# Patient Record
Sex: Male | Born: 1935 | Race: White | Hispanic: No | Marital: Married | Smoking: Never smoker
Health system: Southern US, Community
[De-identification: ages and names within clinical notes are randomized; demographics above are authoritative.]

## PROBLEM LIST (undated history)

## (undated) DIAGNOSIS — I495 Sick sinus syndrome: Secondary | ICD-10-CM

## (undated) DIAGNOSIS — K8689 Other specified diseases of pancreas: Secondary | ICD-10-CM

## (undated) DIAGNOSIS — M109 Gout, unspecified: Secondary | ICD-10-CM

## (undated) DIAGNOSIS — G629 Polyneuropathy, unspecified: Secondary | ICD-10-CM

## (undated) DIAGNOSIS — I739 Peripheral vascular disease, unspecified: Secondary | ICD-10-CM

## (undated) DIAGNOSIS — N189 Chronic kidney disease, unspecified: Secondary | ICD-10-CM

## (undated) DIAGNOSIS — Z950 Presence of cardiac pacemaker: Secondary | ICD-10-CM

## (undated) DIAGNOSIS — I10 Essential (primary) hypertension: Secondary | ICD-10-CM

## (undated) DIAGNOSIS — E039 Hypothyroidism, unspecified: Secondary | ICD-10-CM

## (undated) DIAGNOSIS — K219 Gastro-esophageal reflux disease without esophagitis: Secondary | ICD-10-CM

## (undated) DIAGNOSIS — E119 Type 2 diabetes mellitus without complications: Secondary | ICD-10-CM

## (undated) DIAGNOSIS — C801 Malignant (primary) neoplasm, unspecified: Secondary | ICD-10-CM

## (undated) DIAGNOSIS — I499 Cardiac arrhythmia, unspecified: Secondary | ICD-10-CM

## (undated) DIAGNOSIS — Z8619 Personal history of other infectious and parasitic diseases: Secondary | ICD-10-CM

## (undated) HISTORY — DX: Personal history of other infectious and parasitic diseases: Z86.19

## (undated) HISTORY — PX: EYE SURGERY: SHX253

## (undated) HISTORY — PX: ANGIOPLASTY: SHX39

## (undated) HISTORY — PX: SKIN CANCER EXCISION: SHX779

---

## 1960-09-13 HISTORY — PX: APPENDECTOMY: SHX54

## 2007-09-14 DIAGNOSIS — D751 Secondary polycythemia: Secondary | ICD-10-CM | POA: Insufficient documentation

## 2007-09-15 ENCOUNTER — Ambulatory Visit: Payer: Self-pay | Admitting: Family Medicine

## 2008-03-29 ENCOUNTER — Ambulatory Visit: Payer: Self-pay | Admitting: Family Medicine

## 2008-04-08 ENCOUNTER — Ambulatory Visit: Payer: Self-pay | Admitting: Family Medicine

## 2008-07-30 ENCOUNTER — Ambulatory Visit: Payer: Self-pay | Admitting: Vascular Surgery

## 2008-09-02 ENCOUNTER — Ambulatory Visit: Payer: Self-pay | Admitting: Family Medicine

## 2008-09-13 DIAGNOSIS — IMO0002 Reserved for concepts with insufficient information to code with codable children: Secondary | ICD-10-CM | POA: Insufficient documentation

## 2008-12-27 DIAGNOSIS — I509 Heart failure, unspecified: Secondary | ICD-10-CM | POA: Insufficient documentation

## 2009-01-01 DIAGNOSIS — M109 Gout, unspecified: Secondary | ICD-10-CM | POA: Insufficient documentation

## 2009-04-07 ENCOUNTER — Ambulatory Visit: Payer: Self-pay | Admitting: Vascular Surgery

## 2009-05-20 DIAGNOSIS — M545 Low back pain, unspecified: Secondary | ICD-10-CM | POA: Insufficient documentation

## 2009-12-22 ENCOUNTER — Inpatient Hospital Stay: Payer: Self-pay | Admitting: Vascular Surgery

## 2012-04-19 ENCOUNTER — Ambulatory Visit: Payer: Self-pay | Admitting: Family Medicine

## 2013-05-13 ENCOUNTER — Inpatient Hospital Stay: Payer: Self-pay | Admitting: Internal Medicine

## 2013-05-13 LAB — COMPREHENSIVE METABOLIC PANEL
Albumin: 3.7 g/dL (ref 3.4–5.0)
Alkaline Phosphatase: 67 U/L (ref 50–136)
Anion Gap: 4 — ABNORMAL LOW (ref 7–16)
Bilirubin,Total: 1 mg/dL (ref 0.2–1.0)
Calcium, Total: 9.3 mg/dL (ref 8.5–10.1)
Creatinine: 1.93 mg/dL — ABNORMAL HIGH (ref 0.60–1.30)
EGFR (African American): 36 — ABNORMAL LOW
EGFR (Non-African Amer.): 31 — ABNORMAL LOW
Glucose: 135 mg/dL — ABNORMAL HIGH (ref 65–99)
Potassium: 4.7 mmol/L (ref 3.5–5.1)
SGPT (ALT): 18 U/L (ref 12–78)

## 2013-05-13 LAB — CBC
HCT: 47.9 % (ref 40.0–52.0)
HGB: 16.1 g/dL (ref 13.0–18.0)
MCHC: 33.7 g/dL (ref 32.0–36.0)
MCV: 96 fL (ref 80–100)
Platelet: 140 10*3/uL — ABNORMAL LOW (ref 150–440)
RBC: 4.99 10*6/uL (ref 4.40–5.90)
WBC: 11.5 10*3/uL — ABNORMAL HIGH (ref 3.8–10.6)

## 2013-05-13 LAB — URINALYSIS, COMPLETE
Glucose,UR: NEGATIVE mg/dL (ref 0–75)
Hyaline Cast: 11
Nitrite: NEGATIVE
Protein: 100
RBC,UR: 7 /HPF (ref 0–5)
WBC UR: 4 /HPF (ref 0–5)

## 2013-05-13 LAB — LIPASE, BLOOD: Lipase: 413 U/L — ABNORMAL HIGH (ref 73–393)

## 2013-05-14 LAB — COMPREHENSIVE METABOLIC PANEL
Albumin: 2.8 g/dL — ABNORMAL LOW (ref 3.4–5.0)
BUN: 28 mg/dL — ABNORMAL HIGH (ref 7–18)
Bilirubin,Total: 0.8 mg/dL (ref 0.2–1.0)
Co2: 31 mmol/L (ref 21–32)
EGFR (African American): 40 — ABNORMAL LOW
EGFR (Non-African Amer.): 34 — ABNORMAL LOW
Glucose: 114 mg/dL — ABNORMAL HIGH (ref 65–99)
Osmolality: 275 (ref 275–301)
SGPT (ALT): 15 U/L (ref 12–78)
Sodium: 134 mmol/L — ABNORMAL LOW (ref 136–145)
Total Protein: 6 g/dL — ABNORMAL LOW (ref 6.4–8.2)

## 2013-05-14 LAB — CBC WITH DIFFERENTIAL/PLATELET
Basophil #: 0 10*3/uL (ref 0.0–0.1)
Basophil %: 0.4 %
Eosinophil %: 0.5 %
HCT: 41 % (ref 40.0–52.0)
Lymphocyte #: 0.6 10*3/uL — ABNORMAL LOW (ref 1.0–3.6)
MCV: 96 fL (ref 80–100)
Monocyte #: 0.7 x10 3/mm (ref 0.2–1.0)
Monocyte %: 9.6 %
Neutrophil %: 81.6 %
Platelet: 105 10*3/uL — ABNORMAL LOW (ref 150–440)
RBC: 4.3 10*6/uL — ABNORMAL LOW (ref 4.40–5.90)
RDW: 15 % — ABNORMAL HIGH (ref 11.5–14.5)

## 2013-05-14 LAB — TROPONIN I: Troponin-I: 0.07 ng/mL — ABNORMAL HIGH

## 2013-05-16 LAB — CANCER ANTIGEN 19-9: CA 19-9: 11 U/mL (ref 0–35)

## 2013-09-13 HISTORY — PX: CAROTID ENDARTERECTOMY: SUR193

## 2013-09-13 HISTORY — PX: CATARACT EXTRACTION: SUR2

## 2013-09-13 HISTORY — PX: OTHER SURGICAL HISTORY: SHX169

## 2013-09-13 HISTORY — PX: CORONARY ANGIOPLASTY: SHX604

## 2014-07-09 ENCOUNTER — Ambulatory Visit: Payer: Self-pay | Admitting: Vascular Surgery

## 2014-07-21 ENCOUNTER — Emergency Department: Payer: Self-pay | Admitting: Emergency Medicine

## 2014-07-21 LAB — CBC WITH DIFFERENTIAL/PLATELET
Basophil #: 0 10*3/uL (ref 0.0–0.1)
Basophil %: 0.6 %
EOS PCT: 0.9 %
Eosinophil #: 0.1 10*3/uL (ref 0.0–0.7)
HCT: 45.6 % (ref 40.0–52.0)
HGB: 14.9 g/dL (ref 13.0–18.0)
Lymphocyte #: 0.8 10*3/uL — ABNORMAL LOW (ref 1.0–3.6)
Lymphocyte %: 13.5 %
MCH: 32.8 pg (ref 26.0–34.0)
MCHC: 32.6 g/dL (ref 32.0–36.0)
MCV: 101 fL — ABNORMAL HIGH (ref 80–100)
MONOS PCT: 6.3 %
Monocyte #: 0.4 x10 3/mm (ref 0.2–1.0)
NEUTROS PCT: 78.7 %
Neutrophil #: 4.9 10*3/uL (ref 1.4–6.5)
Platelet: 161 10*3/uL (ref 150–440)
RBC: 4.53 10*6/uL (ref 4.40–5.90)
RDW: 14 % (ref 11.5–14.5)
WBC: 6.2 10*3/uL (ref 3.8–10.6)

## 2014-07-21 LAB — COMPREHENSIVE METABOLIC PANEL
ALK PHOS: 77 U/L
AST: 22 U/L (ref 15–37)
Albumin: 3.7 g/dL (ref 3.4–5.0)
Anion Gap: 6 — ABNORMAL LOW (ref 7–16)
BUN: 23 mg/dL — ABNORMAL HIGH (ref 7–18)
Bilirubin,Total: 0.5 mg/dL (ref 0.2–1.0)
CALCIUM: 8.2 mg/dL — AB (ref 8.5–10.1)
Chloride: 94 mmol/L — ABNORMAL LOW (ref 98–107)
Co2: 35 mmol/L — ABNORMAL HIGH (ref 21–32)
Creatinine: 2.26 mg/dL — ABNORMAL HIGH (ref 0.60–1.30)
EGFR (African American): 36 — ABNORMAL LOW
EGFR (Non-African Amer.): 29 — ABNORMAL LOW
GLUCOSE: 278 mg/dL — AB (ref 65–99)
Osmolality: 284 (ref 275–301)
Potassium: 4.2 mmol/L (ref 3.5–5.1)
SGPT (ALT): 24 U/L
SODIUM: 135 mmol/L — AB (ref 136–145)
Total Protein: 6.6 g/dL (ref 6.4–8.2)

## 2014-07-21 LAB — URINALYSIS, COMPLETE
BACTERIA: NONE SEEN
BILIRUBIN, UR: NEGATIVE
Blood: NEGATIVE
Ketone: NEGATIVE
Leukocyte Esterase: NEGATIVE
Nitrite: NEGATIVE
Ph: 6 (ref 4.5–8.0)
Protein: NEGATIVE
RBC,UR: 1 /HPF (ref 0–5)
SQUAMOUS EPITHELIAL: NONE SEEN
Specific Gravity: 1.01 (ref 1.003–1.030)
WBC UR: 2 /HPF (ref 0–5)

## 2014-07-21 LAB — LIPASE, BLOOD: Lipase: 217 U/L (ref 73–393)

## 2014-07-21 LAB — TROPONIN I: Troponin-I: 0.07 ng/mL — ABNORMAL HIGH

## 2014-07-29 ENCOUNTER — Ambulatory Visit: Payer: Self-pay | Admitting: Cardiology

## 2014-08-07 ENCOUNTER — Ambulatory Visit: Payer: Self-pay | Admitting: Vascular Surgery

## 2014-08-07 LAB — POTASSIUM: Potassium: 4.7 mmol/L (ref 3.5–5.1)

## 2014-08-15 ENCOUNTER — Inpatient Hospital Stay: Payer: Self-pay | Admitting: Vascular Surgery

## 2014-08-16 LAB — BASIC METABOLIC PANEL
Anion Gap: 8 (ref 7–16)
BUN: 31 mg/dL — AB (ref 7–18)
CO2: 30 mmol/L (ref 21–32)
CREATININE: 2.16 mg/dL — AB (ref 0.60–1.30)
Calcium, Total: 7.9 mg/dL — ABNORMAL LOW (ref 8.5–10.1)
Chloride: 98 mmol/L (ref 98–107)
EGFR (African American): 37 — ABNORMAL LOW
GFR CALC NON AF AMER: 31 — AB
Glucose: 203 mg/dL — ABNORMAL HIGH (ref 65–99)
Osmolality: 284 (ref 275–301)
POTASSIUM: 4.8 mmol/L (ref 3.5–5.1)
SODIUM: 136 mmol/L (ref 136–145)

## 2014-08-16 LAB — CBC WITH DIFFERENTIAL/PLATELET
Basophil #: 0 10*3/uL (ref 0.0–0.1)
Basophil %: 0.3 %
EOS ABS: 0 10*3/uL (ref 0.0–0.7)
Eosinophil %: 0.4 %
HCT: 40.6 % (ref 40.0–52.0)
HGB: 13 g/dL (ref 13.0–18.0)
Lymphocyte #: 0.5 10*3/uL — ABNORMAL LOW (ref 1.0–3.6)
Lymphocyte %: 7.1 %
MCH: 32.5 pg (ref 26.0–34.0)
MCHC: 31.9 g/dL — ABNORMAL LOW (ref 32.0–36.0)
MCV: 102 fL — ABNORMAL HIGH (ref 80–100)
Monocyte #: 0.5 x10 3/mm (ref 0.2–1.0)
Monocyte %: 6.9 %
Neutrophil #: 6.4 10*3/uL (ref 1.4–6.5)
Neutrophil %: 85.3 %
Platelet: 102 10*3/uL — ABNORMAL LOW (ref 150–440)
RBC: 3.99 10*6/uL — AB (ref 4.40–5.90)
RDW: 13.8 % (ref 11.5–14.5)
WBC: 7.5 10*3/uL (ref 3.8–10.6)

## 2014-08-16 LAB — PROTIME-INR
INR: 1
Prothrombin Time: 13.5 secs (ref 11.5–14.7)

## 2014-08-16 LAB — APTT: Activated PTT: 29.7 secs (ref 23.6–35.9)

## 2014-11-06 DIAGNOSIS — D0421 Carcinoma in situ of skin of right ear and external auricular canal: Secondary | ICD-10-CM | POA: Diagnosis not present

## 2014-11-06 DIAGNOSIS — L570 Actinic keratosis: Secondary | ICD-10-CM | POA: Diagnosis not present

## 2014-11-06 DIAGNOSIS — D099 Carcinoma in situ, unspecified: Secondary | ICD-10-CM | POA: Diagnosis not present

## 2014-11-06 DIAGNOSIS — L98499 Non-pressure chronic ulcer of skin of other sites with unspecified severity: Secondary | ICD-10-CM | POA: Diagnosis not present

## 2014-11-06 DIAGNOSIS — D485 Neoplasm of uncertain behavior of skin: Secondary | ICD-10-CM | POA: Diagnosis not present

## 2014-11-12 DIAGNOSIS — I509 Heart failure, unspecified: Secondary | ICD-10-CM | POA: Diagnosis not present

## 2014-11-12 DIAGNOSIS — I10 Essential (primary) hypertension: Secondary | ICD-10-CM | POA: Diagnosis not present

## 2014-11-12 DIAGNOSIS — N183 Chronic kidney disease, stage 3 (moderate): Secondary | ICD-10-CM | POA: Diagnosis not present

## 2014-11-12 DIAGNOSIS — E1149 Type 2 diabetes mellitus with other diabetic neurological complication: Secondary | ICD-10-CM | POA: Diagnosis not present

## 2014-11-12 DIAGNOSIS — E1140 Type 2 diabetes mellitus with diabetic neuropathy, unspecified: Secondary | ICD-10-CM | POA: Diagnosis not present

## 2014-11-13 DIAGNOSIS — E039 Hypothyroidism, unspecified: Secondary | ICD-10-CM | POA: Diagnosis not present

## 2014-11-13 DIAGNOSIS — N183 Chronic kidney disease, stage 3 (moderate): Secondary | ICD-10-CM | POA: Diagnosis not present

## 2014-11-13 DIAGNOSIS — E780 Pure hypercholesterolemia: Secondary | ICD-10-CM | POA: Diagnosis not present

## 2014-11-13 LAB — LIPID PANEL
Cholesterol: 132 mg/dL (ref 0–200)
HDL: 46 mg/dL (ref 35–70)
LDL Cholesterol: 60 mg/dL
Triglycerides: 131 mg/dL (ref 40–160)

## 2014-11-13 LAB — BASIC METABOLIC PANEL
BUN: 24 mg/dL — AB (ref 4–21)
Creatinine: 1.8 mg/dL — AB (ref 0.6–1.3)
Glucose: 151 mg/dL
Sodium: 139 mmol/L (ref 137–147)

## 2014-11-13 LAB — TSH: TSH: 4.59 u[IU]/mL (ref 0.41–5.90)

## 2015-01-01 DIAGNOSIS — D0421 Carcinoma in situ of skin of right ear and external auricular canal: Secondary | ICD-10-CM | POA: Diagnosis not present

## 2015-01-01 DIAGNOSIS — L578 Other skin changes due to chronic exposure to nonionizing radiation: Secondary | ICD-10-CM | POA: Diagnosis not present

## 2015-01-01 DIAGNOSIS — D044 Carcinoma in situ of skin of scalp and neck: Secondary | ICD-10-CM | POA: Diagnosis not present

## 2015-01-01 DIAGNOSIS — D485 Neoplasm of uncertain behavior of skin: Secondary | ICD-10-CM | POA: Diagnosis not present

## 2015-01-01 DIAGNOSIS — L570 Actinic keratosis: Secondary | ICD-10-CM | POA: Diagnosis not present

## 2015-01-03 NOTE — H&P (Signed)
PATIENT NAME:  [name] MR#:  [id number] DATE OF BIRTH:  06/23/1925  DATE OF ADMISSION:  05/13/2013  PRIMARY CARE PHYSICIAN: Dr. Caryn Section.  CHIEF COMPLAINT: Abdominal pain.   HISTORY OF PRESENT ILLNESS: This is an 81 year old male who Friday started having some epigastric pain and bloating. He had no nausea and vomiting with this. He took extra Zantac, with little relief. It was worse today. He comes in. He was found to have pancreatitis by a CT scan and labs.   He has had no history of this before. He does not drink alcohol, and no history of gallstones. We are going to admit him for further treatment.   PAST MEDICAL HISTORY: 1.  Peripheral vascular disease.  2.  Non-insulin-dependent diabetes.  3.  Gout.  4.  Hypertension.  5.  Hyperlipidemia.  6.  Hypothyroidism.  7.  GERD.   PAST SURGICAL HISTORY: Left popliteal stent, and appendectomy.   ALLERGIES: PENICILLIN.   SOCIAL HISTORY: Does not smoke and does not drink alcohol.   FAMILY HISTORY: Significant for diabetes.   REVIEW OF SYSTEMS:  CONSTITUTIONAL: No fever or chills.  EYES: No blurred vision.  ENT: No hearing loss.  CARDIOVASCULAR: No chest pain.  PULMONARY: No shortness of breath.  GASTROINTESTINAL: He has had abdominal pain.  GENITOURINARY: No dysuria.  ENDOCRINE: No heat or cold intolerance.  INTEGUMENTARY: No rash.  MUSCULOSKELETAL: Occasional joint pain.  NEUROLOGIC: No numbness or weakness.   PHYSICAL EXAMINATION: VITAL SIGNS: Temperature is 98, pulse 85, respirations 16, blood pressure 135/81.  GENERAL: This is a well-nourished white male in no acute distress.  HEENT: The pupils are equal, round, and reactive to light. Sclerae nonicteric. Oral mucosa is moist. Oropharynx clear. Nasopharynx is clear.  NECK: Supple. No JVD, lymphadenopathy or thyromegaly.  CARDIOVASCULAR: Regular rate and rhythm. There is a 1/6 systolic murmur.  LUNGS: Clear to auscultation. No dullness to percussion. He is not using  accessory muscles.  ABDOMEN: Mildly distended. There is some tenderness in the epigastric area. No rebound or guarding. There is no hepatosplenomegaly. No masses.  EXTREMITIES: There is trace lower extremity edema. No joint deformity.  NEUROLOGIC: Cranial nerves II-XII are intact. He is alert and oriented x 4.  SKIN: Moist, with no rash.   CT scan shows pancreatitis that was done without contrast.   White blood cells 11.5, hemoglobin 16, BUN is 32, creatinine 1.93, sodium 133, lipase is 413, troponin 0.06.   EKG shows a left bundle branch block, and possible a-fib.   ASSESSMENT AND PLAN: 1.  Acute pancreatitis: Etiology is unknown at this point. He does not have a history of alcohol use and has never had gallstones. CT scan did not show any gallbladder abnormalities, but it was done without contrast. Will go ahead and treat him aggressively with IV fluids; pain medicines if needed. Make him n.p.o. If his renal function improves will consider doing a CT scan with contrast to further evaluate. Have to be concerned about pancreatic cancer in this age group.  2.  Acute renal failure: This is likely dehydration. Will hydrate him with IV fluids. I am going to hold his metformin, his hydrochlorothiazide, and his ACE inhibitor at this point.  3.  Abnormal EKG: The patient states he has had an abnormal EKG for years. I do not have an old one to compare to. I do not see anything acute on this EKG at this point. His troponin is just barely above normal limits. Will put him on telemetry and  go ahead and cycle some enzymes. Will try get an old EKG from his primary care doctor's office. Will consult cardiology if needed.  4.  Noninsulin-dependent diabetes: Holding his diabetic medications for now, and will put him on sliding-scale.   Time spent on admission was sixty minutes.     ____________________________ Baxter Hire, MD jdj:dm D: 05/13/2013 22:41:01 ET T: 05/14/2013 08:10:24  ET JOB#: [id number]  cc: Baxter Hire, MD, <Dictator> Kirstie Peri. Caryn Section, MD Baxter Hire MD ELECTRONICALLY SIGNED 05/23/2013 9:42

## 2015-01-03 NOTE — Discharge Summary (Signed)
PATIENT NAME:  [name], [name] MR#:  [id number] DATE OF BIRTH:  1925/06/17  DATE OF ADMISSION:  05/13/2013 DATE OF DISCHARGE:  05/15/2013  DISCHARGE DIAGNOSES: 1.  Acute pancreatitis, idiopathic origin.  2.  Chronic kidney disease stage III.  3.  Peripheral vascular disease.  4.  Insulin-dependent diabetes mellitus.  5.  Hypertension.  6.  Hyperlipidemia.  7.  Hypothyroidism.  8.  Gastroesophageal reflux disease.  9.  Abnormal EKG which is chronic.  DISCHARGE MEDICATIONS:  Allopurinol 100 mg p.o. daily, aspirin 81 mg p.o. daily, bumetanide 2 mg 2 tablets as needed for swelling, fish oil 2 grams daily, Neurontin 300 mg at bedtime, garlic 1 gram daily, glipizide 2.5 mg p.o. b.i.d., HCTZ/lisinopril 25/20 half tablet once a day, levothyroxine 112 mcg p.o. daily, Plavix 75 mg p.o. daily, pravastatin 20 mg p.o. daily, ranitidine 150 mg p.o. b.i.d., Singulair 10 mg p.o. daily, vitamin B12, 1000 mcg injection  every month. Metformin has been stopped because of CKD stage III.  HOSPITAL COURSE:  1.  This is an 81 year old male patient with history of hypertension, diabetes, CKD stage III, comes in because of abdominal pain and bloating. The patient has been having these symptoms for 3 days, and the pain was around 10 out of 10 in severity, epigastric pain, but no radiation to the back The patient's CAT scan of the abdomen showed pancreatitis, but no gallstones. The patient's lipase on admission was 413. The patient was admitted to the hospitalist service for pancreatitis. He had LFTs, which were within normal limits. A, AST 23, and his  totAL BILIRUBIN l was 1.   ct abdomen  showed 1. Findings suggestive of acute pancreatitis. There may be some  underlying gastritis or thickening the stomach secondary to pancreatitis.  Atherosclerotic disease with infrarenal suprailiac abdominal aortic  aneurysm. Fat filled right inguinal hernia. Scattered colonic  diverticulosis without definite evidence of acute  diverticulitis.    The patient's symptoms improved with n.p.o., IV fluid status. Lipase came back normal at 374 on September 1st. Soft diet. The patient initially started on liquid diet yesterday morning on September 1st and he advanced to regular diet, tolerating the diet. Did not have any more pain. No nausea. The patient really wants to go home, and initially the patient's  white count was 11.5, but today it is 7.1.  The cause of pancreatitis is unclear at this time, because he has no gallstones. The patient's lipid panel ordered and also CA 99 is ordered to evaluate for malignancy at this age, but they are pending. Anyway, the patient is doing better. The patient can have the followup with the primary doctor as an outpatient for these problems.  2.  Chronic kidney disease stage III.  The patient is not aware of any kidney problem, but the patient's  GFR is around 30 and BUN 32 and creatinine 1.93 on admission. Today, it is 28 and 1.75.  He has acute on chronic renal failure secondary to dehydration but he has CKD stage III, diabetes and hypertension.  3.  Abnormal EKG. The patient's EKG was questionable for any conduction abnormalities on admission. EKG results from his primary doctor obtained from Dr. Maralyn Sago office.  There are 2  EKGs, one done on August of last year which showed same findings like the one that he had in the hospital. The patient did have a left bundle branch block. The EKG here also showed LBB. The patient mentioned the patient does have some problems like that, but  never had chest pain and was evaluated by cardiologist before for this, and the patient's echo was normal. According to him, he did not have any intervention done and this conduction abnormality is not new for him. Troponins were 0.07 and then the second one 0.06. The patient had no chest pain, today symptom-free. No abdominal pain. The patient is tolerating the diet well. I told him to stop the metformin because of his age  and CKD stage III. He can continue glipizide and follow with his primary doctor regarding blood sugar control and management. The patient's condition is stable.   TIME SPENT ON DISCHARGE PREPARATION: More than 30 minutes.   VITAL SIGNS:  Stable at the time of discharge.   ____________________________ Epifanio Lesches, MD sk:dmm D: 05/15/2013 11:51:48 ET T: 05/15/2013 12:20:33 ET JOB#: [id number]  cc: Epifanio Lesches, MD, <Dictator> Epifanio Lesches MD ELECTRONICALLY SIGNED 05/31/2013 23:10

## 2015-01-04 NOTE — Op Note (Signed)
PATIENT NAME:  [name], [name] MR#:  [id number] DATE OF BIRTH:  05-09-1925  DATE OF PROCEDURE:  08/15/2014  PREOPERATIVE DIAGNOSES:  1. High-grade bilateral carotid artery stenosis, right greater than left.  2. Peripheral arterial disease.  3. Coronary disease.  4. Hyperlipidemia.  5. Hypertension.  6. Diabetes.   POSTOPERATIVE DIAGNOSES: 1. High-grade bilateral carotid artery stenosis, right greater than left.  2. Peripheral arterial disease.  3. Coronary disease.  4. Hyperlipidemia.  5. Hypertension.  6. Diabetes.   PROCEDURE: Right carotid endarterectomy.   SURGEON: [name], MD   ANESTHESIA: General.   ESTIMATED BLOOD LOSS: 25 mL.   INDICATIONS FOR PROCEDURE: This is an 81 year old gentleman with recent findings of bilateral high-grade carotid artery stenosis. The right side is the worse of the 2 sides. The left side is on the borderline of high-grade for moderate disease. He does not have any focal symptoms. He is brought in for endarterectomy on the right side for his very high-grade stenosis, and pending how he does through this, we may consider endarterectomy on the left side in the future as well. Risks and benefits were discussed. Informed consent was obtained.   DESCRIPTION OF PROCEDURE: The patient is brought to the operative suite and after an adequate level of general anesthesia was obtained, the patient was placed in modified beach chair position. A roll was placed under his shoulders, his head was flexed and turned to the left. His neck was then sterilely prepped and draped, and a sterile surgical field was created. An incision was created along the anterior border of the sternocleidomastoid and dissected down through the platysma with electrocautery. The sternocleidomastoid was then retracted laterally. Weitlaner retractor was used to help facilitate our exposure. The facial vein was ligated and divided between silk ties as well as a crossing superficial vein. This  exposed the carotid bifurcation. The internal carotid artery distal to the lesion, external carotid artery, superior thyroid artery and common carotid artery were all dissected out and encircled with vessel loops. The patient was given 6000 units of intravenous heparin for systemic anticoagulation. This was allowed to circulate for 4 minutes. Control was then pulled up on the vessel loops, and an anterior wall arteriotomy was created with an 11 blade and extended with Potts scissors. The Pru[place] shunt was then placed, first in the internal carotid artery, flushed and de-aired, then in the common carotid artery; and flow was then restored. Between 60 and 90 seconds elapsed between clamping and restoration of flow.  I then performed an endarterectomy in the typical fashion. Eversion endarterectomy was performed on the external carotid artery. The proximal endpoint was cut flush with tenotomy scissors. A nice feathered distal endpoint was created with gentle traction. All loose flecks were removed. The vessel was locally heparinized. Due to the generous nature of his artery and his advanced age, I felt a primary closure would be warranted. I tacked down the distal endpoint with two 7-0 Prolene and started the distal endpoint of the arteriotomy with a 6-0 Prolene, running one-half the length of the arteriotomy. A second 6-0 Prolene was started at the proximal endpoint and run until the shunt needed to be removed. The shunt was then removed first in the internal and then from the common carotid arteries. The vessel was flushed in the internal, external and common carotid arteries and locally heparinized. I then completed the arteriotomy, flushing through the external carotid artery and allowing several cardiac cycles to traverse up the external carotid artery  prior to release of control. Approximately 2 minutes elapsed from shunt removal to restoration of flow to the brain. A single 6-0 Prolene patch suture was  used for hemostasis, and hemostasis was achieved. The wound was then irrigated. Surgicel and Evicel topical hemostatic agents were placed. I then closed the wound with 3 interrupted 3-0 Vicryl sutures in the sternocleidomastoid space. The platysma was closed with a running 3-0 Vicryl. The skin was closed with a 4-0 Monocryl. Dermabond was placed as a dressing. The patient was awakened from anesthesia and taken to the recovery room in stable condition, having tolerated the procedure well.    ____________________________ [name], MD jsd:je D: 08/15/2014 14:54:45 ET T: 08/15/2014 15:17:51 ET JOB#: [id number]  cc: [name], MD, <Dictator> Kirstie Peri. Caryn Section, MD [name] MD ELECTRONICALLY SIGNED 09/12/2014 14:13

## 2015-01-04 NOTE — Consult Note (Signed)
   Present Illness 81 yo male with history of htn, hyperlipidemia, DM who is s/p R CEA. Consultation requested to evaluate irregular heart rate.Preop, underwent a functional study followed by cardiac cath which revealed no evidence of significant coronary disease. He had irregular heart rate as outpatient with pacs, pvcx etc. He had realtive bradycardia as outpatient as well. He denies syncope. Post op has done well other than frequent ectopy on monitor. Asymptomatic at present. Deneis chest pain or dizzyness.   Physical Exam:  GEN no acute distress   HEENT PERRL   RESP normal resp effort  clear BS   CARD Irregular rate and rhythm  Murmur   Murmur Systolic   Systolic Murmur axilla   ABD denies tenderness  normal BS   LYMPH negative neck   EXTR negative cyanosis/clubbing, negative edema   SKIN normal to palpation   NEURO cranial nerves intact, motor/sensory function intact   PSYCH A+O to time, place, person   Review of Systems:  Subjective/Chief Complaint mild neck discomfort at surgical site.   General: No Complaints   Skin: No Complaints   ENT: No Complaints   Eyes: No Complaints   Neck: Pain  at surgical site.   Respiratory: No Complaints   Cardiovascular: Palpitations   Gastrointestinal: No Complaints   Genitourinary: No Complaints   Vascular: No Complaints   Musculoskeletal: No Complaints   Neurologic: No Complaints   Hematologic: No Complaints   Endocrine: No Complaints   Psychiatric: No Complaints   Review of Systems: All other systems were reviewed and found to be negative   Medications/Allergies Reviewed Medications/Allergies reviewed   EKG:  Interpretation sinus arrythmia, pacs and pvcs.    PCN: Rash  Penicillin: Swelling   Impression 81  yo male with history of insignficant cad by recent cath who is s/p r cea. Realtive hypotension with frequent ectopy on ekg and telemetry. Has no symptoms at present. Had similar findings on ekg and  monitors as outpatient. K is normal. WOuld agree with discharge with no further cardiac workup. Can folow up in our office after discharge. OK for discharge from cardiac standpoint.   Plan 1. Agree with holding ace i for now. WIll likel need this back after discharge and recovery from surgery 2. OK for discharge from cardiac standpoint 3. WIll follow up as outpatient if desired.   Electronic Signatures: [name] (MD)  (Signed 04-Dec-15 12:49)  Authored: General Aspect/Present Illness, History and Physical Exam, Review of System, EKG , Allergies, Impression/Plan   Last Updated: 04-Dec-15 12:49 by [name] (MD)

## 2015-01-06 LAB — SURGICAL PATHOLOGY

## 2015-01-10 DIAGNOSIS — E1149 Type 2 diabetes mellitus with other diabetic neurological complication: Secondary | ICD-10-CM | POA: Diagnosis not present

## 2015-01-10 DIAGNOSIS — L98499 Non-pressure chronic ulcer of skin of other sites with unspecified severity: Secondary | ICD-10-CM | POA: Diagnosis not present

## 2015-01-13 DIAGNOSIS — Z85828 Personal history of other malignant neoplasm of skin: Secondary | ICD-10-CM | POA: Diagnosis not present

## 2015-01-13 DIAGNOSIS — L905 Scar conditions and fibrosis of skin: Secondary | ICD-10-CM | POA: Diagnosis not present

## 2015-01-21 DIAGNOSIS — I10 Essential (primary) hypertension: Secondary | ICD-10-CM | POA: Diagnosis not present

## 2015-01-21 DIAGNOSIS — E509 Vitamin A deficiency, unspecified: Secondary | ICD-10-CM | POA: Diagnosis not present

## 2015-01-21 DIAGNOSIS — I739 Peripheral vascular disease, unspecified: Secondary | ICD-10-CM | POA: Diagnosis not present

## 2015-01-21 DIAGNOSIS — N183 Chronic kidney disease, stage 3 (moderate): Secondary | ICD-10-CM | POA: Diagnosis not present

## 2015-01-21 DIAGNOSIS — Z0000 Encounter for general adult medical examination without abnormal findings: Secondary | ICD-10-CM | POA: Diagnosis not present

## 2015-01-21 DIAGNOSIS — I509 Heart failure, unspecified: Secondary | ICD-10-CM | POA: Diagnosis not present

## 2015-01-29 ENCOUNTER — Other Ambulatory Visit
Admission: RE | Admit: 2015-01-29 | Discharge: 2015-01-29 | Disposition: A | Discharge status: Home / Self Care | Payer: Medicare Other | Source: Ambulatory Visit | Attending: Surgery | Admitting: Surgery

## 2015-01-29 ENCOUNTER — Encounter: Payer: Medicare Other | Attending: Surgery | Admitting: Surgery

## 2015-01-29 DIAGNOSIS — I509 Heart failure, unspecified: Secondary | ICD-10-CM | POA: Diagnosis not present

## 2015-01-29 DIAGNOSIS — E1122 Type 2 diabetes mellitus with diabetic chronic kidney disease: Secondary | ICD-10-CM | POA: Insufficient documentation

## 2015-01-29 DIAGNOSIS — I129 Hypertensive chronic kidney disease with stage 1 through stage 4 chronic kidney disease, or unspecified chronic kidney disease: Secondary | ICD-10-CM | POA: Diagnosis not present

## 2015-01-29 DIAGNOSIS — X58XXXD Exposure to other specified factors, subsequent encounter: Secondary | ICD-10-CM | POA: Insufficient documentation

## 2015-01-29 DIAGNOSIS — S81801D Unspecified open wound, right lower leg, subsequent encounter: Secondary | ICD-10-CM | POA: Insufficient documentation

## 2015-01-29 DIAGNOSIS — R600 Localized edema: Secondary | ICD-10-CM | POA: Diagnosis not present

## 2015-01-29 DIAGNOSIS — E11622 Type 2 diabetes mellitus with other skin ulcer: Secondary | ICD-10-CM | POA: Insufficient documentation

## 2015-01-29 DIAGNOSIS — E1143 Type 2 diabetes mellitus with diabetic autonomic (poly)neuropathy: Secondary | ICD-10-CM | POA: Insufficient documentation

## 2015-01-29 DIAGNOSIS — I70632 Atherosclerosis of nonbiological bypass graft(s) of the right leg with ulceration of calf: Secondary | ICD-10-CM | POA: Insufficient documentation

## 2015-01-29 DIAGNOSIS — I482 Chronic atrial fibrillation: Secondary | ICD-10-CM | POA: Diagnosis not present

## 2015-01-29 DIAGNOSIS — L03115 Cellulitis of right lower limb: Secondary | ICD-10-CM | POA: Insufficient documentation

## 2015-01-29 DIAGNOSIS — L97219 Non-pressure chronic ulcer of right calf with unspecified severity: Secondary | ICD-10-CM | POA: Diagnosis not present

## 2015-01-29 DIAGNOSIS — I739 Peripheral vascular disease, unspecified: Secondary | ICD-10-CM | POA: Diagnosis not present

## 2015-01-30 DIAGNOSIS — L97909 Non-pressure chronic ulcer of unspecified part of unspecified lower leg with unspecified severity: Secondary | ICD-10-CM | POA: Diagnosis not present

## 2015-01-30 NOTE — Progress Notes (Signed)
MIRL, HILLERY ([id number]) Visit Report for 01/29/2015 Chief Complaint Document Details Patient Name: [name], [name]. Date of Service: 01/29/2015 2:30 PM Medical Record Number: [id number] Patient Account Number: [id number] Date of Birth/Sex: 1924-09-28 (81 y.o. Male) Treating RN: Primary Care Physician: Lelon Huh Other Clinician: Referring Physician: Treating Physician/Extender: BURNS, Charlean Sanfilippo in Treatment: 0 Information Obtained from: Patient Chief Complaint Right calf ulcer. Arterial insufficiency. Electronic Signature(s) Signed: 01/29/2015 1:50:02 PM By: Loletha Grayer MD Entered By: Loletha Grayer on 01/29/2015 15:41:03 [name], [name] ([id number]) -------------------------------------------------------------------------------- Debridement Details Patient Name: [name]. Date of Service: 01/29/2015 2:30 PM Medical Record Number: [id number] Patient Account Number: [id number] Date of Birth/Sex: [birth year] (81 y.o. Male) Treating RN: Primary Care Physician: Lelon Huh Other Clinician: Referring Physician: Treating Physician/Extender: BURNS, Charlean Sanfilippo in Treatment: 0 Debridement Performed for Wound #1 Right,Medial Lower Leg Assessment: Performed By: Physician BURNS, Teressa Senter., MD Debridement: Open Wound/Selective Debridement Selective Description: Pre-procedure Yes Verification/Time Out Taken: Start Time: 15:31 Pain Control: Lidocaine 4% Topical Solution Level: Non-Viable Tissue Total Area Debrided (L x 1.1 (cm) x 3.5 (cm) = 3.85 (cm) W): Tissue and other Non-Viable, Eschar, Exudate, Fibrin/Slough, Subcutaneous material debrided: Instrument: Curette Bleeding: Minimum Hemostasis Achieved: Pressure End Time: 15:33 Procedural Pain: 0 Post Procedural Pain: 0 Response to Treatment: Procedure was tolerated well Post Debridement Measurements of Total Wound Length: (cm) 1.1 Width: (cm) 3.5 Depth: (cm) 0.2 Volume: (cm)  0.605 Notes Biopsy culture obtained today. Electronic Signature(s) Signed: 01/29/2015 1:50:02 PM By: Loletha Grayer MD Entered By: Loletha Grayer on 01/29/2015 15:40:48 [name], [name] ([id number]) -------------------------------------------------------------------------------- HPI Details Patient Name: [name]. Date of Service: 01/29/2015 2:30 PM Medical Record Number: [id number] Patient Account Number: [id number] Date of Birth/Sex: 10-18-1924 (81 y.o. Male) Treating RN: Primary Care Physician: Lelon Huh Other Clinician: Referring Physician: Treating Physician/Extender: BURNS, Charlean Sanfilippo in Treatment: 0 History of Present Illness HPI Description: Pleasant 81 year old with history of diabetes (hemoglobin A1c 8.6 in April 2016), peripheral neuropathy, peripheral vascular disease (status post bilateral lower extremity stents per patient), atrial fibrillation, and chronic kidney disease. He developed a blister on his right medial calf in April 2016 and a subsequent ulceration. No significant improvement with Silvadene. Currently applying Neosporin. He reports mild pain with pressure. No claudication or rest pain. Ambulating normally per his baseline. Unable to obtain an ABI. Has seen Dr. Lucky Cowboy in the past and is scheduled to see him again in June. No antibiotics. Denies fever or chills. No significant drainage. Electronic Signature(s) Signed: 01/29/2015 1:50:02 PM By: Loletha Grayer MD Entered By: Loletha Grayer on 01/29/2015 15:43:18 [name], [name] ([id number]) -------------------------------------------------------------------------------- Physical Exam Details Patient Name: [name]. Date of Service: 01/29/2015 2:30 PM Medical Record Number: [id number] Patient Account Number: [id number] Date of Birth/Sex: 1925-02-24 (81 y.o. Male) Treating RN: Primary Care Physician: Lelon Huh Other Clinician: Referring Physician: Treating  Physician/Extender: BURNS, Charlean Sanfilippo in Treatment: 0 Constitutional . Pulse regular. Respirations normal and unlabored. Afebrile. Marland Kitchen Respiratory WNL. No retractions.. Cardiovascular . Integumentary (Hair, Skin) .Marland Kitchen Neurological . Psychiatric Judgement and insight Intact.. Oriented times 3.. No evidence of depression, anxiety, or agitation.. Notes Right medial calf ulceration. Full-thickness. Eschar and underlying biofilm debrided. Biopsy culture obtained. Mild surrounding erythema and cellulitis. Tender. 1+ pitting edema, localized. No palpable pedal pulses. No dopplerable DP. Faintly dopplerable, monophasic PT. No toe signal. Foot cool to touch. Capillary refill greater than 1 second. Electronic Signature(s) Signed: 01/29/2015 1:50:02 PM By: Loletha Grayer MD Entered By: Quay Burow  IIIThayer Jew on 01/29/2015 15:44:54 [name], [name] ([id number]) -------------------------------------------------------------------------------- Physician Orders Details Patient Name: Bridges, [name]. Date of Service: 01/29/2015 2:30 PM Medical Record Number: [id number] Patient Account Number: [id number] Date of Birth/Sex: 1925-02-11 (81 y.o. Male) Treating RN: Baruch Gouty, RN, BSN, Velva Harman Primary Care Physician: Lelon Huh Other Clinician: Referring Physician: Treating Physician/Extender: BURNS, Charlean Sanfilippo in Treatment: 0 Verbal / Phone Orders: Yes Clinician: Afful, RN, BSN, Rita Read Back and Verified: Yes Diagnosis Coding Wound Cleansing Wound #1 Right,Medial Lower Leg o Cleanse wound with mild soap and water o May Shower, gently pat wound dry prior to applying new dressing. o May shower with protection. Primary Wound Dressing Wound #1 Right,Medial Lower Leg o Prisma Ag Secondary Dressing Wound #1 Right,Medial Lower Leg o Boardered Foam Dressing Dressing Change Frequency Wound #1 Right,Medial Lower Leg o Change dressing every other day. Follow-up Appointments Wound #1  Right,Medial Lower Leg o Return Appointment in 1 week. Edema Control Wound #1 Right,Medial Lower Leg o Tubigrip Additional Orders / Instructions Wound #1 Right,Medial Lower Leg o Increase protein intake. o Activity as tolerated Medications-please add to medication list. Wound #1 Right,Medial Lower Leg o P.O. Antibiotics - Doxycyline 100 PO BID x 1 week [name], [name] ([id number]) Laboratory o Culture and Sensitivity - right lower leg oooo Patient Medications Allergies: Crestor- Hyperlipidemics, Penicillins Notifications Medication Indication Start End doxycycline monohydrate 01/29/2015 DOSE oral 100 mg capsule - capsule oral Electronic Signature(s) Signed: 01/29/2015 1:50:02 PM By: Loletha Grayer MD Signed: 01/29/2015 5:13:49 PM By: Regan Lemming BSN, RN Entered By: Regan Lemming on 01/29/2015 15:36:29 [name], [name] ([id number]) -------------------------------------------------------------------------------- Prescription 01/29/2015 Patient Name: [name] Physician: Kandy Garrison MD Date of Birth: Dec 17, 1924 NPI#: [id number] Sex: M DEA#: Phone #: [phone] License #: [facility] [address], [place], [place] [address] [id number] Allergies Crestor- Hyperlipidemics Penicillins Medication Medication: Route: Strength: Form: doxycycline monohydrate oral 100 mg capsule Class: TETRACYCLINES Dose: Frequency / Time: Indication: capsule oral Number of Refills: Number of Units: 1 Generic Substitution: Start Date: End Date: Administered at Substitution Permitted 7/68/1157 Facility: No Note to Pharmacy: BERLIN, MOKRY ([id number]) Electronic Signature(s) Signed: 01/29/2015 1:50:02 PM By: Loletha Grayer MD Signed: 01/29/2015 5:13:49 PM By: Regan Lemming BSN, RN Entered By: Regan Lemming on 01/29/2015 15:36:29 Wingert, [name]  ([id number]) --------------------------------------------------------------------------------  Problem List Details Patient Name: [name]. Date of Service: 01/29/2015 2:30 PM Medical Record Number: [id number] Patient Account Number: [id number] Date of Birth/Sex: [birth year] (81 y.o. 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 Date of Service: 01/29/2015 2:30 PM Medical Record Number: [id number] Patient Account Number: [id number] Date of Birth/Sex: 1925/02/13 (81 y.o. Male) Treating RN: Primary Care Physician: Lelon Huh Other Clinician: Referring Physician: Treating Physician/Extender: BURNS, Thayer Jew  Weeks in Treatment: 0 Subjective Chief Complaint Information obtained from Patient Right calf ulcer. Arterial insufficiency. History of Present Illness (HPI) Pleasant 81 year old with history of diabetes (hemoglobin A1c 8.6 in April 2016), peripheral neuropathy, peripheral vascular disease (status post  bilateral lower extremity stents per patient), atrial fibrillation, and chronic kidney disease. He developed a blister on his right medial calf in April 2016 and a subsequent ulceration. No significant improvement with Silvadene. Currently applying Neosporin. He reports mild pain with pressure. No claudication or rest pain. Ambulating normally per his baseline. Unable to obtain an ABI. Has seen Dr. Lucky Cowboy in the past and is scheduled to see him again in June. No antibiotics. Denies fever or chills. No significant drainage. Wound History Patient presents with 1 open wound that has been present for approximately 1 month. Patient has been treating wound in the following manner: silvadene; neosporin. Laboratory tests have been performed in the last month. Patient reportedly has not tested positive for an antibiotic resistant organism. Patient reportedly has not tested positive for osteomyelitis. Patient reportedly has had testing performed to evaluate circulation in the legs. Patient experiences the following problems associated with their wounds: swelling. Patient History Allergies Crestor- Hyperlipidemics, Penicillins Family History Cancer - Siblings, Diabetes - Mother, Kidney Disease - Father, No family history of Heart Disease, Hereditary Spherocytosis, Hypertension, Lung Disease, Seizures, Stroke, Thyroid Problems. Social History Never smoker, Marital Status - Married, Alcohol Use - Never, Drug Use - No History, Caffeine Use - Daily. Medical History Eyes Patient has history of Cataracts - removed 09/2013 (B) [name], [name]. ([id number]) Denies history of Glaucoma, Optic Neuritis Ear/Nose/Mouth/Throat Denies history of Chronic sinus problems/congestion, Middle ear problems Hematologic/Lymphatic Denies history of Anemia, Hemophilia, Human Immunodeficiency Virus, Lymphedema, Sickle Cell Disease Respiratory Patient has history of Sleep Apnea - pt reports his test was normal Denies  history of Asthma, Chronic Obstructive Pulmonary Disease (COPD), Pneumothorax, Tuberculosis Cardiovascular Patient has history of Arrhythmia - Afib, Congestive Heart Failure, Hypertension, Peripheral Arterial Disease Denies history of Angina, Coronary Artery Disease, Deep Vein Thrombosis, Hypotension, Myocardial Infarction, Peripheral Venous Disease, Phlebitis, Vasculitis Gastrointestinal Denies history of Cirrhosis , Colitis, Crohn s, Hepatitis A, Hepatitis B, Hepatitis C Endocrine Patient has history of Type II Diabetes - 01/10/15 A1C 8.6 Denies history of Type I Diabetes Genitourinary Denies history of End Stage Renal Disease Immunological Denies history of Lupus Erythematosus, Raynaud s, Scleroderma Integumentary (Skin) Denies history of History of Burn, History of pressure wounds Musculoskeletal Patient has history of Gout, Osteoarthritis Denies history of Rheumatoid Arthritis, Osteomyelitis Neurologic Patient has history of Neuropathy Denies history of Dementia, Quadriplegia, Paraplegia, Seizure Disorder Oncologic Denies history of Received Chemotherapy, Received Radiation Psychiatric Denies history of Anorexia/bulimia, Confinement Anxiety Patient is treated with Insulin. Blood sugar is tested. Blood sugar results noted at the following times: Breakfast - 120-140. Hospitalization/Surgery History - 09/13/1960, [facility], Appendectomy. - 09/14/2007, [place], Angioplasty. - 09/13/2008, [place], Angioplasty . - 12/13/2014, Skin CA excision . Medical And Surgical History Notes Constitutional Symptoms (General Health) HLD Hematologic/Lymphatic polycythemia; pernicious anemia Cardiovascular (B) carotid artery disease; angioplasty 2009/2010; carotid endarterectomy 08/15/2014; cardiac stent placed 08/14/2015 Gastrointestinal IBS; GERD Endocrine [name], GAMEL. ([id number]) pancreatitis; appendectomy Genitourinary CKD stg III; testicular mass Musculoskeletal Lumbago Oncologic Skin CA- (B)  ears/neck Review of Systems (ROS) Constitutional Symptoms (General Health) The patient has no complaints or symptoms. Eyes Complains or has symptoms of Glasses / Contacts. Ear/Nose/Mouth/Throat The patient has no complaints or symptoms. Respiratory The patient has no complaints or symptoms. Cardiovascular Complains or has symptoms  of LE edema. Gastrointestinal The patient has no complaints or symptoms. Endocrine Complains or has symptoms of Thyroid disease - hypothydroidism. Denies complaints or symptoms of Hepatitis, Polydypsia (Excessive Thirst). Genitourinary The patient has no complaints or symptoms. Immunological The patient has no complaints or symptoms. Integumentary (Skin) Complains or has symptoms of Wounds, Bleeding or bruising tendency, Swelling - BLE. Denies complaints or symptoms of Breakdown. Musculoskeletal The patient has no complaints or symptoms. Neurologic Complains or has symptoms of Numbness/parasthesias - (B) feet. Oncologic The patient has no complaints or symptoms. Psychiatric The patient has no complaints or symptoms. Objective Constitutional Pulse regular. Respirations normal and unlabored. Afebrile. [name], [name] ([id number]) Vitals Time Taken: 2:48 PM, Height: 72 in, Source: Stated, Weight: 204.1 lbs, Source: Measured, BMI: 27.7, Temperature: 98.3 F, Pulse: 91 bpm, Respiratory Rate: 24 breaths/min, Blood Pressure: 137/69 mmHg. Respiratory WNL. No retractions.Marland Kitchen Psychiatric Judgement and insight Intact.. Oriented times 3.. No evidence of depression, anxiety, or agitation.. General Notes: Right medial calf ulceration. Full-thickness. Eschar and underlying biofilm debrided. Biopsy culture obtained. Mild surrounding erythema and cellulitis. Tender. 1+ pitting edema, localized. No palpable pedal pulses. No dopplerable DP. Faintly dopplerable, monophasic PT. No toe signal. Foot cool to touch. Capillary refill greater than 1  second. Integumentary (Hair, Skin) Wound #1 status is Open. Original cause of wound was Blister. The wound is located on the Right,Medial Lower Leg. The wound measures 1.1cm length x 3.5cm width x 0.1cm depth; 3.024cm^2 area and 0.302cm^3 volume. The wound is limited to skin breakdown. There is no tunneling or undermining noted. There is a medium amount of serosanguineous drainage noted. The wound margin is distinct with the outline attached to the wound base. There is medium (34-66%) red granulation within the wound bed. There is a medium (34-66%) amount of necrotic tissue within the wound bed including Adherent Slough. The periwound skin appearance exhibited: Moist, Hemosiderin Staining, Erythema. The periwound skin appearance did not exhibit: Callus, Crepitus, Excoriation, Fluctuance, Friable, Induration, Localized Edema, Rash, Scarring, Dry/Scaly, Maceration, Atrophie Blanche, Cyanosis, Ecchymosis, Mottled, Pallor, Rubor. The surrounding wound skin color is noted with erythema. Periwound temperature was noted as No Abnormality. The periwound has tenderness on palpation. 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 ([id number]) Right calf ulcer and severe peripheral vascular disease. Procedures Wound #1 Wound #1 is a Diabetic Wound/Ulcer of the Lower Extremity located on the Right,Medial Lower Leg . There was a Non-Viable Tissue Open Wound/Selective [phone]) debridement with total area of 3.85 sq cm performed by BURNS, Teressa Senter., MD. with the following instrument(s): Curette to remove  Non-Viable tissue/material including Exudate, Fibrin/Slough, Eschar, and Subcutaneous after achieving pain control using Lidocaine 4% Topical Solution. A time out was conducted prior to the start of the procedure. A Minimum amount of bleeding was controlled with Pressure. The procedure was tolerated well with a pain level of 0 throughout and a pain level of 0 following the procedure. Post Debridement Measurements: 1.1cm length x 3.5cm width x 0.2cm depth; 0.605cm^3 volume. General Notes: Biopsy culture obtained today.. Plan Wound Cleansing: Wound #1 Right,Medial Lower Leg: Cleanse wound with mild soap and water May Shower, gently pat wound dry prior to applying new dressing.  May shower with protection. Primary Wound Dressing: Wound #1 Right,Medial Lower Leg: Prisma Ag Secondary Dressing: Wound #1 Right,Medial Lower Leg: Boardered Foam Dressing Dressing Change Frequency: Wound #1 Right,Medial Lower Leg: Change dressing every other day. Follow-up Appointments: Wound #1 Right,Medial Lower Leg: Return Appointment in 1 week. Edema Control: Wound #1 Right,Medial Lower Leg: Tubigrip Additional Orders / Instructions: Wound #1 Right,Medial Lower Leg: Increase protein intake. Activity as tolerated Medications-please add to medication list.: Wound #1 Right,Medial Lower Leg: [name], [place]. ([id number]) P.O. Antibiotics - Doxycyline 100 PO BID x 1 week Laboratory ordered were: Culture and Sensitivity - right lower leg The following medication(s) was prescribed: doxycycline monohydrate oral 100 mg capsule capsule oral starting 01/29/2015 Promogran Prisma. Tubigrip for edema control. Will try to schedule earlier follow-up appointment with Dr. Lucky Cowboy. follow-up on biopsy culture obtained today. Doxycycline prescribed for 1 week. Electronic Signature(s) Signed: 01/29/2015 1:50:02 PM By: Loletha Grayer MD Entered By: Loletha Grayer on 01/29/2015 15:51:29 [name], [name]  ([id number]) -------------------------------------------------------------------------------- ROS/PFSH Details Patient Name: [name]. Date of Service: 01/29/2015 2:30 PM Medical Record Number: [id number] Patient Account Number: [id number] Date of Birth/Sex: [birth year] (81 y.o. Male) Treating RN: Junious Dresser Primary Care Physician: Lelon Huh Other Clinician: Referring Physician: Treating Physician/Extender: BURNS, Charlean Sanfilippo in Treatment: 0 Label Progress Note Print Version as History and Physical for this encounter Wound History Do you currently have one or more open woundso Yes How many open wounds do you currently haveo 1 Approximately how long have you had your woundso 1 month How have you been treating your wound(s) until nowo silvadene; neosporin Has your wound(s) ever healed and then re-openedo No Have you had any lab work done in the past montho Yes Have you tested positive for an antibiotic resistant organism (MRSA, VRE)o No Have you tested positive for osteomyelitis (bone infection)o No Have you had any tests for circulation on your legso Yes Where was the test doneo [facility]- Dr. Lucky Cowboy Have you had other problems associated with your woundso Swelling Eyes Complaints and Symptoms: Positive for: Glasses / Contacts Medical History: Positive for: Cataracts - removed 09/2013 (B) Negative for: Glaucoma; Optic Neuritis Cardiovascular Complaints and Symptoms: Positive for: LE edema Medical History: Positive for: Arrhythmia - Afib; Congestive Heart Failure; Hypertension; Peripheral Arterial Disease Negative for: Angina; Coronary Artery Disease; Deep Vein Thrombosis; Hypotension; Myocardial Infarction; Peripheral Venous Disease; Phlebitis; Vasculitis Past Medical History Notes: (B) carotid artery disease; angioplasty 2009/2010; carotid endarterectomy 08/15/2014; cardiac stent placed 08/14/2015 Endocrine Complaints and Symptoms: Positive for: Thyroid disease -  hypothydroidism Negative for: Hepatitis; Polydypsia (Excessive Thirst) [name], [place]. ([id number]) Medical History: Positive for: Type II Diabetes - 01/10/15 A1C 8.6 Negative for: Type I Diabetes Past Medical History Notes: pancreatitis; appendectomy Time with diabetes: 2 years Treated with: Insulin Blood sugar tested every day: Yes Tested : daily Blood sugar testing results: Breakfast: 120-140 Integumentary (Skin) Complaints and Symptoms: Positive for: Wounds; Bleeding or bruising tendency; Swelling - BLE Negative for: Breakdown Medical History: Negative for: History of Burn; History of pressure wounds Neurologic Complaints and Symptoms: Positive for: Numbness/parasthesias - (B) feet Medical History: Positive for: Neuropathy Negative for: Dementia; Quadriplegia; Paraplegia; Seizure Disorder Constitutional Symptoms (General Health) Complaints and Symptoms: No Complaints or Symptoms Medical History: Past Medical History Notes: HLD Ear/Nose/Mouth/Throat Complaints and Symptoms: No Complaints or Symptoms Medical History: Negative for: Chronic sinus problems/congestion; Middle ear problems Hematologic/Lymphatic Medical History: Negative for: Anemia; Hemophilia; Human Immunodeficiency Virus; Lymphedema; Sickle Cell Disease Past Medical History Notes: Townsend, [place] (  [id number]) polycythemia; pernicious anemia Respiratory Complaints and Symptoms: No Complaints or Symptoms Medical History: Positive for: Sleep Apnea - pt reports his test was normal Negative for: Asthma; Chronic Obstructive Pulmonary Disease (COPD); Pneumothorax; Tuberculosis Gastrointestinal Complaints and Symptoms: No Complaints or Symptoms Medical History: Negative for: Cirrhosis ; Colitis; Crohnos; Hepatitis A; Hepatitis B; Hepatitis C Past Medical History Notes: IBS; GERD Genitourinary Complaints and Symptoms: No Complaints or Symptoms Medical History: Negative for: End Stage Renal  Disease Past Medical History Notes: CKD stg III; testicular mass Immunological Complaints and Symptoms: No Complaints or Symptoms Medical History: Negative for: Lupus Erythematosus; Raynaudos; Scleroderma Musculoskeletal Complaints and Symptoms: No Complaints or Symptoms Medical History: Positive for: Gout; Osteoarthritis Negative for: Rheumatoid Arthritis; Osteomyelitis Past Medical History Notes: Lumbago Oncologic LYAM, PROVENCIO ([id number]) Complaints and Symptoms: No Complaints or Symptoms Medical History: Negative for: Received Chemotherapy; Received Radiation Past Medical History Notes: Skin CA- (B) ears/neck Psychiatric Complaints and Symptoms: No Complaints or Symptoms Medical History: Negative for: Anorexia/bulimia; Confinement Anxiety HBO Extended History Items Eyes: Cataracts Hospitalization / Surgery History Name of Hospital Purpose of Hospitalization/Surgery Date [facility] Appendectomy 09/13/1960 [place] Angioplasty 09/14/2007 [place] Angioplasty 09/13/2008 Skin CA excision 12/13/2014 Family and Social History Cancer: Yes - Siblings; Diabetes: Yes - Mother; Heart Disease: No; Hereditary Spherocytosis: No; Hypertension: No; Kidney Disease: Yes - Father; Lung Disease: No; Seizures: No; Stroke: No; Thyroid Problems: No; Never smoker; Marital Status - Married; Alcohol Use: Never; Drug Use: No History; Caffeine Use: Daily; Advanced Directives: Yes (Not Provided); Patient does not want information on Advanced Directives; Do not resuscitate: No; Living Will: Yes (Not Provided) Physician Affirmation I have reviewed and agree with the above information. Electronic Signature(s) Signed: 01/29/2015 1:50:02 PM By: Loletha Grayer MD Signed: 01/29/2015 4:50:44 PM By: Junious Dresser RN Previous Signature: 01/29/2015 3:18:58 PM Version By: Loletha Grayer MD Entered By: Loletha Grayer on 01/29/2015 15:49:16 Nardelli, [name]  ([id number]) -------------------------------------------------------------------------------- SuperBill Details Patient Name: [name]. Date of Service: 01/29/2015 Medical Record Number: [id number] Patient Account Number: [id number] Date of Birth/Sex: [birth year] (81 y.o. 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 E11.622 Type 2 diabetes mellitus with other skin ulcer E11.43 Type 2 diabetes mellitus with diabetic autonomic (poly)neuropathy E11.22 Type 2 diabetes mellitus with diabetic chronic kidney disease I48.2 Chronic atrial fibrillation L03.115 Cellulitis of right lower limb Facility Procedures CPT4: Description Modifier Quantity Code [id number] - DEBRIDE WOUND 1ST 20 SQ CM OR < 1 ICD-10 Description Diagnosis 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 E11.622 Type 2 diabetes mellitus  with other skin ulcer Physician Procedures CPT4: Description Modifier Quantity Code [id number] [id number] - WC PHYS LEVEL 4 - NEW PT 1 ICD-10 Description Diagnosis 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

## 2015-01-30 NOTE — Progress Notes (Signed)
HEARL, HEIKES ([id number]) Visit Report for 01/29/2015 Abuse/Suicide Risk Screen Details Patient Name: [name], [name]. Date of Service: 01/29/2015 2:30 PM Medical Record Number: [id number] Patient Account Number: [id number] Date of Birth/Sex: 1924-11-02 (81 y.o. Male) Treating RN: Junious Dresser Primary Care Physician: Lelon Huh Other Clinician: Referring Physician: Treating Physician/Extender: BURNS, Charlean Sanfilippo in Treatment: 0 Abuse/Suicide Risk Screen Items Answer ABUSE/SUICIDE RISK SCREEN: Has anyone close to you tried to hurt or harm you recentlyo No Do you feel uncomfortable with anyone in your familyo No Has anyone forced you do things that you didnot want to doo No Do you have any thoughts of harming yourselfo No Patient displays signs or symptoms of abuse and/or neglect. No Electronic Signature(s) Signed: 01/29/2015 4:50:44 PM By: Junious Dresser RN Entered By: Junious Dresser on 01/29/2015 15:17:23 Chamberlain, [name] ([id number]) -------------------------------------------------------------------------------- Activities of Daily Living Details Patient Name: [name], [name] Date of Service: 01/29/2015 2:30 PM Medical Record Number: [id number] Patient Account Number: [id number] Date of Birth/Sex: 06-12-1925 (81 y.o. Male) Treating RN: Junious Dresser Primary Care Physician: Lelon Huh Other Clinician: Referring Physician: Treating Physician/Extender: BURNS, Charlean Sanfilippo in Treatment: 0 Activities of Daily Living Items Answer Activities of Daily Living (Please select one for each item) Drive Automobile Completely Able Take Medications Completely Able Use Telephone Completely Able Care for Appearance Completely Able Use Toilet Completely Able Bath / Shower Completely Able Dress Self Completely Able Feed Self Completely Able Walk Completely Able Get In / Out Bed Completely Able Housework Completely Able Prepare Meals Completely Able Handle Money Completely  Able Shop for Self Completely Able Electronic Signature(s) Signed: 01/29/2015 4:50:44 PM By: Junious Dresser RN Entered By: Junious Dresser on 01/29/2015 15:17:33 Naron, [name] ([id number]) -------------------------------------------------------------------------------- Education Assessment Details Patient Name: [name]. Date of Service: 01/29/2015 2:30 PM Medical Record Number: [id number] Patient Account Number: [id number] Date of Birth/Sex: [birth year] (81 y.o. Male) Treating RN: Junious Dresser Primary Care Physician: Lelon Huh Other Clinician: Referring Physician: Treating Physician/Extender: BURNS, Charlean Sanfilippo in Treatment: 0 Primary Learner Assessed: Patient Learning Preferences/Education Level/Primary Language Learning Preference: Explanation, Demonstration, Printed Material Highest Education Level: High School Preferred Language: English Cognitive Barrier Assessment/Beliefs Language Barrier: No Translator Needed: No Memory Deficit: No Emotional Barrier: No Cultural/Religious Beliefs Affecting Medical No Care: Physical Barrier Assessment Impaired Vision: Yes Glasses Impaired Hearing: No Decreased Hand dexterity: No Knowledge/Comprehension Assessment Knowledge Level: Medium Comprehension Level: Medium Ability to understand written Medium instructions: Ability to understand verbal Medium instructions: Motivation Assessment Anxiety Level: Calm Cooperation: Cooperative Education Importance: Acknowledges Need Interest in Health Problems: Asks Questions Perception: Coherent Willingness to Engage in Self- Medium Management Activities: Readiness to Engage in Self- Medium Management Activities: Electronic Signature(s) DETRIC, SCALISI ([id number]) Signed: 01/29/2015 4:50:44 PM By: Junious Dresser RN Entered By: Junious Dresser on 01/29/2015 15:17:57 Ruffner, [name]  ([id number]) -------------------------------------------------------------------------------- Fall Risk Assessment Details Patient Name: [name]. Date of Service: 01/29/2015 2:30 PM Medical Record Number: [id number] Patient Account Number: [id number] Date of Birth/Sex: 10/13/1924 (81 y.o. Male) Treating RN: Junious Dresser Primary Care Physician: Lelon Huh Other Clinician: Referring Physician: Treating Physician/Extender: BURNS, Charlean Sanfilippo in Treatment: 0 Fall Risk Assessment Items FALL RISK ASSESSMENT: History of falling - immediate or within 3 months 0 No Secondary diagnosis 0 No Ambulatory aid None/bed rest/wheelchair/nurse 0 Yes Crutches/cane/walker 0 No Furniture 0 No IV Access/Saline Lock 0 No Gait/Training Normal/bed rest/immobile 0 Yes Weak 0 No Impaired 0 No Mental Status Oriented to own ability 0 Yes Electronic Signature(s) Signed: 01/29/2015 4:50:44 PM By:  Junious Dresser RN Entered By: Junious Dresser on 01/29/2015 15:18:08 Garson, [name] ([id number]) -------------------------------------------------------------------------------- Foot Assessment Details Patient Name: [name]. Date of Service: 01/29/2015 2:30 PM Medical Record Number: [id number] Patient Account Number: [id number] Date of Birth/Sex: 04/28/1925 (81 y.o. Male) Treating RN: Junious Dresser Primary Care Physician: Lelon Huh Other Clinician: Referring Physician: Treating Physician/Extender: BURNS, Charlean Sanfilippo in Treatment: 0 Foot Assessment Items Site Locations + = Sensation present, - = Sensation absent, C = Callus, U = Ulcer R = Redness, W = Warmth, M = Maceration, PU = Pre-ulcerative lesion F = Fissure, S = Swelling, D = Dryness Assessment Right: Left: Other Deformity: No No Prior Foot Ulcer: No No Prior Amputation: No No Charcot Joint: No No Ambulatory Status: Ambulatory Without Help Gait: Steady Electronic Signature(s) Signed: 01/29/2015 4:50:44 PM By: Junious Dresser RN Entered By: Junious Dresser on 01/29/2015 15:20:07 Antuna, [name] ([id number]) -------------------------------------------------------------------------------- Nutrition Risk Assessment Details Patient Name: [name]. Date of Service: 01/29/2015 2:30 PM Medical Record Number: [id number] Patient Account Number: [id number] Date of Birth/Sex: 28-Sep-1924 (81 y.o. Male) Treating RN: Junious Dresser Primary Care Physician: Lelon Huh Other Clinician: Referring Physician: Treating Physician/Extender: BURNS, Charlean Sanfilippo in Treatment: 0 Height (in): 72 Weight (lbs): 204.1 Body Mass Index (BMI): 27.7 Nutrition Risk Assessment Items NUTRITION RISK SCREEN: I have an illness or condition that made me change the kind and/or 0 No amount of food I eat I eat fewer than two meals per day 0 No I eat few fruits and vegetables, or milk products 0 No I have three or more drinks of beer, liquor or wine almost every day 0 No I have tooth or mouth problems that make it hard for me to eat 0 No I don't always have enough money to buy the food I need 0 No I eat alone most of the time 0 No I take three or more different prescribed or over-the-counter drugs a 1 Yes day Without wanting to, I have lost or gained 10 pounds in the last six 0 No months I am not always physically able to shop, cook and/or feed myself 0 No Nutrition Protocols Good Risk Protocol 0 No interventions needed Moderate Risk Protocol Electronic Signature(s) Signed: 01/29/2015 4:50:44 PM By: Junious Dresser RN Entered By: Junious Dresser on 01/29/2015 15:18:19

## 2015-01-30 NOTE — Progress Notes (Signed)
[name], Bridges ([id number]) Visit Report for 01/29/2015 Allergy List Details Patient Name: [name], [name]. Date of Service: 01/29/2015 2:30 PM Medical Record Number: [id number] Patient Account Number: [id number] Date of Birth/Sex: 30-Mar-1925 (81 y.o. Male) Treating RN: Junious Dresser Primary Care Physician: Lelon Huh Other Clinician: Referring Physician: Treating Physician/Extender: BURNS, Charlean Sanfilippo in Treatment: 0 Allergies Active Allergies Crestor- Hyperlipidemics Penicillins Allergy Notes Electronic Signature(s) Signed: 01/29/2015 4:50:44 PM By: Junious Dresser RN Entered By: Junious Dresser on 01/29/2015 14:31:28 [name], [name] ([id number]) -------------------------------------------------------------------------------- Arrival Information Details Patient Name: [name]. Date of Service: 01/29/2015 2:30 PM Medical Record Number: [id number] Patient Account Number: [id number] Date of Birth/Sex: Nov 01, 1924 (81 y.o. Male) Treating RN: Junious Dresser Primary Care Physician: Lelon Huh Other Clinician: Referring Physician: Treating Physician/Extender: BURNS, Charlean Sanfilippo in Treatment: 0 Visit Information Patient Arrived: Ambulatory Arrival Time: 14:47 Accompanied By: self Transfer Assistance: None Patient Identification Verified: Yes Secondary Verification Process Yes Completed: Patient Has Alerts: Yes Patient Alerts: Patient on Blood Thinner DMII Plavix and ASA 5/16 ABI (L/R)- inaudible Electronic Signature(s) Signed: 01/29/2015 4:50:44 PM By: Junious Dresser RN Entered By: Junious Dresser on 01/29/2015 15:07:24 [name] ([id number]) -------------------------------------------------------------------------------- Clinic Level of Care Assessment Details Patient Name: [name]. Date of Service: 01/29/2015 2:30 PM Medical Record Number: [id number] Patient Account Number: [id number] Date of Birth/Sex: 1925/02/12 (81 y.o. Male) Treating RN:  Baruch Gouty, RN, BSN, Velva Harman Primary Care Physician: Lelon Huh Other Clinician: Referring Physician: Treating Physician/Extender: BURNS, Charlean Sanfilippo in Treatment: 0 Clinic Level of Care Assessment Items TOOL 1 Quantity Score []  - Use when EandM and Procedure is performed on INITIAL visit 0 ASSESSMENTS - Nursing Assessment / Reassessment X - General Physical Exam (combine w/ comprehensive assessment (listed just 1 20 below) when performed on new pt. evals) X - Comprehensive Assessment (HX, ROS, Risk Assessments, Wounds Hx, etc.) 1 25 ASSESSMENTS - Wound and Skin Assessment / Reassessment []  - Dermatologic / Skin Assessment (not related to wound area) 0 ASSESSMENTS - Ostomy and/or Continence Assessment and Care []  - Incontinence Assessment and Management 0 []  - Ostomy Care Assessment and Management (repouching, etc.) 0 PROCESS - Coordination of Care X - Simple Patient / Family Education for ongoing care 1 15 []  - Complex (extensive) Patient / Family Education for ongoing care 0 X - Staff obtains Programmer, systems, Records, Test Results / Process Orders 1 10 X - Staff telephones HHA, Nursing Homes / Clarify orders / etc 1 10 []  - Routine Transfer to another Facility (non-emergent condition) 0 []  - Routine Hospital Admission (non-emergent condition) 0 X - New Admissions / Biomedical engineer / Ordering NPWT, Apligraf, etc. 1 15 []  - Emergency Hospital Admission (emergent condition) 0 PROCESS - Special Needs []  - Pediatric / Minor Patient Management 0 []  - Isolation Patient Management 0 [name], [name] ([id number]) []  - Hearing / Language / Visual special needs 0 []  - Assessment of Community assistance (transportation, D/C planning, etc.) 0 []  - Additional assistance / Altered mentation 0 []  - Support Surface(s) Assessment (bed, cushion, seat, etc.) 0 INTERVENTIONS - Miscellaneous []  - External ear exam 0 []  - Patient Transfer (multiple staff / Civil Service fast streamer / Similar devices) 0 []  -  Simple Staple / Suture removal (25 or less) 0 []  - Complex Staple / Suture removal (26 or more) 0 []  - Hypo/Hyperglycemic Management (do not check if billed separately) 0 X - Ankle / Brachial Index (ABI) - do not check if billed separately 1 15 Has the patient been seen at the  hospital within the last three years: Yes Total Score: 110 Level Of Care: New/Established - Level 3 Electronic Signature(s) Signed: 01/29/2015 5:13:49 PM By: Regan Lemming BSN, RN Entered By: Regan Lemming on 01/29/2015 16:34:23 [name] ([id number]) -------------------------------------------------------------------------------- Encounter Discharge Information Details Patient Name: [name] Puff H. Date of Service: 01/29/2015 2:30 PM Medical Record Number: [id number] Patient Account Number: [id number] Date of Birth/Sex: 15-Jan-1925 (81 y.o. Male) Treating RN: Primary Care Physician: Lelon Huh Other Clinician: Referring Physician: Treating Physician/Extender: BURNS, Charlean Sanfilippo in Treatment: 0 Encounter Discharge Information Items Schedule Follow-up Appointment: No Medication Reconciliation completed No and provided to Patient/Care [name]: Provided on Clinical Summary of Care: 01/29/2015 Form Type Recipient Paper Patient HR Electronic Signature(s) Signed: 01/29/2015 3:46:11 PM By: Ruthine Dose Entered By: Ruthine Dose on 01/29/2015 15:46:11 [name] ([id number]) -------------------------------------------------------------------------------- Lower Extremity Assessment Details Patient Name: [name]. Date of Service: 01/29/2015 2:30 PM Medical Record Number: [id number] Patient Account Number: [id number] Date of Birth/Sex: 31-May-1925 (81 y.o. Male) Treating RN: Junious Dresser Primary Care Physician: Lelon Huh Other Clinician: Referring Physician: Treating Physician/Extender: BURNS, Charlean Sanfilippo in Treatment: 0 Edema Assessment Assessed: [Left: Yes] [Right: Yes] Edema:  [Left: Yes] [Right: Yes] Calf Left: Right: Point of Measurement: 35 cm From Medial Instep 36.1 cm 36 cm Ankle Left: Right: Point of Measurement: 12 cm From Medial Instep 22.9 cm 22.1 cm Vascular Assessment Claudication: Claudication Assessment [Left:None] [Right:None] Pulses: Posterior Tibial Palpable: [Left:No] [Right:No] Doppler: [Left:Monophasic] [Right:Monophasic] Dorsalis Pedis Palpable: [Left:No] [Right:No] Doppler: [Left:Inaudible] [Right:Inaudible] Extremity colors, hair growth, and conditions: Extremity Color: [Left:Hyperpigmented] [Right:Hyperpigmented] Hair Growth on Extremity: [Left:No] Temperature of Extremity: [Left:Cool] [Right:Cool] Capillary Refill: [Left:< 3 seconds] [Right:< 3 seconds] Dependent Rubor: [Left:No] [Right:No] Blanched when Elevated: [Left:No] [Right:No] Toe Nail Assessment Left: Right: Thick: Yes Yes Discolored: Yes Yes Deformed: No No Improper Length and Hygiene: No No Deyo, [name] ([id number]) Notes ABI- (L/R) DP- inaudible PT- too faint to get accurate measurement Electronic Signature(s) Signed: 01/29/2015 4:50:44 PM By: Junious Dresser RN Entered By: Junious Dresser on 01/29/2015 15:06:51 Altieri, [name] ([id number]) -------------------------------------------------------------------------------- Multi Wound Chart Details Patient Name: [name]. Date of Service: 01/29/2015 2:30 PM Medical Record Number: [id number] Patient Account Number: [id number] Date of Birth/Sex: 1925-03-06 (81 y.o. Male) Treating RN: Baruch Gouty, RN, BSN, Velva Harman Primary Care Physician: Lelon Huh Other Clinician: Referring Physician: Treating Physician/Extender: BURNS, Charlean Sanfilippo in Treatment: 0 Vital Signs Height(in): 72 Pulse(bpm): 91 Weight(lbs): 204.1 Blood Pressure 137/69 (mmHg): Body Mass Index(BMI): 28 Temperature(F): 98.3 Respiratory Rate 24 (breaths/min): Photos: [1:No Photos] [N/A:N/A] Wound Location: [1:Right Lower Leg - Medial  N/A] Wounding Event: [1:Blister] [N/A:N/A] Primary Etiology: [1:Diabetic Wound/Ulcer of N/A the Lower Extremity] Comorbid History: [1:Cataracts, Sleep Apnea, N/A Arrhythmia, Congestive Heart Failure, Hypertension, Peripheral Arterial Disease, Type II Diabetes, Gout, Osteoarthritis, Neuropathy] Date Acquired: [1:12/30/2014] [N/A:N/A] Weeks of Treatment: [1:0] [N/A:N/A] Wound Status: [1:Open] [N/A:N/A] Measurements L x W x D 1.1x3.5x0.1 [N/A:N/A] (cm) Area (cm) : [1:3.024] [N/A:N/A] Volume (cm) : [1:0.302] [N/A:N/A] % Reduction in Area: [1:0.00%] [N/A:N/A] % Reduction in Volume: 0.00% [N/A:N/A] Classification: [1:Grade 1] [N/A:N/A] Exudate Amount: [1:Medium] [N/A:N/A] Exudate Type: [1:Serosanguineous] [N/A:N/A] Exudate Color: [1:red, brown] [N/A:N/A] Wound Margin: [1:Distinct, outline attached N/A] Granulation Amount: [1:Medium (34-66%)] [N/A:N/A] Granulation Quality: [1:Red] [N/A:N/A] Necrotic Amount: [1:Medium (34-66%)] [N/A:N/A] Exposed Structures: [N/A:N/A] Fascia: No Fat: No Tendon: No Muscle: No Joint: No Bone: No Limited to Skin Breakdown Epithelialization: None N/A N/A Periwound Skin Texture: Edema: No N/A N/A Excoriation: No Induration: No Callus: No Crepitus: No Fluctuance: No Friable: No Rash: No Scarring:  No Periwound Skin Moist: Yes N/A N/A Moisture: Maceration: No Dry/Scaly: No Periwound Skin Color: Erythema: Yes N/A N/A Hemosiderin Staining: Yes Atrophie Blanche: No Cyanosis: No Ecchymosis: No Mottled: No Pallor: No Rubor: No Temperature: No Abnormality N/A N/A Tenderness on Yes N/A N/A Palpation: Wound Preparation: Ulcer Cleansing: N/A N/A Rinsed/Irrigated with Saline Topical Anesthetic Applied: Other: Lidocaine 4% Ointment Treatment Notes Electronic Signature(s) Signed: 01/29/2015 5:13:49 PM By: Regan Lemming BSN, RN Entered By: Regan Lemming on 01/29/2015 15:28:15 [name], [name]  ([id number]) -------------------------------------------------------------------------------- [place] Details Patient Name: [name]. Date of Service: 01/29/2015 2:30 PM Medical Record Number: [id number] Patient Account Number: [id number] Date of Birth/Sex: [birth year] (81 y.o. Male) Treating RN: Baruch Gouty, RN, BSN, Velva Harman Primary Care Physician: Lelon Huh Other Clinician: Referring Physician: Treating Physician/Extender: BURNS, Charlean Sanfilippo in Treatment: 0 Active Inactive Abuse / Safety / Falls / Self Care Management Nursing Diagnoses: Impaired home maintenance Impaired physical mobility Knowledge deficit related to: safety; personal, health (wound), emergency Potential for falls Self care deficit: actual or potential Goals: Patient will remain injury free Date Initiated: 01/29/2015 Goal Status: Active Patient/caregiver will verbalize understanding of skin care regimen Date Initiated: 01/29/2015 Goal Status: Active Patient/caregiver will verbalize/demonstrate measure taken to improve self care Date Initiated: 01/29/2015 Goal Status: Active Patient/caregiver will verbalize/demonstrate measures taken to improve the patient's personal safety Date Initiated: 01/29/2015 Goal Status: Active Patient/caregiver will verbalize/demonstrate measures taken to prevent injury and/or falls Date Initiated: 01/29/2015 Goal Status: Active Patient/caregiver will verbalize/demonstrate understanding of what to do in case of emergency Date Initiated: 01/29/2015 Goal Status: Active Interventions: Assess: immobility, friction, shearing, incontinence upon admission and as needed Assess impairment of mobility on admission and as needed per policy Assess self care needs on admission and as needed Patient referred to community resources (specify in notes) Provide education on basic hygiene MATTSON, DAYAL ([id number]) Provide education on fall prevention Provide education on  personal and home safety Provide education on safe transfers Notes: Orientation to the Wound Care Program Nursing Diagnoses: Knowledge deficit related to the wound healing center program Goals: Patient/caregiver will verbalize understanding of the [place] Program Date Initiated: 01/29/2015 Goal Status: Active Interventions: Provide education on orientation to the wound center Notes: Venous Leg Ulcer Nursing Diagnoses: Knowledge deficit related to disease process and management Potential for venous Insuffiency (use before diagnosis confirmed) Goals: Patient will maintain optimal edema control Date Initiated: 01/29/2015 Goal Status: Active Patient/caregiver will verbalize understanding of disease process and disease management Date Initiated: 01/29/2015 Goal Status: Active Verify adequate tissue perfusion prior to therapeutic compression application Date Initiated: 01/29/2015 Goal Status: Active Interventions: Assess peripheral edema status every visit. Compression as ordered Provide education on venous insufficiency Notes: Wound/Skin Impairment [name], [name] ([id number]) Nursing Diagnoses: Impaired tissue integrity Knowledge deficit related to smoking impact on wound healing Knowledge deficit related to ulceration/compromised skin integrity Goals: Patient/caregiver will verbalize understanding of skin care regimen Date Initiated: 01/29/2015 Goal Status: Active Ulcer/skin breakdown will have a volume reduction of 30% by week 4 Date Initiated: 01/29/2015 Goal Status: Active Ulcer/skin breakdown will have a volume reduction of 50% by week 8 Date Initiated: 01/29/2015 Goal Status: Active Ulcer/skin breakdown will have a volume reduction of 80% by week 12 Date Initiated: 01/29/2015 Goal Status: Active Ulcer/skin breakdown will heal within 14 weeks Date Initiated: 01/29/2015 Goal Status: Active Interventions: Assess patient/caregiver ability to obtain necessary  supplies Assess patient/caregiver ability to perform ulcer/skin care regimen upon admission and as needed Assess ulceration(s) every visit Provide education on  ulcer and skin care Notes: Electronic Signature(s) Signed: 01/29/2015 5:13:49 PM By: Regan Lemming BSN, RN Entered By: Regan Lemming on 01/29/2015 17:09:51 [name], [name] ([id number]) -------------------------------------------------------------------------------- Pain Assessment Details Patient Name: [name]. Date of Service: 01/29/2015 2:30 PM Medical Record Number: [id number] Patient Account Number: [id number] Date of Birth/Sex: 03/10/1925 (81 y.o. Male) Treating RN: Junious Dresser Primary Care Physician: Lelon Huh Other Clinician: Referring Physician: Treating Physician/Extender: BURNS, Charlean Sanfilippo in Treatment: 0 Active Problems Location of Pain Severity and Description of Pain Patient Has Paino No Site Locations Pain Management and Medication Current Pain Management: Electronic Signature(s) Signed: 01/29/2015 4:50:44 PM By: Junious Dresser RN Entered By: Junious Dresser on 01/29/2015 14:48:22 [name], [name] ([id number]) -------------------------------------------------------------------------------- Patient/Caregiver Education Details Patient Name: [name]. Date of Service: 01/29/2015 2:30 PM Medical Record Number: [id number] Patient Account Number: [id number] Date of Birth/Gender: 1924-11-30 (81 y.o. Male) Treating RN: Junious Dresser Primary Care Physician: Lelon Huh Other Clinician: Referring Physician: Treating Physician/Extender: BURNS, Charlean Sanfilippo in Treatment: 0 Education Assessment Education Provided To: Patient Education Topics Provided Basic Hygiene: Methods: Explain/Verbal Responses: State content correctly Infection: Methods: Explain/Verbal Responses: State content correctly Safety: Methods: Explain/Verbal Responses: State content correctly Venous: Methods:  Explain/Verbal Responses: State content correctly Wound Debridement: Methods: Explain/Verbal Responses: State content correctly Wound/Skin Impairment: Methods: Explain/Verbal Responses: State content correctly Electronic Signature(s) Signed: 01/29/2015 4:50:44 PM By: Junious Dresser RN Entered By: Junious Dresser on 01/29/2015 15:54:09 [name], [name] ([id number]) -------------------------------------------------------------------------------- Wound Assessment Details Patient Name: [name]. Date of Service: 01/29/2015 2:30 PM Medical Record Number: [id number] Patient Account Number: [id number] Date of Birth/Sex: 1924-11-18 (81 y.o. Male) Treating RN: Junious Dresser Primary Care Physician: Lelon Huh Other Clinician: Referring Physician: Treating Physician/Extender: BURNS, Charlean Sanfilippo in Treatment: 0 Wound Status Wound Number: 1 Primary Diabetic Wound/Ulcer of the Lower Etiology: Extremity Wound Location: Right Lower Leg - Medial Wound Open Wounding Event: Blister Status: Date Acquired: 12/30/2014 Comorbid Cataracts, Sleep Apnea, Arrhythmia, Weeks Of Treatment: 0 History: Congestive Heart Failure, Hypertension, Clustered Wound: No Peripheral Arterial Disease, Type II Diabetes, Gout, Osteoarthritis, Neuropathy Photos Photo Uploaded By: Junious Dresser on 01/29/2015 16:18:05 Wound Measurements Length: (cm) 1.1 Width: (cm) 3.5 Depth: (cm) 0.1 Area: (cm) 3.024 Volume: (cm) 0.302 % Reduction in Area: 0% % Reduction in Volume: 0% Epithelialization: None Tunneling: No Undermining: No Wound Description Classification: Grade 1 Foul Odor After Wound Margin: Distinct, outline attached [name], RECINOS. ([id number]) Cleansing: No Exudate Amount: Medium Exudate Type: Serosanguineous Exudate Color: red, brown Wound Bed Granulation Amount: Medium (34-66%) Exposed Structure Granulation Quality: Red Fascia Exposed: No Necrotic Amount: Medium (34-66%) Fat Layer  Exposed: No Necrotic Quality: Adherent Slough Tendon Exposed: No Muscle Exposed: No Joint Exposed: No Bone Exposed: No Limited to Skin Breakdown Periwound Skin Texture Texture Color No Abnormalities Noted: No No Abnormalities Noted: No Callus: No Atrophie Blanche: No Crepitus: No Cyanosis: No Excoriation: No Ecchymosis: No Fluctuance: No Erythema: Yes Friable: No Hemosiderin Staining: Yes Induration: No Mottled: No Localized Edema: No Pallor: No Rash: No Rubor: No Scarring: No Temperature / Pain Moisture Temperature: No Abnormality No Abnormalities Noted: No Tenderness on Palpation: Yes Dry / Scaly: No Maceration: No Moist: Yes Wound Preparation Ulcer Cleansing: Rinsed/Irrigated with Saline Topical Anesthetic Applied: Other: Lidocaine 4% Ointment , Treatment Notes Wound #1 (Right, Medial Lower Leg) 1. Cleansed with: Clean wound with [name] Saline 2. Anesthetic Topical Lidocaine 4% cream to wound bed prior to debridement 4. Dressing Applied: Prisma Ag 5. Secondary Dressing Applied Bordered Foam Dressing [name], [name] (  [id number]) 7. Secured with Tubigrip 9. Other Orders Culture obtained per physician order Notes tubigrip size E Electronic Signature(s) Signed: 01/29/2015 4:50:44 PM By: Junious Dresser RN Entered By: Junious Dresser on 01/29/2015 14:54:01 Cozza, [name] ([id number]) -------------------------------------------------------------------------------- Vitals Details Patient Name: [name]. Date of Service: 01/29/2015 2:30 PM Medical Record Number: [id number] Patient Account Number: [id number] Date of Birth/Sex: May 20, 1925 (81 y.o. Male) Treating RN: Junious Dresser Primary Care Physician: Lelon Huh Other Clinician: Referring Physician: Treating Physician/Extender: BURNS, Charlean Sanfilippo in Treatment: 0 Vital Signs Time Taken: 14:48 Temperature (F): 98.3 Height (in): 72 Pulse (bpm): 91 Source: Stated Respiratory Rate  (breaths/min): 24 Weight (lbs): 204.1 Blood Pressure (mmHg): 137/69 Source: Measured Reference Range: 80 - 120 mg / dl Body Mass Index (BMI): 27.7 Electronic Signature(s) Signed: 01/29/2015 4:50:44 PM By: Junious Dresser RN Entered By: Junious Dresser on 01/29/2015 14:49:04

## 2015-02-04 LAB — WOUND CULTURE

## 2015-02-05 ENCOUNTER — Encounter: Payer: Medicare Other | Admitting: Surgery

## 2015-02-05 DIAGNOSIS — I129 Hypertensive chronic kidney disease with stage 1 through stage 4 chronic kidney disease, or unspecified chronic kidney disease: Secondary | ICD-10-CM | POA: Diagnosis not present

## 2015-02-05 DIAGNOSIS — I739 Peripheral vascular disease, unspecified: Secondary | ICD-10-CM | POA: Diagnosis not present

## 2015-02-05 DIAGNOSIS — E1143 Type 2 diabetes mellitus with diabetic autonomic (poly)neuropathy: Secondary | ICD-10-CM | POA: Diagnosis not present

## 2015-02-05 DIAGNOSIS — I509 Heart failure, unspecified: Secondary | ICD-10-CM | POA: Diagnosis not present

## 2015-02-05 DIAGNOSIS — E11622 Type 2 diabetes mellitus with other skin ulcer: Secondary | ICD-10-CM | POA: Diagnosis not present

## 2015-02-05 DIAGNOSIS — I70632 Atherosclerosis of nonbiological bypass graft(s) of the right leg with ulceration of calf: Secondary | ICD-10-CM | POA: Diagnosis not present

## 2015-02-05 DIAGNOSIS — E1122 Type 2 diabetes mellitus with diabetic chronic kidney disease: Secondary | ICD-10-CM | POA: Diagnosis not present

## 2015-02-05 DIAGNOSIS — L97219 Non-pressure chronic ulcer of right calf with unspecified severity: Secondary | ICD-10-CM | POA: Diagnosis not present

## 2015-02-05 DIAGNOSIS — R600 Localized edema: Secondary | ICD-10-CM | POA: Diagnosis not present

## 2015-02-05 DIAGNOSIS — I482 Chronic atrial fibrillation: Secondary | ICD-10-CM | POA: Diagnosis not present

## 2015-02-05 DIAGNOSIS — L03115 Cellulitis of right lower limb: Secondary | ICD-10-CM | POA: Diagnosis not present

## 2015-02-05 NOTE — Progress Notes (Signed)
[name], [name] ([id number]) Visit Report for 02/05/2015 Arrival Information Details Patient Name: [name], [name]. Date of Service: 02/05/2015 2:45 PM Medical Record Number: [id number] Patient Account Number: [id number] Date of Birth/Sex: Sep 04, 1925 (81 y.o. Male) Treating RN: Junious Dresser Primary Care Physician: Lelon Huh Other Clinician: Referring Physician: Lelon Huh Treating Physician/Extender: BURNS III, Charlean Sanfilippo in Treatment: 1 Visit Information History Since Last Visit Added or deleted any medications: No Patient Arrived: Ambulatory Any new allergies or adverse reactions: No Arrival Time: 14:51 Had a fall or experienced change in No Accompanied By: self activities of daily living that may affect Transfer Assistance: None risk of falls: Patient Identification Verified: Yes Signs or symptoms of abuse/neglect since last No Secondary Verification Process Yes visito Completed: Hospitalized since last visit: No Patient Has Alerts: Yes Has Dressing in Place as Prescribed: Yes Patient Alerts: Patient on Blood Pain Present Now: Yes Thinner DMII Plavix and ASA 5/16 ABI (L/R)- inaudible Electronic Signature(s) Signed: 02/05/2015 4:44:10 PM By: Junious Dresser RN Entered By: Junious Dresser on 02/05/2015 14:52:20 [name], [name] ([id number]) -------------------------------------------------------------------------------- Encounter Discharge Information Details Patient Name: [name]. Date of Service: 02/05/2015 2:45 PM Medical Record Number: [id number] Patient Account Number: [id number] Date of Birth/Sex: 1925-07-29 (81 y.o. Male) Treating RN: Primary Care Physician: Lelon Huh Other Clinician: Referring Physician: Lelon Huh Treating Physician/Extender: BURNS III, Charlean Sanfilippo in Treatment: 1 Encounter Discharge Information Items Schedule Follow-up Appointment: No Medication Reconciliation completed No and provided to Patient/Care  [name]: Provided on Clinical Summary of Care: 02/05/2015 Form Type Recipient Paper Patient HR Electronic Signature(s) Signed: 02/05/2015 3:25:05 PM By: Ruthine Dose Entered By: Ruthine Dose on 02/05/2015 15:25:04 [name], [name] ([id number]) -------------------------------------------------------------------------------- Lower Extremity Assessment Details Patient Name: [name]. Date of Service: 02/05/2015 2:45 PM Medical Record Number: [id number] Patient Account Number: [id number] Date of Birth/Sex: 1925-04-11 (81 y.o. Male) Treating RN: Junious Dresser Primary Care Physician: Lelon Huh Other Clinician: Referring Physician: Lelon Huh Treating Physician/Extender: BURNS III, Charlean Sanfilippo in Treatment: 1 Edema Assessment Assessed: [Left: No] [Right: Yes] Edema: [Left: Ye] [Right: s] Calf Left: Right: Point of Measurement: 35 cm From Medial Instep cm 36.8 cm Ankle Left: Right: Point of Measurement: 12 cm From Medial Instep cm 22.8 cm Vascular Assessment Claudication: Claudication Assessment [Right:None] Pulses: Posterior Tibial Palpable: [Right:No] Doppler: [Right:Monophasic] Dorsalis Pedis Palpable: [Right:No] Doppler: [Right:Inaudible] Extremity colors, hair growth, and conditions: Extremity Color: [Right:Red] Hair Growth on Extremity: [Right:No] Temperature of Extremity: [Right:Cool] Capillary Refill: [Right:< 3 seconds] Dependent Rubor: [Right:No] Blanched when Elevated: [Right:No] Toe Nail Assessment Left: Right: Thick: Yes Discolored: Yes Deformed: No Improper Length and Hygiene: No [name], [name] ([id number]) Electronic Signature(s) Signed: 02/05/2015 4:44:10 PM By: Junious Dresser RN Entered By: Junious Dresser on 02/05/2015 15:02:02 [name], [name] ([id number]) -------------------------------------------------------------------------------- Multi Wound Chart Details Patient Name: [name]. Date of Service: 02/05/2015 2:45  PM Medical Record Number: [id number] Patient Account Number: [id number] Date of Birth/Sex: 28-Oct-1924 (81 y.o. Male) Treating RN: Junious Dresser Primary Care Physician: Lelon Huh Other Clinician: Referring Physician: Lelon Huh Treating Physician/Extender: BURNS III, Charlean Sanfilippo in Treatment: 1 Vital Signs Height(in): 72 Pulse(bpm): 71 Weight(lbs): 204.1 Blood Pressure 132/66 (mmHg): Body Mass Index(BMI): 28 Temperature(F): 98.2 Respiratory Rate 24 (breaths/min): Photos: [1:No Photos] [N/A:N/A] Wound Location: [1:Right Lower Leg - Medial N/A] Wounding Event: [1:Blister] [N/A:N/A] Primary Etiology: [1:Diabetic Wound/Ulcer of N/A the Lower Extremity] Comorbid History: [1:Cataracts, Sleep Apnea, N/A Arrhythmia, Congestive Heart Failure, Hypertension, Peripheral Arterial Disease, Type II Diabetes, Gout, Osteoarthritis, Neuropathy] Date Acquired: [1:12/30/2014] [N/A:N/A] Weeks  of Treatment: [1:1] [N/A:N/A] Wound Status: [1:Open] [N/A:N/A] Measurements L x W x D 1.5x3.5x0.1 [N/A:N/A] (cm) Area (cm) : [1:4.123] [N/A:N/A] Volume (cm) : [1:0.412] [N/A:N/A] % Reduction in Area: [1:-36.30%] [N/A:N/A] % Reduction in Volume: -36.40% [N/A:N/A] Classification: [1:Grade 1] [N/A:N/A] Exudate Amount: [1:Medium] [N/A:N/A] Exudate Type: [1:Serosanguineous] [N/A:N/A] Exudate Color: [1:red, brown] [N/A:N/A] Wound Margin: [1:Distinct, outline attached N/A] Granulation Amount: [1:Small (1-33%)] [N/A:N/A] Granulation Quality: [1:Red, Pink] [N/A:N/A] Necrotic Amount: [1:Large (67-100%)] [N/A:N/A] Exposed Structures: [N/A:N/A] Fascia: No Fat: No Tendon: No Muscle: No Joint: No Bone: No Limited to Skin Breakdown Epithelialization: Small (1-33%) N/A N/A Periwound Skin Texture: Edema: No N/A N/A Excoriation: No Induration: No Callus: No Crepitus: No Fluctuance: No Friable: No Rash: No Scarring: No Periwound Skin Moist: Yes N/A N/A Moisture: Maceration: No Dry/Scaly:  No Periwound Skin Color: Erythema: Yes N/A N/A Hemosiderin Staining: Yes Atrophie Blanche: No Cyanosis: No Ecchymosis: No Mottled: No Pallor: No Rubor: No Erythema Location: Circumferential N/A N/A Temperature: No Abnormality N/A N/A Tenderness on Yes N/A N/A Palpation: Wound Preparation: Ulcer Cleansing: N/A N/A Rinsed/Irrigated with Saline Topical Anesthetic Applied: Other: Lidocaine 4% Ointment Treatment Notes Electronic Signature(s) Signed: 02/05/2015 4:44:10 PM By: Junious Dresser RN Entered By: Junious Dresser on 02/05/2015 [name], [name] ([id number]) -------------------------------------------------------------------------------- [place] Details Patient Name: [name]. Date of Service: 02/05/2015 2:45 PM Medical Record Number: [id number] Patient Account Number: [id number] Date of Birth/Sex: 1925/06/06 (81 y.o. Male) Treating RN: Junious Dresser Primary Care Physician: Lelon Huh Other Clinician: Referring Physician: Lelon Huh Treating Physician/Extender: BURNS III, Charlean Sanfilippo in Treatment: 1 Active Inactive Abuse / Safety / Falls / Self Care Management Nursing Diagnoses: Impaired home maintenance Impaired physical mobility Knowledge deficit related to: safety; personal, health (wound), emergency Potential for falls Self care deficit: actual or potential Goals: Patient will remain injury free Date Initiated: 01/29/2015 Goal Status: Active Patient/caregiver will verbalize understanding of skin care regimen Date Initiated: 01/29/2015 Goal Status: Active Patient/caregiver will verbalize/demonstrate measure taken to improve self care Date Initiated: 01/29/2015 Goal Status: Active Patient/caregiver will verbalize/demonstrate measures taken to improve the patient's personal safety Date Initiated: 01/29/2015 Goal Status: Active Patient/caregiver will verbalize/demonstrate measures taken to prevent injury and/or falls Date  Initiated: 01/29/2015 Goal Status: Active Patient/caregiver will verbalize/demonstrate understanding of what to do in case of emergency Date Initiated: 01/29/2015 Goal Status: Active Interventions: Assess: immobility, friction, shearing, incontinence upon admission and as needed Assess impairment of mobility on admission and as needed per policy Assess self care needs on admission and as needed Patient referred to community resources (specify in notes) Provide education on basic hygiene [name], [name] ([id number]) Provide education on fall prevention Provide education on personal and home safety Provide education on safe transfers Treatment Activities: Education provided on Basic Hygiene : 01/29/2015 Notes: Orientation to the Wound Care Program Nursing Diagnoses: Knowledge deficit related to the wound healing center program Goals: Patient/caregiver will verbalize understanding of the [facility] Date Initiated: 01/29/2015 Goal Status: Active Interventions: Provide education on orientation to the wound center Notes: Venous Leg Ulcer Nursing Diagnoses: Knowledge deficit related to disease process and management Potential for venous Insuffiency (use before diagnosis confirmed) Goals: Patient will maintain optimal edema control Date Initiated: 01/29/2015 Goal Status: Active Patient/caregiver will verbalize understanding of disease process and disease management Date Initiated: 01/29/2015 Goal Status: Active Verify adequate tissue perfusion prior to therapeutic compression application Date Initiated: 01/29/2015 Goal Status: Active Interventions: Assess peripheral edema status every visit. Compression as ordered Provide education on venous insufficiency Notes: Risk,  [name] ([id number]) Wound/Skin Impairment Nursing Diagnoses: Impaired tissue integrity Knowledge deficit related to smoking impact on wound healing Knowledge deficit related to  ulceration/compromised skin integrity Goals: Patient/caregiver will verbalize understanding of skin care regimen Date Initiated: 01/29/2015 Goal Status: Active Ulcer/skin breakdown will have a volume reduction of 30% by week 4 Date Initiated: 01/29/2015 Goal Status: Active Ulcer/skin breakdown will have a volume reduction of 50% by week 8 Date Initiated: 01/29/2015 Goal Status: Active Ulcer/skin breakdown will have a volume reduction of 80% by week 12 Date Initiated: 01/29/2015 Goal Status: Active Ulcer/skin breakdown will heal within 14 weeks Date Initiated: 01/29/2015 Goal Status: Active Interventions: Assess patient/caregiver ability to obtain necessary supplies Assess patient/caregiver ability to perform ulcer/skin care regimen upon admission and as needed Assess ulceration(s) every visit Provide education on ulcer and skin care Notes: Electronic Signature(s) Signed: 02/05/2015 4:44:10 PM By: Junious Dresser RN Entered By: Junious Dresser on 02/05/2015 15:17:03 [name], [name] ([id number]) -------------------------------------------------------------------------------- Pain Assessment Details Patient Name: [name]. Date of Service: 02/05/2015 2:45 PM Medical Record Number: [id number] Patient Account Number: [id number] Date of Birth/Sex: 1924/10/13 (81 y.o. Male) Treating RN: Junious Dresser Primary Care Physician: Lelon Huh Other Clinician: Referring Physician: Lelon Huh Treating Physician/Extender: BURNS III, Charlean Sanfilippo in Treatment: 1 Active Problems Location of Pain Severity and Description of Pain Patient Has Paino Yes Site Locations With Dressing Change: No Duration of the Pain. Constant / Intermittento Constant Rate the pain. Current Pain Level: 7 Worst Pain Level: 7 Least Pain Level: 2 Pain Management and Medication Current Pain Management: Electronic Signature(s) Signed: 02/05/2015 4:44:10 PM By: Junious Dresser RN Entered By: Junious Dresser on  02/05/2015 14:54:11 [name], [name] ([id number]) -------------------------------------------------------------------------------- Patient/Caregiver Education Details Patient Name: [name]. Date of Service: 02/05/2015 2:45 PM Medical Record Number: [id number] Patient Account Number: [id number] Date of Birth/Gender: [birth year] (81 y.o. Male) Treating RN: Junious Dresser Primary Care Physician: Lelon Huh Other Clinician: Referring Physician: Lelon Huh Treating Physician/Extender: BURNS III, Charlean Sanfilippo in Treatment: 1 Education Assessment Education Provided To: Patient Education Topics Provided Basic Hygiene: Methods: Explain/Verbal Responses: State content correctly Infection: Methods: Explain/Verbal Responses: State content correctly Safety: Methods: Explain/Verbal Responses: State content correctly Venous: Methods: Explain/Verbal Responses: State content correctly Wound Debridement: Methods: Explain/Verbal Responses: State content correctly Wound/Skin Impairment: Methods: Explain/Verbal Responses: State content correctly Electronic Signature(s) Signed: 02/05/2015 4:44:10 PM By: Junious Dresser RN Entered By: Junious Dresser on 02/05/2015 15:21:11 [name], [name] ([id number]) -------------------------------------------------------------------------------- Wound Assessment Details Patient Name: [name]. Date of Service: 02/05/2015 2:45 PM Medical Record Number: [id number] Patient Account Number: [id number] Date of Birth/Sex: 21-Jul-1925 (81 y.o. Male) Treating RN: Junious Dresser Primary Care Physician: Lelon Huh Other Clinician: Referring Physician: Lelon Huh Treating Physician/Extender: BURNS III, Charlean Sanfilippo in Treatment: 1 Wound Status Wound Number: 1 Primary Diabetic Wound/Ulcer of the Lower Etiology: Extremity Wound Location: Right Lower Leg - Medial Wound Open Wounding Event: Blister Status: Date Acquired: 12/30/2014 Comorbid  Cataracts, Sleep Apnea, Arrhythmia, Weeks Of Treatment: 1 History: Congestive Heart Failure, Hypertension, Clustered Wound: No Peripheral Arterial Disease, Type II Diabetes, Gout, Osteoarthritis, Neuropathy Photos Photo Uploaded By: Junious Dresser on 02/05/2015 16:42:03 Wound Measurements Length: (cm) 1.5 Width: (cm) 3.5 Depth: (cm) 0.1 Area: (cm) 4.123 Volume: (cm) 0.412 % Reduction in Area: -36.3% % Reduction in Volume: -36.4% Epithelialization: Small (1-33%) Tunneling: No Undermining: No Wound Description Classification: Grade 1 Foul Odor Afte Wound Margin: Distinct, outline attached Dunson, Renato H. ([id number]) r Cleansing: No Exudate Amount: Medium Exudate Type: Serosanguineous Exudate Color: red,  brown Wound Bed Granulation Amount: Small (1-33%) Exposed Structure Granulation Quality: Red, Pink Fascia Exposed: No Necrotic Amount: Large (67-100%) Fat Layer Exposed: No Necrotic Quality: Adherent Slough Tendon Exposed: No Muscle Exposed: No Joint Exposed: No Bone Exposed: No Limited to Skin Breakdown Periwound Skin Texture Texture Color No Abnormalities Noted: No No Abnormalities Noted: No Callus: No Atrophie Blanche: No Crepitus: No Cyanosis: No Excoriation: No Ecchymosis: No Fluctuance: No Erythema: Yes Friable: No Erythema Location: Circumferential Induration: No Hemosiderin Staining: Yes Localized Edema: No Mottled: No Rash: No Pallor: No Scarring: No Rubor: No Moisture Temperature / Pain No Abnormalities Noted: No Temperature: No Abnormality Dry / Scaly: No Tenderness on Palpation: Yes Maceration: No Moist: Yes Wound Preparation Ulcer Cleansing: Rinsed/Irrigated with Saline Topical Anesthetic Applied: Other: Lidocaine 4% Ointment , Treatment Notes Wound #1 (Right, Medial Lower Leg) 1. Cleansed with: Clean wound with Normal Saline 2. Anesthetic Topical Lidocaine 4% cream to wound bed prior to debridement 4. Dressing  Applied: Prisma Ag 5. Secondary Dressing Applied Bordered Foam Dressing Desir, Keidan H. ([id number]) 7. Secured with Tubigrip Notes tubigrip size E Electronic Signature(s) Signed: 02/05/2015 4:44:10 PM By: Junious Dresser RN Entered By: Junious Dresser on 02/05/2015 15:04:59 Mirando, [name] ([id number]) -------------------------------------------------------------------------------- Vitals Details Patient Name: [name]. Date of Service: 02/05/2015 2:45 PM Medical Record Number: [id number] Patient Account Number: [id number] Date of Birth/Sex: May 02, 1925 (81 y.o. Male) Treating RN: Junious Dresser Primary Care Physician: Lelon Huh Other Clinician: Referring Physician: Lelon Huh Treating Physician/Extender: BURNS III, Charlean Sanfilippo in Treatment: 1 Vital Signs Time Taken: 14:55 Temperature (F): 98.2 Height (in): 72 Pulse (bpm): 71 Weight (lbs): 204.1 Respiratory Rate (breaths/min): 24 Body Mass Index (BMI): 27.7 Blood Pressure (mmHg): 132/66 Reference Range: 80 - 120 mg / dl Electronic Signature(s) Signed: 02/05/2015 4:44:10 PM By: Junious Dresser RN Entered By: Junious Dresser on 02/05/2015 14:55:14

## 2015-02-05 NOTE — Progress Notes (Signed)
[name] ([id number]) Visit Report for 02/05/2015 Chief Complaint Document Details Patient Name: [name], [name]. Date of Service: 02/05/2015 2:45 PM Medical Record Number: [id number] Patient Account Number: [id number] Date of Birth/Sex: 09/27/1924 (81 y.o. Male) Treating RN: Primary Care Physician: Lelon Huh Other Clinician: Referring Physician: Lelon Huh Treating Physician/Extender: BURNS III, Charlean Sanfilippo in Treatment: 1 Information Obtained from: Patient Chief Complaint Right calf ulcer. Arterial insufficiency. Electronic Signature(s) Signed: 02/05/2015 4:38:40 PM By: Loletha Grayer MD Entered By: Loletha Grayer on 02/05/2015 15:30:21 [name], [name] ([id number]) -------------------------------------------------------------------------------- Debridement Details Patient Name: [name]. Date of Service: 02/05/2015 2:45 PM Medical Record Number: [id number] Patient Account Number: [id number] Date of Birth/Sex: [birth year] (81 y.o. Male) Treating RN: Primary Care Physician: Lelon Huh Other Clinician: Referring Physician: Lelon Huh Treating Physician/Extender: BURNS III, Charlean Sanfilippo in Treatment: 1 Debridement Performed for Wound #1 Right,Medial Lower Leg Assessment: Performed By: Physician BURNS III, Derrian Poli, Debridement: Open Wound/Selective Debridement Selective Description: Pre-procedure Yes Verification/Time Out Taken: Start Time: 15:17 Pain Control: Lidocaine 4% Topical Solution Level: Non-Viable Tissue Total Area Debrided (L x 1.5 (cm) x 3.5 (cm) = 5.25 (cm) W): Tissue and other Non-Viable, Fibrin/Slough material debrided: Instrument: Curette Bleeding: Minimum Hemostasis Achieved: Pressure End Time: 15:18 Response to Treatment: Procedure was tolerated well Post Debridement Measurements of Total Wound Length: (cm) 1.5 Width: (cm) 3.5 Depth: (cm) 0.2 Volume: (cm) 0.825 Electronic Signature(s) Signed: 02/05/2015  4:38:40 PM By: Loletha Grayer MD Entered By: Loletha Grayer on 02/05/2015 15:30:10 [name], [name] ([id number]) -------------------------------------------------------------------------------- HPI Details Patient Name: [name]. Date of Service: 02/05/2015 2:45 PM Medical Record Number: [id number] Patient Account Number: [id number] Date of Birth/Sex: [birth year] (81 y.o. Male) Treating RN: Primary Care Physician: Lelon Huh Other Clinician: Referring Physician: Lelon Huh Treating Physician/Extender: BURNS III, Charlean Sanfilippo in Treatment: 1 History of Present Illness HPI Description: Pleasant 81 year old with history of diabetes (hemoglobin A1c 8.6 in April 2016), peripheral neuropathy, peripheral vascular disease (status post bilateral lower extremity stents per patient), atrial fibrillation, and chronic kidney disease. He developed a blister on his right medial calf in April 2016 and a subsequent ulceration. No significant improvement with Silvadene. He reports mild pain with pressure. No claudication or rest pain. Ambulating normally per his baseline. Unable to obtain an ABI. Has seen Dr. Lucky Cowboy in the past and is scheduled to see him again in June. Culture grew Candida. No antibiotics. Denies fever or chills. No significant drainage. Electronic Signature(s) Signed: 02/05/2015 4:38:40 PM By: Loletha Grayer MD Entered By: Loletha Grayer on 02/05/2015 15:31:19 [name], [name] ([id number]) -------------------------------------------------------------------------------- Physical Exam Details Patient Name: [name]. Date of Service: 02/05/2015 2:45 PM Medical Record Number: [id number] Patient Account Number: [id number] Date of Birth/Sex: [birth year] (81 y.o. Male) Treating RN: Primary Care Physician: Lelon Huh Other Clinician: Referring Physician: Lelon Huh Treating Physician/Extender: BURNS III, Damione Marken Weeks in Treatment:  1 Constitutional . Pulse regular. Respirations normal and unlabored. Afebrile. . Notes Right medial calf ulceration. Full-thickness. Eschar and underlying biofilm excised. Mild surrounding erythema and cellulitis. Tender. 1+ pitting edema, localized. No palpable pedal pulses. No dopplerable DP. Faintly dopplerable, monophasic PT. No toe signal. Foot cool to touch. Capillary refill greater than 1 second. Electronic Signature(s) Signed: 02/05/2015 4:38:40 PM By: Loletha Grayer MD Entered By: Loletha Grayer on 02/05/2015 15:31:58 [name], [name] ([id number]) -------------------------------------------------------------------------------- Physician Orders Details Patient Name: [name]. Date of Service: 02/05/2015 2:45 PM Medical Record Number: [id number] Patient Account Number: [id number] Date of  Birth/Sex: 1925/04/16 (81 y.o. Male) Treating RN: Junious Dresser Primary Care Physician: Lelon Huh Other Clinician: Referring Physician: Lelon Huh Treating Physician/Extender: BURNS III, Charlean Sanfilippo in Treatment: 1 Verbal / Phone Orders: Yes Clinician: Junious Dresser Read Back and Verified: Yes Diagnosis Coding Wound Cleansing Wound #1 Right,Medial Lower Leg o Cleanse wound with mild soap and water o May Shower, gently pat wound dry prior to applying new dressing. o May shower with protection. Anesthetic Wound #1 Right,Medial Lower Leg o Topical Lidocaine 4% cream applied to wound bed prior to debridement Primary Wound Dressing Wound #1 Right,Medial Lower Leg o Prisma Ag Secondary Dressing Wound #1 Right,Medial Lower Leg o Boardered Foam Dressing Dressing Change Frequency Wound #1 Right,Medial Lower Leg o Change dressing every other day. Follow-up Appointments Wound #1 Right,Medial Lower Leg o Return Appointment in 1 week. Edema Control Wound #1 Right,Medial Lower Leg o Tubigrip Additional Orders / Instructions Wound #1 Right,Medial  Lower Leg o Increase protein intake. o Activity as tolerated [name], [name] ([id number]) Medications-please add to medication list. Wound #1 Right,Medial Lower Leg o P.O. Antibiotics - start Doxycyline 100 PO BID x 1 week prescribed last week Electronic Signature(s) Signed: 02/05/2015 4:44:10 PM By: Junious Dresser RN Entered By: Junious Dresser on 02/05/2015 15:19:36 Corwin, [name] ([id number]) -------------------------------------------------------------------------------- Problem List Details Patient Name: [name] Puff H. Date of Service: 02/05/2015 2:45 PM Medical Record Number: [id number] Patient Account Number: [id number] Date of Birth/Sex: Jul 03, 1925 (81 y.o. 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 Date of Service: 02/05/2015 2:45 PM Medical Record Number: [id number] Patient Account Number: [id number] Date of Birth/Sex: [birth year] (81 y.o. Male) Treating RN: Primary Care  Physician: Lelon Huh Other Clinician: Referring Physician: Lelon Huh Treating Physician/Extender: BURNS III, Charlean Sanfilippo in Treatment: 1 Subjective Chief Complaint Information obtained from Patient Right calf ulcer. Arterial insufficiency. History of Present Illness (HPI) Pleasant 81 year old with history of diabetes (hemoglobin A1c 8.6 in April 2016), peripheral neuropathy, peripheral vascular disease (status post bilateral lower extremity stents per patient), atrial fibrillation, and chronic kidney disease. He developed a blister on his right medial calf in April 2016 and a subsequent ulceration. No significant improvement with Silvadene. He reports mild pain with pressure. No claudication or rest pain. Ambulating normally per his baseline. Unable to obtain an ABI. Has seen Dr. Lucky Cowboy in the past and is scheduled to see him again in June. Culture grew Candida. No antibiotics. Denies fever or chills. No significant drainage. Objective Constitutional Pulse regular. Respirations normal and unlabored. Afebrile. Vitals Time Taken: 2:55 PM, Height: 72 in, Weight: 204.1 lbs, BMI: 27.7, Temperature: 98.2 F, Pulse: 71 bpm, Respiratory Rate: 24 breaths/min, Blood Pressure: 132/66 mmHg. General Notes: Right medial calf ulceration. Full-thickness. Eschar and underlying biofilm excised. Mild surrounding erythema and cellulitis. Tender. 1+ pitting  edema, localized. No palpable pedal pulses. No dopplerable DP. Faintly dopplerable, monophasic PT. No toe signal. Foot cool to touch. Capillary refill greater than 1 second. Integumentary (Hair, Skin) Wound #1 status is Open. Original cause of wound was Blister. The wound is located on the Right,Medial Lower Leg. The wound measures 1.5cm length x 3.5cm width x 0.1cm depth; 4.123cm^2 area and [name], [name] ([id number]) 0.412cm^3 volume. The wound is limited to skin breakdown. There is no tunneling or undermining noted. There is a medium  amount of serosanguineous drainage noted. The wound margin is distinct with the outline attached to the wound base. There is small (1-33%) red, pink granulation within the wound bed. There is a large (67-100%) amount of necrotic tissue within the wound bed including Adherent Slough. The periwound skin appearance exhibited: Moist, Hemosiderin Staining, Erythema. The periwound skin appearance did not exhibit: Callus, Crepitus, Excoriation, Fluctuance, Friable, Induration, Localized Edema, Rash, Scarring, Dry/Scaly, Maceration, Atrophie Blanche, Cyanosis, Ecchymosis, Mottled, Pallor, Rubor. The surrounding wound skin color is noted with erythema which is circumferential. Periwound temperature was noted as No Abnormality. The periwound has tenderness on palpation. 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 Arterial insufficiency. Procedures Wound #1 Wound #1 is a Diabetic Wound/Ulcer of the Lower Extremity located on the Right,Medial Lower Leg . There was a Non-Viable Tissue Open Wound/Selective [phone]) debridement with total area of 5.25 sq cm performed by BURNS III, Mirka Barbone. with the following instrument(s): Curette to remove Non-Viable tissue/material including Fibrin/Slough after achieving pain control using Lidocaine 4% Topical Solution. A time out was conducted prior to the start of the procedure. A Minimum amount of bleeding was controlled with Pressure. The procedure was tolerated well. Post Debridement Measurements: 1.5cm length x 3.5cm width x 0.2cm depth; 0.825cm^3 volume. Plan [name], [name] ([id number]) Wound Cleansing: Wound #1 Right,Medial Lower Leg: Cleanse wound with mild soap and water May Shower, gently pat wound dry prior to applying new dressing. May shower with protection. Anesthetic: Wound #1 Right,Medial Lower Leg: Topical Lidocaine 4% cream applied to wound bed prior to debridement Primary Wound Dressing: Wound #1 Right,Medial Lower Leg: Prisma Ag Secondary Dressing: Wound #1 Right,Medial Lower Leg: Boardered Foam Dressing Dressing Change Frequency: Wound #1 Right,Medial Lower Leg: Change dressing every other day. Follow-up Appointments: Wound #1 Right,Medial Lower Leg: Return Appointment in 1 week. Edema Control: Wound #1 Right,Medial Lower Leg: Tubigrip Additional Orders / Instructions: Wound #1 Right,Medial Lower Leg: Increase protein intake. Activity as tolerated Medications-please add to medication list.: Wound #1 Right,Medial Lower Leg: P.O. Antibiotics - start Doxycyline 100 PO BID x 1 week prescribed last week Prisma. Tubigrip. Rewrote a prescription for doxycycline. Scheduled to see Dr. Lucky Cowboy on 02/21/2015. Electronic Signature(s) Signed: 02/05/2015 4:38:40 PM By: Loletha Grayer MD Entered By: Loletha Grayer on 02/05/2015 15:32:59 Thornell, [name] ([id number]) -------------------------------------------------------------------------------- SuperBill Details Patient Name: [name]. Date of Service: 02/05/2015 Medical Record Number: [id number] Patient Account Number: [id number] Date of Birth/Sex: 10-24-1924 (81 y.o. Male) Treating RN: Primary Care Physician: Lelon Huh Other Clinician: Referring Physician: Lelon Huh Treating Physician/Extender: BURNS III, Charlean Sanfilippo in Treatment: 1 Diagnosis Coding ICD-10 Codes Code Description [phone] Atherosclerosis of nonbiological  bypass graft(s) of the right leg with ulceration of calf E11.622 Type 2 diabetes mellitus with other skin ulcer E11.43 Type 2 diabetes  mellitus with diabetic autonomic (poly)neuropathy E11.22 Type 2 diabetes mellitus with diabetic chronic kidney disease I48.2 Chronic atrial fibrillation L03.115 Cellulitis of right lower limb Facility Procedures CPT4 Code: [id number] Description: 78938 - DEBRIDE WOUND 1ST 20 SQ CM OR < ICD-10 Description Diagnosis E11.622 Type 2 diabetes mellitus with other skin ulcer Modifier: Quantity: 1 Physician Procedures CPT4 Code: [id number] Description: 25852 - WC PHYS DEBR WO ANESTH 20 SQ CM ICD-10 Description Diagnosis E11.622 Type 2 diabetes mellitus with other skin ulcer Modifier: Quantity: 1 Electronic Signature(s) Signed: 02/05/2015 4:38:40 PM By: Loletha Grayer MD Entered By: Loletha Grayer on 02/05/2015 15:33:14

## 2015-02-07 DIAGNOSIS — I739 Peripheral vascular disease, unspecified: Secondary | ICD-10-CM | POA: Diagnosis not present

## 2015-02-07 DIAGNOSIS — I724 Aneurysm of artery of lower extremity: Secondary | ICD-10-CM | POA: Diagnosis not present

## 2015-02-07 DIAGNOSIS — E1159 Type 2 diabetes mellitus with other circulatory complications: Secondary | ICD-10-CM | POA: Diagnosis not present

## 2015-02-07 DIAGNOSIS — E780 Pure hypercholesterolemia: Secondary | ICD-10-CM | POA: Diagnosis not present

## 2015-02-12 ENCOUNTER — Encounter: Payer: Medicare Other | Attending: Surgery | Admitting: Surgery

## 2015-02-12 DIAGNOSIS — I482 Chronic atrial fibrillation: Secondary | ICD-10-CM | POA: Insufficient documentation

## 2015-02-12 DIAGNOSIS — L97219 Non-pressure chronic ulcer of right calf with unspecified severity: Secondary | ICD-10-CM | POA: Insufficient documentation

## 2015-02-12 DIAGNOSIS — I70632 Atherosclerosis of nonbiological bypass graft(s) of the right leg with ulceration of calf: Secondary | ICD-10-CM | POA: Diagnosis not present

## 2015-02-12 DIAGNOSIS — E11622 Type 2 diabetes mellitus with other skin ulcer: Secondary | ICD-10-CM | POA: Diagnosis not present

## 2015-02-12 DIAGNOSIS — I771 Stricture of artery: Secondary | ICD-10-CM | POA: Diagnosis not present

## 2015-02-12 DIAGNOSIS — S80821A Blister (nonthermal), right lower leg, initial encounter: Secondary | ICD-10-CM | POA: Diagnosis not present

## 2015-02-12 DIAGNOSIS — E1122 Type 2 diabetes mellitus with diabetic chronic kidney disease: Secondary | ICD-10-CM | POA: Insufficient documentation

## 2015-02-12 DIAGNOSIS — L03115 Cellulitis of right lower limb: Secondary | ICD-10-CM | POA: Diagnosis not present

## 2015-02-12 DIAGNOSIS — N189 Chronic kidney disease, unspecified: Secondary | ICD-10-CM | POA: Diagnosis not present

## 2015-02-12 DIAGNOSIS — E1143 Type 2 diabetes mellitus with diabetic autonomic (poly)neuropathy: Secondary | ICD-10-CM | POA: Insufficient documentation

## 2015-02-12 DIAGNOSIS — X58XXXA Exposure to other specified factors, initial encounter: Secondary | ICD-10-CM | POA: Diagnosis not present

## 2015-02-13 NOTE — Progress Notes (Signed)
[name], [name] ([id number]) Visit Report for 02/12/2015 Arrival Information Details Patient Name: [name], [name]. Date of Service: 02/12/2015 3:15 PM Medical Record Number: [id number] Patient Account Number: [id number] Date of Birth/Sex: September 27, 1924 (81 y.o. Male) Treating RN: [name] Primary Care Physician: Lelon Huh Other Clinician: Referring Physician: Lelon Huh Treating Physician/Extender: BURNS, Charlean Sanfilippo in Treatment: 2 Visit Information History Since Last Visit Added or deleted any medications: Yes Patient Arrived: Ambulatory Any new allergies or adverse reactions: No Arrival Time: 15:18 Had a fall or experienced change in No Accompanied By: self activities of daily living that may affect Transfer Assistance: None risk of falls: Patient Identification Verified: Yes Signs or symptoms of abuse/neglect since last No Secondary Verification Process Yes visito Completed: Hospitalized since last visit: No Patient Has Alerts: Yes Has Dressing in Place as Prescribed: Yes Patient Alerts: Patient on Blood Pain Present Now: No Thinner DMII Plavix and ASA 5/16 ABI (L/R)- inaudible Electronic Signature(s) Signed: 02/12/2015 5:03:50 PM By: Gretta Cool, RN, BSN, Kim RN, BSN Entered By: Gretta Cool, RN, BSN, Kim on 02/12/2015 15:19:14 [name], [name] ([id number]) -------------------------------------------------------------------------------- Encounter Discharge Information Details Patient Name: [name]. Date of Service: 02/12/2015 3:15 PM Medical Record Number: [id number] Patient Account Number: [id number] Date of Birth/Sex: 02-13-1925 (81 y.o. Male) Treating RN: [name] Primary Care Physician: Lelon Huh Other Clinician: Referring Physician: Lelon Huh Treating Physician/Extender: BURNS, Charlean Sanfilippo in Treatment: 2 Encounter Discharge Information Items Discharge Pain Level: 0 Discharge Condition: Stable Ambulatory Status: Ambulatory Discharge  Destination: Home Transportation: Private Auto Accompanied By: self Schedule Follow-up Appointment: Yes Medication Reconciliation completed and provided to Patient/Care Yes Juvencio Verdi: Provided on Clinical Summary of Care: 02/12/2015 Form Type Recipient Paper Patient HR Electronic Signature(s) Signed: 02/12/2015 4:03:52 PM By: Ruthine Dose Entered By: Ruthine Dose on 02/12/2015 16:03:52 [name], [name] ([id number]) -------------------------------------------------------------------------------- Lower Extremity Assessment Details Patient Name: [name]. Date of Service: 02/12/2015 3:15 PM Medical Record Number: [id number] Patient Account Number: [id number] Date of Birth/Sex: July 23, 1925 (81 y.o. Male) Treating RN: [name] Primary Care Physician: Lelon Huh Other Clinician: Referring Physician: Lelon Huh Treating Physician/Extender: BURNS, Charlean Sanfilippo in Treatment: 2 Edema Assessment Assessed: [Left: No] [Right: No] Edema: [Left: No] [Right: No] Calf Left: Right: Point of Measurement: 35 cm From Medial Instep cm 35.7 cm Ankle Left: Right: Point of Measurement: 12 cm From Medial Instep cm 23 cm Vascular Assessment Pulses: Posterior Tibial Palpable: [Right:Yes] Dorsalis Pedis Palpable: [Right:Yes] Extremity colors, hair growth, and conditions: Extremity Color: [Right:Red] Hair Growth on Extremity: [Right:No] Temperature of Extremity: [Right:Cool] Capillary Refill: [Right:< 3 seconds] Toe Nail Assessment Left: Right: Thick: No Discolored: No Deformed: No Improper Length and Hygiene: No Electronic Signature(s) Signed: 02/12/2015 5:03:50 PM By: Gretta Cool, RN, BSN, Kim RN, BSN Entered By: Gretta Cool, RN, BSN, Kim on 02/12/2015 15:24:45 [name], [name] ([id number]) [name], [name] ([id number]) -------------------------------------------------------------------------------- Multi Wound Chart Details Patient Name: [name]. Date of Service: 02/12/2015  3:15 PM Medical Record Number: [id number] Patient Account Number: [id number] Date of Birth/Sex: [birth year] (81 y.o. Male) Treating RN: [name] Primary Care Physician: Lelon Huh Other Clinician: Referring Physician: Lelon Huh Treating Physician/Extender: BURNS, Charlean Sanfilippo in Treatment: 2 Vital Signs Height(in): 72 Pulse(bpm): 68 Weight(lbs): 202.1 Blood Pressure 135/66 (mmHg): Body Mass Index(BMI): 27 Temperature(F): 98.0 Respiratory Rate 20 (breaths/min): Photos: [N/A:N/A] Wound Location: Right Lower Leg - Medial Right Lower Leg - Medial N/A Wounding Event: Blister Blister N/A Primary Etiology: Diabetic Wound/Ulcer of Diabetic Wound/Ulcer of N/A the Lower Extremity the Lower Extremity Comorbid History: Cataracts,  Sleep Apnea, Cataracts, Sleep Apnea, N/A Arrhythmia, Congestive Arrhythmia, Congestive Heart Failure, Heart Failure, Hypertension, Peripheral Hypertension, Peripheral Arterial Disease, Type II Arterial Disease, Type II Diabetes, Gout, Diabetes, Gout, Osteoarthritis, Neuropathy Osteoarthritis, Neuropathy Date Acquired: 12/30/2014 02/12/2015 N/A Weeks of Treatment: 2 0 N/A Wound Status: Open Open N/A Measurements L x W x D 1.4x3.5x0.1 0.8x0.6x0.1 N/A (cm) Area (cm) : 3.848 0.377 N/A Volume (cm) : 0.385 0.038 N/A % Reduction in Area: -27.20% 0.00% N/A % Reduction in Volume: -27.50% 0.00% N/A Classification: Grade 1 Grade 0 N/A Exudate Amount: Small None Present N/A Exudate Type: Serosanguineous N/A N/A [name], [name] ([id number]) Exudate Color: red, brown N/A N/A Wound Margin: Distinct, outline attached N/A N/A Granulation Amount: Small (1-33%) Large (67-100%) N/A Granulation Quality: Red N/A N/A Necrotic Amount: Large (67-100%) None Present (0%) N/A Exposed Structures: Fascia: No Fascia: No N/A Fat: No Fat: No Tendon: No Tendon: No Muscle: No Muscle: No Joint: No Joint: No Bone: No Bone: No Limited to Skin Limited to  Skin Breakdown Breakdown Epithelialization: Small (1-33%) N/A N/A Periwound Skin Texture: Edema: No No Abnormalities Noted N/A Excoriation: No Induration: No Callus: No Crepitus: No Fluctuance: No Friable: No Rash: No Scarring: No Periwound Skin Moist: Yes No Abnormalities Noted N/A Moisture: Maceration: No Dry/Scaly: No Periwound Skin Color: Erythema: Yes No Abnormalities Noted N/A Hemosiderin Staining: Yes Atrophie Blanche: No Cyanosis: No Ecchymosis: No Mottled: No Pallor: No Rubor: No Erythema Location: Circumferential N/A N/A Temperature: No Abnormality N/A N/A Tenderness on Yes No N/A Palpation: Wound Preparation: Ulcer Cleansing: Ulcer Cleansing: Not N/A Rinsed/Irrigated with Cleansed Saline Topical Anesthetic Topical Anesthetic Applied: None Applied: Other: Lidocaine 4% Ointment Treatment Notes Electronic Signature(s) [name], [name] ([id number]) Signed: 02/12/2015 5:03:50 PM By: Gretta Cool, RN, BSN, Kim RN, BSN Entered By: Gretta Cool, RN, BSN, Kim on 02/12/2015 15:44:16 [name], [name] ([id number]) -------------------------------------------------------------------------------- Multi-Disciplinary Care Plan Details Patient Name: CARTRELL, BENTSEN. Date of Service: 02/12/2015 3:15 PM Medical Record Number: [id number] Patient Account Number: [id number] Date of Birth/Sex: [birth year] (81 y.o. Male) Treating RN: [name] Primary Care Physician: Lelon Huh Other Clinician: Referring Physician: Lelon Huh Treating Physician/Extender: BURNS, Charlean Sanfilippo in Treatment: 2 Active Inactive Abuse / Safety / Falls / Self Care Management Nursing Diagnoses: Impaired home maintenance Impaired physical mobility Knowledge deficit related to: safety; personal, health (wound), emergency Potential for falls Self care deficit: actual or potential Goals: Patient will remain injury free Date Initiated: 01/29/2015 Goal Status: Active Patient/caregiver will verbalize  understanding of skin care regimen Date Initiated: 01/29/2015 Goal Status: Active Patient/caregiver will verbalize/demonstrate measure taken to improve self care Date Initiated: 01/29/2015 Goal Status: Active Patient/caregiver will verbalize/demonstrate measures taken to improve the patient's personal safety Date Initiated: 01/29/2015 Goal Status: Active Patient/caregiver will verbalize/demonstrate measures taken to prevent injury and/or falls Date Initiated: 01/29/2015 Goal Status: Active Patient/caregiver will verbalize/demonstrate understanding of what to do in case of emergency Date Initiated: 01/29/2015 Goal Status: Active Interventions: Assess: immobility, friction, shearing, incontinence upon admission and as needed Assess impairment of mobility on admission and as needed per policy Assess self care needs on admission and as needed Patient referred to community resources (specify in notes) Provide education on basic hygiene BOUBACAR, LERETTE ([id number]) Provide education on fall prevention Provide education on personal and home safety Provide education on safe transfers Treatment Activities: Education provided on Basic Hygiene : 01/29/2015 Notes: Orientation to the Wound Care Program Nursing Diagnoses: Knowledge deficit related to the wound healing center program Goals: Patient/caregiver will verbalize understanding of the Wound Healing  Center Program Date Initiated: 01/29/2015 Goal Status: Active Interventions: Provide education on orientation to the wound center Notes: Venous Leg Ulcer Nursing Diagnoses: Knowledge deficit related to disease process and management Potential for venous Insuffiency (use before diagnosis confirmed) Goals: Patient will maintain optimal edema control Date Initiated: 01/29/2015 Goal Status: Active Patient/caregiver will verbalize understanding of disease process and disease management Date Initiated: 01/29/2015 Goal Status: Active Verify  adequate tissue perfusion prior to therapeutic compression application Date Initiated: 01/29/2015 Goal Status: Active Interventions: Assess peripheral edema status every visit. Compression as ordered Provide education on venous insufficiency Notes: GAL, FELDHAUS ([id number]) Wound/Skin Impairment Nursing Diagnoses: Impaired tissue integrity Knowledge deficit related to smoking impact on wound healing Knowledge deficit related to ulceration/compromised skin integrity Goals: Patient/caregiver will verbalize understanding of skin care regimen Date Initiated: 01/29/2015 Goal Status: Active Ulcer/skin breakdown will have a volume reduction of 30% by week 4 Date Initiated: 01/29/2015 Goal Status: Active Ulcer/skin breakdown will have a volume reduction of 50% by week 8 Date Initiated: 01/29/2015 Goal Status: Active Ulcer/skin breakdown will have a volume reduction of 80% by week 12 Date Initiated: 01/29/2015 Goal Status: Active Ulcer/skin breakdown will heal within 14 weeks Date Initiated: 01/29/2015 Goal Status: Active Interventions: Assess patient/caregiver ability to obtain necessary supplies Assess patient/caregiver ability to perform ulcer/skin care regimen upon admission and as needed Assess ulceration(s) every visit Provide education on ulcer and skin care Notes: Electronic Signature(s) Signed: 02/12/2015 5:03:50 PM By: Gretta Cool, RN, BSN, Kim RN, BSN Entered By: Gretta Cool, RN, BSN, Kim on 02/12/2015 15:44:08 [name], [name] ([id number]) -------------------------------------------------------------------------------- Pain Assessment Details Patient Name: [name]. Date of Service: 02/12/2015 3:15 PM Medical Record Number: [id number] Patient Account Number: [id number] Date of Birth/Sex: 1925-01-19 (81 y.o. Male) Treating RN: [name] Primary Care Physician: Lelon Huh Other Clinician: Referring Physician: Lelon Huh Treating Physician/Extender: BURNS,  Charlean Sanfilippo in Treatment: 2 Active Problems Location of Pain Severity and Description of Pain Patient Has Paino No Site Locations Pain Management and Medication Current Pain Management: Electronic Signature(s) Signed: 02/12/2015 5:03:50 PM By: Gretta Cool, RN, BSN, Kim RN, BSN Entered By: Gretta Cool, RN, BSN, Kim on 02/12/2015 15:19:21 Liston, [name] ([id number]) -------------------------------------------------------------------------------- Patient/Caregiver Education Details Patient Name: [name]. Date of Service: 02/12/2015 3:15 PM Medical Record Number: [id number] Patient Account Number: [id number] Date of Birth/Gender: [birth year] (81 y.o. Male) Treating RN: [name] Primary Care Physician: Lelon Huh Other Clinician: Referring Physician: Lelon Huh Treating Physician/Extender: BURNS, Charlean Sanfilippo in Treatment: 2 Education Assessment Education Provided To: Patient Education Topics Provided Wound/Skin Impairment: Handouts: Caring for Your Ulcer, Skin Care Do's and Dont's Methods: Demonstration, Explain/Verbal Responses: State content correctly Electronic Signature(s) Signed: 02/12/2015 5:03:50 PM By: Gretta Cool, RN, BSN, Kim RN, BSN Entered By: Gretta Cool, RN, BSN, Kim on 02/12/2015 16:00:14 [name], [name] ([id number]) -------------------------------------------------------------------------------- Wound Assessment Details Patient Name: [name]. Date of Service: 02/12/2015 3:15 PM Medical Record Number: [id number] Patient Account Number: [id number] Date of Birth/Sex: March 04, 1925 (81 y.o. Male) Treating RN: [name] Primary Care Physician: Lelon Huh Other Clinician: Referring Physician: Lelon Huh Treating Physician/Extender: BURNS, Charlean Sanfilippo in Treatment: 2 Wound Status Wound Number: 1 Primary Diabetic Wound/Ulcer of the Lower Etiology: Extremity Wound Location: Right Lower Leg - Medial Wound Open Wounding Event: Blister Status: Date Acquired:  12/30/2014 Comorbid Cataracts, Sleep Apnea, Arrhythmia, Weeks Of Treatment: 2 History: Congestive Heart Failure, Hypertension, Clustered Wound: No Peripheral Arterial Disease, Type II Diabetes, Gout, Osteoarthritis, Neuropathy Photos Photo Uploaded By: Gretta Cool RN, BSN, Kim on 02/12/2015 15:35:45 Wound Measurements  Length: (cm) 1.4 Width: (cm) 3.5 Depth: (cm) 0.1 Area: (cm) 3.848 Volume: (cm) 0.385 % Reduction in Area: -27.2% % Reduction in Volume: -27.5% Epithelialization: Small (1-33%) Tunneling: No Undermining: No Wound Description Classification: Grade 1 Wound Margin: Distinct, outline attached Exudate Amount: Small Exudate Type: Serosanguineous Exudate Color: red, brown Foul Odor After Cleansing: No Wound Bed Granulation Amount: Small (1-33%) Exposed Structure Granulation Quality: Red Fascia Exposed: No Kamps, Labarron H. ([id number]) Necrotic Amount: Large (67-100%) Fat Layer Exposed: No Necrotic Quality: Adherent Slough Tendon Exposed: No Muscle Exposed: No Joint Exposed: No Bone Exposed: No Limited to Skin Breakdown Periwound Skin Texture Texture Color No Abnormalities Noted: No No Abnormalities Noted: No Callus: No Atrophie Blanche: No Crepitus: No Cyanosis: No Excoriation: No Ecchymosis: No Fluctuance: No Erythema: Yes Friable: No Erythema Location: Circumferential Induration: No Hemosiderin Staining: Yes Localized Edema: No Mottled: No Rash: No Pallor: No Scarring: No Rubor: No Moisture Temperature / Pain No Abnormalities Noted: No Temperature: No Abnormality Dry / Scaly: No Tenderness on Palpation: Yes Maceration: No Moist: Yes Wound Preparation Ulcer Cleansing: Rinsed/Irrigated with Saline Topical Anesthetic Applied: Other: Lidocaine 4% Ointment , Treatment Notes Wound #1 (Right, Medial Lower Leg) 1. Cleansed with: Clean wound with Normal Saline 2. Anesthetic Topical Lidocaine 4% cream to wound bed prior to debridement 4.  Dressing Applied: Prisma Ag 5. Secondary Dressing Applied ABD and Kerlix/Conform 7. Secured with Recruitment consultant) Signed: 02/12/2015 5:03:50 PM By: Gretta Cool, RN, BSN, Kim RN, BSN Entered By: Gretta Cool, RN, BSN, Kim on 02/12/2015 15:29:54 Schneller, TAESHAWN HELFMAN ([id number], MUZQUIZ ([id number]) -------------------------------------------------------------------------------- Wound Assessment Details Patient Name: ELIZER, BOSTIC. Date of Service: 02/12/2015 3:15 PM Medical Record Number: [id number] Patient Account Number: [id number] Date of Birth/Sex: October 19, 1924 (81 y.o. Male) Treating RN: [name] Primary Care Physician: Lelon Huh Other Clinician: Referring Physician: Lelon Huh Treating Physician/Extender: BURNS, Charlean Sanfilippo in Treatment: 2 Wound Status Wound Number: 2 Primary Diabetic Wound/Ulcer of the Lower Etiology: Extremity Wound Location: Right Lower Leg - Medial Wound Open Wounding Event: Blister Status: Date Acquired: 02/12/2015 Comorbid Cataracts, Sleep Apnea, Arrhythmia, Weeks Of Treatment: 0 History: Congestive Heart Failure, Hypertension, Clustered Wound: No Peripheral Arterial Disease, Type II Diabetes, Gout, Osteoarthritis, Neuropathy Photos Photo Uploaded By: Gretta Cool, RN, BSN, Kim on 02/12/2015 15:35:45 Wound Measurements Length: (cm) 0.8 Width: (cm) 0.6 Depth: (cm) 0.1 Area: (cm) 0.377 Volume: (cm) 0.038 % Reduction in Area: 0% % Reduction in Volume: 0% Wound Description Classification: Grade 0 Exudate Amount: None Present Wound Bed Granulation Amount: Large (67-100%) Exposed Structure Necrotic Amount: None Present (0%) Fascia Exposed: No Fat Layer Exposed: No Tendon Exposed: No Muscle Exposed: No Nissan, Lindley H. ([id number]) Joint Exposed: No Bone Exposed: No Limited to Skin Breakdown Periwound Skin Texture Texture Color No Abnormalities Noted: No No Abnormalities Noted: No Moisture No Abnormalities Noted:  No Wound Preparation Ulcer Cleansing: Not Cleansed Topical Anesthetic Applied: None Treatment Notes Wound #2 (Right, Medial Lower Leg) 1. Cleansed with: Clean wound with Normal Saline 2. Anesthetic Topical Lidocaine 4% cream to wound bed prior to debridement 4. Dressing Applied: Prisma Ag 5. Secondary Dressing Applied ABD and Kerlix/Conform 7. Secured with Recruitment consultant) Signed: 02/12/2015 5:03:50 PM By: Gretta Cool, RN, BSN, Kim RN, BSN Entered By: Gretta Cool, RN, BSN, Kim on 02/12/2015 15:31:57 Andreason, [name] ([id number]) -------------------------------------------------------------------------------- Vitals Details Patient Name: [name]. Date of Service: 02/12/2015 3:15 PM Medical Record Number: [id number] Patient Account Number: [id number] Date of Birth/Sex: 15-May-1925 (81 y.o. Male) Treating RN: [name] Primary Care Physician: Caryn Section,  DONALD Other Clinician: Referring Physician: Lelon Huh Treating Physician/Extender: BURNS, Charlean Sanfilippo in Treatment: 2 Vital Signs Time Taken: 15:19 Temperature (F): 98.0 Height (in): 72 Pulse (bpm): 68 Weight (lbs): 202.1 Respiratory Rate (breaths/min): 20 Body Mass Index (BMI): 27.4 Blood Pressure (mmHg): 135/66 Reference Range: 80 - 120 mg / dl Electronic Signature(s) Signed: 02/12/2015 5:03:50 PM By: Gretta Cool, RN, BSN, Kim RN, BSN Entered By: Gretta Cool, RN, BSN, Kim on 02/12/2015 15:22:43

## 2015-02-13 NOTE — Progress Notes (Signed)
REES, Bridges ([id number]) Visit Report for 02/12/2015 Chief Complaint Document Details Patient Name: [name], [name]. Date of Service: 02/12/2015 3:15 PM Medical Record Number: [id number] Patient Account Number: [id number] Date of Birth/Sex: [birth year] (81 y.o. Male) Treating RN: Primary Care Physician: Lelon Huh Other Clinician: Referring Physician: Lelon Huh Treating Physician/Extender: BURNS, Charlean Sanfilippo in Treatment: 2 Information Obtained from: Patient Chief Complaint Right calf ulcer. Arterial insufficiency. Electronic Signature(s) Signed: 02/12/2015 4:26:44 PM By: Loletha Grayer MD Entered By: Loletha Grayer on 02/12/2015 16:08:29 [name], [name] ([id number]) -------------------------------------------------------------------------------- Debridement Details Patient Name: [name]. Date of Service: 02/12/2015 3:15 PM Medical Record Number: [id number] Patient Account Number: [id number] Date of Birth/Sex: 1925-04-14 (81 y.o. Male) Treating RN: Primary Care Physician: Lelon Huh Other Clinician: Referring Physician: Lelon Huh Treating Physician/Extender: BURNS, Charlean Sanfilippo in Treatment: 2 Debridement Performed for Wound #1 Right,Medial Lower Leg Assessment: Performed By: Physician BURNS, Teressa Senter., MD Debridement: Open Wound/Selective Debridement Selective Description: Pre-procedure Yes Verification/Time Out Taken: Start Time: 15:35 Pain Control: Other : lidocaine 4% Level: Non-Viable Tissue Total Area Debrided (L x 1.4 (cm) x 3.5 (cm) = 4.9 (cm) W): Tissue and other Non-Viable, Exudate, Fibrin/Slough, Subcutaneous material debrided: Instrument: Curette Bleeding: Minimum Hemostasis Achieved: Pressure End Time: 15:47 Procedural Pain: 0 Post Procedural Pain: 0 Response to Treatment: Procedure was tolerated well Post Debridement Measurements of Total Wound Length: (cm) 1.4 Width: (cm) 3.5 Depth: (cm) 0.2 Volume: (cm)  0.77 Electronic Signature(s) Signed: 02/12/2015 4:26:44 PM By: Loletha Grayer MD Entered By: Loletha Grayer on 02/12/2015 [name] ([id number]) -------------------------------------------------------------------------------- HPI Details Patient Name: [name]. Date of Service: 02/12/2015 3:15 PM Medical Record Number: [id number] Patient Account Number: [id number] Date of Birth/Sex: 05/17/1925 (81 y.o. Male) Treating RN: Primary Care Physician: Lelon Huh Other Clinician: Referring Physician: Lelon Huh Treating Physician/Extender: BURNS, Charlean Sanfilippo in Treatment: 2 History of Present Illness HPI Description: Pleasant 81 year old with history of diabetes (hemoglobin A1c 8.6 in April 2016), peripheral neuropathy, peripheral vascular disease (status post bilateral lower extremity stents per patient), atrial fibrillation, and chronic kidney disease. He developed a blister on his right medial calf in April 2016 and a subsequent ulceration. No significant improvement with Silvadene. He reports mild pain with pressure. No claudication or rest pain. Ambulating normally per his baseline. Unable to obtain an ABI. Has seen Dr. Lucky Cowboy in the past and is scheduled to see him again June 10. Culture grew Candida. No antibiotics. He returns to clinic for follow-up and complains of a new blister on his right medial calf, distal to his present ulceration. Denies fever or chills. No significant drainage. Electronic Signature(s) Signed: 02/12/2015 4:26:44 PM By: Loletha Grayer MD Entered By: Loletha Grayer on 02/12/2015 16:10:59 [name] ([id number]) -------------------------------------------------------------------------------- Physical Exam Details Patient Name: [name]. Date of Service: 02/12/2015 3:15 PM Medical Record Number: [id number] Patient Account Number: [id number] Date of Birth/Sex: 03/11/1925 (81 y.o. Male) Treating RN: Primary  Care Physician: Lelon Huh Other Clinician: Referring Physician: Lelon Huh Treating Physician/Extender: BURNS, Charlean Sanfilippo in Treatment: 2 Constitutional . Pulse regular. Respirations normal and unlabored. Afebrile. . Notes Right medial calf ulceration. Full-thickness. Debrided to healthy fat. Mild surrounding erythema. Tender. 1+ pitting edema, localized. No palpable pedal pulses. No dopplerable DP. Faintly dopplerable, monophasic PT. No toe signal. Foot cool to touch. Capillary refill greater than 1 second. 1cm blister on R medial calf prepped with betadine and drained with 15 blade. Electronic Signature(s) Signed: 02/12/2015 4:26:44 PM By: Kathreen Cosier,  Thayer Jew MD Entered By: Loletha Grayer on 02/12/2015 16:12:24 [name], [name] ([id number]) -------------------------------------------------------------------------------- Physician Orders Details Patient Name: [name]. Date of Service: 02/12/2015 3:15 PM Medical Record Number: [id number] Patient Account Number: [id number] Date of Birth/Sex: [birth year] (81 y.o. Male) Treating RN: [name] Primary Care Physician: Lelon Huh Other Clinician: Referring Physician: Lelon Huh Treating Physician/Extender: BURNS, Charlean Sanfilippo in Treatment: 2 Verbal / Phone Orders: Yes Clinician: [name] Read Back and Verified: Yes Diagnosis Coding Wound Cleansing Wound #1 Right,Medial Lower Leg o Cleanse wound with mild soap and water o May Shower, gently pat wound dry prior to applying new dressing. o May shower with protection. Wound #2 Right,Medial Lower Leg o Cleanse wound with mild soap and water o May Shower, gently pat wound dry prior to applying new dressing. o May shower with protection. Anesthetic Wound #1 Right,Medial Lower Leg o Topical Lidocaine 4% cream applied to wound bed prior to debridement Primary Wound Dressing Wound #1 Right,Medial Lower Leg o Prisma Ag Secondary  Dressing Wound #1 Right,Medial Lower Leg o ABD and Kerlix/Conform Wound #2 Right,Medial Lower Leg o ABD and Kerlix/Conform Dressing Change Frequency Wound #1 Right,Medial Lower Leg o Change dressing every other day. Wound #2 Right,Medial Lower Leg o Change dressing every other day. Follow-up Appointments Wound #1 Right,Medial Lower Leg o Return Appointment in 1 week. [name], [name] ([id number]) Edema Control Wound #1 Right,Medial Lower Leg o Tubigrip Wound #2 Right,Medial Lower Leg o Tubigrip Additional Orders / Instructions Wound #1 Right,Medial Lower Leg o Increase protein intake. o Activity as tolerated Wound #2 Right,Medial Lower Leg o Increase protein intake. o Activity as tolerated Notes Order Juxtalite 51cm to Knee; [facility] appointment 02/21/2015 Electronic Signature(s) Signed: 02/12/2015 4:26:44 PM By: Loletha Grayer MD Signed: 02/12/2015 5:03:50 PM By: Gretta Cool RN, BSN, Kim RN, BSN Entered By: Gretta Cool, RN, BSN, Kim on 02/12/2015 15:58:38 Lagerquist, Clemetine Bridges ([id number]) -------------------------------------------------------------------------------- Problem List Details Patient Name: RAGAN, REALE. Date of Service: 02/12/2015 3:15 PM Medical Record Number: [id number] Patient Account Number: [id number] Date of Birth/Sex: August 11, 1925 (81 y.o. 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 Date of Service: 02/12/2015 3:15 PM Medical Record Number: [id number] Patient Account Number: [id number] Date of Birth/Sex: 1925-04-01 (81 y.o. Male) Treating RN: Primary Care Physician: Lelon Huh Other Clinician: Referring Physician: Lelon Huh Treating Physician/Extender: BURNS, Charlean Sanfilippo in Treatment: 2 Subjective Chief Complaint Information obtained from Patient Right calf ulcer. Arterial insufficiency. History of Present Illness (HPI) Pleasant 81 year old with history of diabetes (hemoglobin A1c 8.6 in April 2016), peripheral neuropathy, peripheral vascular disease (status post bilateral lower extremity stents per patient), atrial fibrillation, and chronic kidney disease. He developed a blister on his right medial calf in April 2016 and a subsequent ulceration. No significant improvement with Silvadene. He  reports mild pain with pressure. No claudication or rest pain. Ambulating normally per his baseline. Unable to obtain an ABI. Has seen Dr. Lucky Cowboy in the past and is scheduled to see him again June 10. Culture grew Candida. Completed doxycycline. He returns to clinic for follow-up and complains of a new blister on his right medial calf, distal to his present ulceration. Denies fever or chills. No significant drainage. Objective Constitutional Pulse regular. Respirations normal and unlabored. Afebrile. Vitals Time Taken: 3:19 PM, Height: 72 in, Weight: 202.1 lbs, BMI: 27.4, Temperature: 98.0 F, Pulse: 68 bpm, Respiratory Rate: 20 breaths/min,  Blood Pressure: 135/66 mmHg. General Notes: Right medial calf ulceration. Full-thickness. Debrided to healthy fat. Mild surrounding erythema. Tender. 1+ pitting edema, localized. No palpable pedal pulses. No dopplerable DP. Faintly dopplerable, monophasic PT. No toe signal. Foot cool to touch. Capillary refill greater than 1 second. 1cm blister on R medial calf prepped with betadine and drained with 15 blade. Integumentary (Hair, Skin) Wound #1 status is Open. Original cause of wound was Blister. The wound is located on the Ben[place], Magnan. ([id number]) Lower Leg. The wound measures 1.4cm length x 3.5cm width x 0.1cm depth; 3.848cm^2 area and 0.385cm^3 volume. The wound is limited to skin breakdown. There is no tunneling or undermining noted. There is a small amount of serosanguineous drainage noted. The wound margin is distinct with the outline attached to the wound base. There is small (1-33%) red granulation within the wound bed. There is a large (67-100%) amount of necrotic tissue within the wound bed including Adherent Slough. The periwound skin appearance exhibited: Moist, Hemosiderin Staining, Erythema. The periwound skin appearance did not exhibit: Callus, Crepitus, Excoriation, Fluctuance, Friable, Induration, Localized Edema, Rash, Scarring, Dry/Scaly, Maceration, Atrophie Blanche, Cyanosis, Ecchymosis, Mottled, Pallor, Rubor. The surrounding wound skin color is noted with erythema which is circumferential. Periwound temperature was noted as No Abnormality. The periwound has tenderness on palpation. Wound #2 status is Open. Original cause of wound was Blister. The wound is located on the Right,Medial Lower Leg. The wound measures 0.8cm length x 0.6cm width x 0.1cm depth; 0.377cm^2 area and 0.038cm^3 volume. The wound is limited to skin breakdown. There is a none present amount of drainage noted. There is large (67-100%) granulation within the wound bed. There is no  necrotic tissue within the wound bed. 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 Arterial insufficiency. New R calf blister. Procedures Wound #1 Wound #1 is a Diabetic Wound/Ulcer of the Lower Extremity located on the Right,Medial Lower Leg . There was a Non-Viable Tissue Open Wound/Selective [phone]) debridement with total area of 4.9 sq cm performed by BURNS, Teressa Senter., MD. with the following instrument(s): Curette to remove Non-Viable tissue/material including Exudate, Fibrin/Slough, and Subcutaneous after achieving pain control using Other (lidocaine 4%). A time out was conducted prior to the start of the procedure. A Minimum amount of bleeding was controlled with Pressure. The procedure was tolerated well with a pain level of 0 throughout and a pain level of 0 following the procedure. Post Debridement Measurements: 1.4cm length x 3.5cm width x 0.2cm [name], [name] ([id number]) depth; 0.77cm^3 volume. Plan Wound Cleansing: Wound #1 Right,Medial  Lower Leg: Cleanse wound with mild soap and water May Shower, gently pat wound dry prior to applying new dressing. May shower with protection. Wound #2 Right,Medial Lower Leg: Cleanse wound with mild soap and water May Shower, gently pat wound dry prior to applying new dressing. May shower with protection. Anesthetic: Wound #1 Right,Medial Lower Leg: Topical Lidocaine 4% cream applied to wound bed prior to debridement Primary Wound Dressing: Wound #1 Right,Medial Lower Leg: Prisma Ag Secondary Dressing: Wound #1 Right,Medial Lower Leg: ABD and  Kerlix/Conform Wound #2 Right,Medial Lower Leg: ABD and Kerlix/Conform Dressing Change Frequency: Wound #1 Right,Medial Lower Leg: Change dressing every other day. Wound #2 Right,Medial Lower Leg: Change dressing every other day. Follow-up Appointments: Wound #1 Right,Medial Lower Leg: Return Appointment in 1 week. Edema Control: Wound #1 Right,Medial Lower Leg: Tubigrip Wound #2 Right,Medial Lower Leg: Tubigrip Additional Orders / Instructions: Wound #1 Right,Medial Lower Leg: Increase protein intake. Activity as tolerated Wound #2 Right,Medial Lower Leg: Increase protein intake. Activity as tolerated General Notes: Order Juxtalite 51cm to Knee; [facility] appointment 02/21/2015 [place], [place]. ([id number]) Prisma. Edema control with Tubigrip and compression sock. Vascular surgery consultation scheduled for June 10. Electronic Signature(s) Signed: 02/12/2015 4:26:44 PM By: Loletha Grayer MD Entered By: Loletha Grayer on 02/12/2015 16:14:39 Dowe, Clemetine Bridges ([id number]) -------------------------------------------------------------------------------- SuperBill Details Patient Name: [name]. Date of Service: 02/12/2015 Medical Record Number: [id number] Patient Account Number: [id number] Date of Birth/Sex: 04/13/1925 (81 y.o. 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 E11.622 Type 2 diabetes mellitus with other skin ulcer E11.43 Type 2 diabetes mellitus with diabetic autonomic (poly)neuropathy E11.22 Type 2 diabetes mellitus with diabetic chronic kidney disease I48.2 Chronic atrial fibrillation L03.115 Cellulitis of right lower limb S80.821A Blister (nonthermal), right lower leg, initial  encounter Facility Procedures CPT4: Description Modifier Quantity Code [phone] [id number] - DEBRIDE WOUND 1ST 20 SQ CM OR < 1 ICD-10 Description Diagnosis 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

## 2015-02-17 ENCOUNTER — Encounter: Payer: Medicare Other | Attending: Surgery | Admitting: Surgery

## 2015-02-17 DIAGNOSIS — E11622 Type 2 diabetes mellitus with other skin ulcer: Secondary | ICD-10-CM | POA: Insufficient documentation

## 2015-02-17 DIAGNOSIS — I482 Chronic atrial fibrillation: Secondary | ICD-10-CM | POA: Insufficient documentation

## 2015-02-17 DIAGNOSIS — I70632 Atherosclerosis of nonbiological bypass graft(s) of the right leg with ulceration of calf: Secondary | ICD-10-CM | POA: Diagnosis not present

## 2015-02-17 DIAGNOSIS — L03115 Cellulitis of right lower limb: Secondary | ICD-10-CM | POA: Diagnosis not present

## 2015-02-17 DIAGNOSIS — L97219 Non-pressure chronic ulcer of right calf with unspecified severity: Secondary | ICD-10-CM | POA: Diagnosis not present

## 2015-02-17 DIAGNOSIS — E1143 Type 2 diabetes mellitus with diabetic autonomic (poly)neuropathy: Secondary | ICD-10-CM | POA: Diagnosis not present

## 2015-02-17 DIAGNOSIS — E1122 Type 2 diabetes mellitus with diabetic chronic kidney disease: Secondary | ICD-10-CM | POA: Diagnosis not present

## 2015-02-18 ENCOUNTER — Ambulatory Visit: Payer: Medicare Other | Admitting: Surgery

## 2015-02-18 NOTE — Progress Notes (Signed)
[name] ([id number]) Visit Report for 02/17/2015 Arrival Information Details Patient Name: [name]. Date of Service: 02/17/2015 1:00 PM Medical Record Number: [id number] Patient Account Number: [id number] Date of Birth/Sex: 1925-02-26 (81 y.o. Male) Treating RN: [name] Primary Care Physician: Lelon Huh Other Clinician: Referring Physician: Lelon Huh Treating Physician/Extender: Frann Rider in Treatment: 2 Visit Information History Since Last Visit Added or deleted any medications: No Patient Arrived: Ambulatory Any new allergies or adverse reactions: No Arrival Time: 13:12 Had a fall or experienced change in No Accompanied By: self activities of daily living that may affect Transfer Assistance: None risk of falls: Patient Identification Verified: Yes Signs or symptoms of abuse/neglect since last No Secondary Verification Process Yes visito Completed: Hospitalized since last visit: No Patient Has Alerts: Yes Has Dressing in Place as Prescribed: Yes Patient Alerts: Patient on Blood Pain Present Now: No Thinner DMII Plavix and ASA 5/16 ABI (L/R)- inaudible Electronic Signature(s) Signed: 02/17/2015 5:16:59 PM By: Gretta Cool, RN, BSN, Kim RN, BSN Entered By: Gretta Cool, RN, BSN, Kim on 02/17/2015 13:12:43 [name] ([id number]) -------------------------------------------------------------------------------- Encounter Discharge Information Details Patient Name: [name]. Date of Service: 02/17/2015 1:00 PM Medical Record Number: [id number] Patient Account Number: [id number] Date of Birth/Sex: 1925/02/26 (81 y.o. Male) Treating RN: Primary Care Physician: Lelon Huh Other Clinician: Referring Physician: Lelon Huh Treating Physician/Extender: Frann Rider in Treatment: 2 Encounter Discharge Information Items Schedule Follow-up Appointment: No Medication Reconciliation completed No and provided to Patient/Care  Garmon Dehn: Provided on Clinical Summary of Care: 02/17/2015 Form Type Recipient Paper Patient HR Electronic Signature(s) Signed: 02/17/2015 1:40:51 PM By: Sharon Mt Entered By: Sharon Mt on 02/17/2015 13:40:51 [name] ([id number]) -------------------------------------------------------------------------------- Lower Extremity Assessment Details Patient Name: [name]. Date of Service: 02/17/2015 1:00 PM Medical Record Number: [id number] Patient Account Number: [id number] Date of Birth/Sex: 06-29-1925 (81 y.o. Male) Treating RN: [name] Primary Care Physician: Lelon Huh Other Clinician: Referring Physician: Lelon Huh Treating Physician/Extender: Frann Rider in Treatment: 2 Edema Assessment Assessed: [Left: No] [Right: No] Edema: [Left: Ye] [Right: s] Calf Left: Right: Point of Measurement: 35 cm From Medial Instep cm 34.8 cm Ankle Left: Right: Point of Measurement: 12 cm From Medial Instep cm 22.7 cm Vascular Assessment Pulses: Posterior Tibial Dorsalis Pedis Palpable: [Right:No] Doppler: [Right:Inaudible] Extremity colors, hair growth, and conditions: Extremity Color: [Right:Hyperpigmented] Hair Growth on Extremity: [Right:No] Temperature of Extremity: [Right:Warm] Capillary Refill: [Right:< 3 seconds] Toe Nail Assessment Left: Right: Thick: No Discolored: No Deformed: No Improper Length and Hygiene: No Electronic Signature(s) Signed: 02/17/2015 5:16:59 PM By: Gretta Cool, RN, BSN, Kim RN, BSN Entered By: Gretta Cool, RN, BSN, Kim on 02/17/2015 13:26:10 [name] ([id number]) [name] ([id number]) -------------------------------------------------------------------------------- Multi Wound Chart Details Patient Name: [name]. Date of Service: 02/17/2015 1:00 PM Medical Record Number: [id number] Patient Account Number: [id number] Date of Birth/Sex: Jan 18, 1925 (81 y.o. Male) Treating RN: Montey Hora Primary Care  Physician: Lelon Huh Other Clinician: Referring Physician: Lelon Huh Treating Physician/Extender: Frann Rider in Treatment: 2 Vital Signs Height(in): 72 Pulse(bpm): 72 Weight(lbs): 202.1 Blood Pressure 110/68 (mmHg): Body Mass Index(BMI): 27 Temperature(F): 98.2 Respiratory Rate 20 (breaths/min): Photos: [N/A:N/A] Wound Location: Right Lower Leg - Medial Right, Medial Lower Leg N/A Wounding Event: Blister Blister N/A Primary Etiology: Diabetic Wound/Ulcer of Diabetic Wound/Ulcer of N/A the Lower Extremity the Lower Extremity Comorbid History: Cataracts, Sleep Apnea, N/A N/A Arrhythmia, Congestive Heart Failure, Hypertension, Peripheral Arterial Disease, Type II Diabetes, Gout, Osteoarthritis, Neuropathy Date Acquired: 12/30/2014 02/12/2015  N/A Weeks of Treatment: 2 0 N/A Wound Status: Open Healed - Epithelialized N/A Measurements L x W x D 0.8x3x0.1 0x0x0 N/A (cm) Area (cm) : 1.885 0 N/A Volume (cm) : 0.188 0 N/A % Reduction in Area: 37.70% 100.00% N/A % Reduction in Volume: 37.70% 100.00% N/A Classification: Grade 1 Grade 0 N/A Exudate Amount: Small N/A N/A Exudate Type: Serosanguineous N/A N/A [name], [name] ([id number]) Exudate Color: red, brown N/A N/A Wound Margin: Distinct, outline attached N/A N/A Granulation Amount: Small (1-33%) N/A N/A Granulation Quality: Pink N/A N/A Necrotic Amount: Large (67-100%) N/A N/A Exposed Structures: Fascia: No N/A N/A Fat: No Tendon: No Muscle: No Joint: No Bone: No Limited to Skin Breakdown Epithelialization: Small (1-33%) N/A N/A Periwound Skin Texture: Edema: No No Abnormalities Noted N/A Excoriation: No Induration: No Callus: No Crepitus: No Fluctuance: No Friable: No Rash: No Scarring: No Periwound Skin Moist: Yes No Abnormalities Noted N/A Moisture: Maceration: No Dry/Scaly: No Periwound Skin Color: Erythema: Yes No Abnormalities Noted N/A Hemosiderin Staining: Yes Atrophie  Blanche: No Cyanosis: No Ecchymosis: No Mottled: No Pallor: No Rubor: No Erythema Location: Circumferential N/A N/A Temperature: No Abnormality N/A N/A Tenderness on Yes No N/A Palpation: Wound Preparation: Ulcer Cleansing: N/A N/A Rinsed/Irrigated with Saline Topical Anesthetic Applied: Other: Lidocaine 4% Ointment Treatment Notes Electronic Signature(s) [name] ([id number]) Signed: 02/17/2015 5:11:42 PM By: Montey Hora Entered By: Montey Hora on 02/17/2015 13:31:05 Daigler, [name] ([id number]) -------------------------------------------------------------------------------- Multi-Disciplinary Care Plan Details Patient Name: [name]. Date of Service: 02/17/2015 1:00 PM Medical Record Number: [id number] Patient Account Number: [id number] Date of Birth/Sex: [birth year] (81 y.o. Male) Treating RN: Montey Hora Primary Care Physician: Lelon Huh Other Clinician: Referring Physician: Lelon Huh Treating Physician/Extender: Frann Rider in Treatment: 2 Active Inactive Abuse / Safety / Falls / Self Care Management Nursing Diagnoses: Impaired home maintenance Impaired physical mobility Knowledge deficit related to: safety; personal, health (wound), emergency Potential for falls Self care deficit: actual or potential Goals: Patient will remain injury free Date Initiated: 01/29/2015 Goal Status: Active Patient/caregiver will verbalize understanding of skin care regimen Date Initiated: 01/29/2015 Goal Status: Active Patient/caregiver will verbalize/demonstrate measure taken to improve self care Date Initiated: 01/29/2015 Goal Status: Active Patient/caregiver will verbalize/demonstrate measures taken to improve the patient's personal safety Date Initiated: 01/29/2015 Goal Status: Active Patient/caregiver will verbalize/demonstrate measures taken to prevent injury and/or falls Date Initiated: 01/29/2015 Goal Status:  Active Patient/caregiver will verbalize/demonstrate understanding of what to do in case of emergency Date Initiated: 01/29/2015 Goal Status: Active Interventions: Assess: immobility, friction, shearing, incontinence upon admission and as needed Assess impairment of mobility on admission and as needed per policy Assess self care needs on admission and as needed Patient referred to community resources (specify in notes) Provide education on basic hygiene [name] ([id number]) Provide education on fall prevention Provide education on personal and home safety Provide education on safe transfers Treatment Activities: Education provided on Basic Hygiene : 01/29/2015 Notes: Orientation to the Wound Care Program Nursing Diagnoses: Knowledge deficit related to the wound healing center program Goals: Patient/caregiver will verbalize understanding of the [facility] Date Initiated: 01/29/2015 Goal Status: Active Interventions: Provide education on orientation to the wound center Notes: Venous Leg Ulcer Nursing Diagnoses: Knowledge deficit related to disease process and management Potential for venous Insuffiency (use before diagnosis confirmed) Goals: Patient will maintain optimal edema control Date Initiated: 01/29/2015 Goal Status: Active Patient/caregiver will verbalize understanding of disease process and disease management Date Initiated: 01/29/2015 Goal Status: Active  Verify adequate tissue perfusion prior to therapeutic compression application Date Initiated: 01/29/2015 Goal Status: Active Interventions: Assess peripheral edema status every visit. Compression as ordered Provide education on venous insufficiency Notes: [name] ([id number]) Wound/Skin Impairment Nursing Diagnoses: Impaired tissue integrity Knowledge deficit related to smoking impact on wound healing Knowledge deficit related to ulceration/compromised skin  integrity Goals: Patient/caregiver will verbalize understanding of skin care regimen Date Initiated: 01/29/2015 Goal Status: Active Ulcer/skin breakdown will have a volume reduction of 30% by week 4 Date Initiated: 01/29/2015 Goal Status: Active Ulcer/skin breakdown will have a volume reduction of 50% by week 8 Date Initiated: 01/29/2015 Goal Status: Active Ulcer/skin breakdown will have a volume reduction of 80% by week 12 Date Initiated: 01/29/2015 Goal Status: Active Ulcer/skin breakdown will heal within 14 weeks Date Initiated: 01/29/2015 Goal Status: Active Interventions: Assess patient/caregiver ability to obtain necessary supplies Assess patient/caregiver ability to perform ulcer/skin care regimen upon admission and as needed Assess ulceration(s) every visit Provide education on ulcer and skin care Notes: Electronic Signature(s) Signed: 02/17/2015 5:11:42 PM By: Montey Hora Entered By: Montey Hora on 02/17/2015 13:30:52 [name] ([id number]) -------------------------------------------------------------------------------- Pain Assessment Details Patient Name: [name]. Date of Service: 02/17/2015 1:00 PM Medical Record Number: [id number] Patient Account Number: [id number] Date of Birth/Sex: 09-23-1924 (81 y.o. Male) Treating RN: [name] Primary Care Physician: Lelon Huh Other Clinician: Referring Physician: Lelon Huh Treating Physician/Extender: Frann Rider in Treatment: 2 Active Problems Location of Pain Severity and Description of Pain Patient Has Paino No Site Locations Pain Management and Medication Current Pain Management: Electronic Signature(s) Signed: 02/17/2015 5:16:59 PM By: Gretta Cool, RN, BSN, Kim RN, BSN Entered By: Gretta Cool, RN, BSN, Kim on 02/17/2015 13:12:50 [name] ([id number]) -------------------------------------------------------------------------------- Patient/Caregiver Education Details Patient Name:  [name]. Date of Service: 02/17/2015 1:00 PM Medical Record Number: [id number] Patient Account Number: [id number] Date of Birth/Gender: 1925-07-15 (81 y.o. Male) Treating RN: Montey Hora Primary Care Physician: Lelon Huh Other Clinician: Referring Physician: Lelon Huh Treating Physician/Extender: Frann Rider in Treatment: 2 Education Assessment Education Provided To: Patient Education Topics Provided Wound/Skin Impairment: Handouts: Other: wound care as ordered Methods: Demonstration, Explain/Verbal Responses: State content correctly Electronic Signature(s) Signed: 02/17/2015 5:11:42 PM By: Montey Hora Entered By: Montey Hora on 02/17/2015 13:35:37 [name] ([id number]) -------------------------------------------------------------------------------- Wound Assessment Details Patient Name: [name]. Date of Service: 02/17/2015 1:00 PM Medical Record Number: [id number] Patient Account Number: [id number] Date of Birth/Sex: 31-Oct-1924 (81 y.o. Male) Treating RN: [name] Primary Care Physician: Lelon Huh Other Clinician: Referring Physician: Lelon Huh Treating Physician/Extender: Frann Rider in Treatment: 2 Wound Status Wound Number: 1 Primary Diabetic Wound/Ulcer of the Lower Etiology: Extremity Wound Location: Right Lower Leg - Medial Wound Open Wounding Event: Blister Status: Date Acquired: 12/30/2014 Comorbid Cataracts, Sleep Apnea, Arrhythmia, Weeks Of Treatment: 2 History: Congestive Heart Failure, Hypertension, Clustered Wound: No Peripheral Arterial Disease, Type II Diabetes, Gout, Osteoarthritis, Neuropathy Photos Photo Uploaded By: Gretta Cool, RN, BSN, Kim on 02/17/2015 13:27:56 Wound Measurements Length: (cm) 0.8 Width: (cm) 3 Depth: (cm) 0.1 Area: (cm) 1.885 Volume: (cm) 0.188 % Reduction in Area: 37.7% % Reduction in Volume: 37.7% Epithelialization: Small (1-33%) Tunneling:  No Undermining: No Wound Description Classification: Grade 1 Wound Margin: Distinct, outline attached Exudate Amount: Small Exudate Type: Serosanguineous Exudate Color: red, brown Foul Odor After Cleansing: No Wound Bed Granulation Amount: Small (1-33%) Exposed Structure Granulation Quality: Pink Fascia Exposed: No [name], [name] ([id number]) Necrotic Amount: Large (67-100%) Fat Layer Exposed: No Necrotic  Quality: Adherent Slough Tendon Exposed: No Muscle Exposed: No Joint Exposed: No Bone Exposed: No Limited to Skin Breakdown Periwound Skin Texture Texture Color No Abnormalities Noted: No No Abnormalities Noted: No Callus: No Atrophie Blanche: No Crepitus: No Cyanosis: No Excoriation: No Ecchymosis: No Fluctuance: No Erythema: Yes Friable: No Erythema Location: Circumferential Induration: No Hemosiderin Staining: Yes Localized Edema: No Mottled: No Rash: No Pallor: No Scarring: No Rubor: No Moisture Temperature / Pain No Abnormalities Noted: No Temperature: No Abnormality Dry / Scaly: No Tenderness on Palpation: Yes Maceration: No Moist: Yes Wound Preparation Ulcer Cleansing: Rinsed/Irrigated with Saline Topical Anesthetic Applied: Other: Lidocaine 4% Ointment , Treatment Notes Wound #1 (Right, Medial Lower Leg) 1. Cleansed with: Clean wound with Normal Saline 2. Anesthetic Topical Lidocaine 4% cream to wound bed prior to debridement 4. Dressing Applied: Prisma Ag 5. Secondary Dressing Applied Gauze and Kerlix/Conform 7. Secured with Therapist, music) Signed: 02/17/2015 5:16:59 PM By: Gretta Cool, RN, BSN, Kim RN, BSN Entered By: Gretta Cool, RN, BSN, Kim on 02/17/2015 13:21:44 Spainhower, PHOENYX PAULSEN ([id number], LEON ([id number]) -------------------------------------------------------------------------------- Wound Assessment Details Patient Name: KEIONDRE, COLEE. Date of Service: 02/17/2015 1:00 PM Medical Record Number:  [id number] Patient Account Number: [id number] Date of Birth/Sex: 12-Aug-1925 (81 y.o. Male) Treating RN: [name] Primary Care Physician: Lelon Huh Other Clinician: Referring Physician: Lelon Huh Treating Physician/Extender: Frann Rider in Treatment: 2 Wound Status Wound Number: 2 Primary Diabetic Wound/Ulcer of the Lower Etiology: Extremity Wound Location: Right, Medial Lower Leg Wound Status: Healed - Epithelialized Wounding Event: Blister Date Acquired: 02/12/2015 Weeks Of Treatment: 0 Clustered Wound: No Photos Photo Uploaded By: Gretta Cool, RN, BSN, Kim on 02/17/2015 13:27:56 Wound Measurements Length: (cm) 0 % Reduction in Width: (cm) 0 % Reduction in Depth: (cm) 0 Area: (cm) 0 Volume: (cm) 0 Area: 100% Volume: 100% Wound Description Classification: Grade 0 Periwound Skin Texture Texture Color No Abnormalities Noted: No No Abnormalities Noted: No Moisture No Abnormalities Noted: No Electronic Signature(s) Signed: 02/17/2015 5:16:59 PM By: Gretta Cool, RN, BSN, Kim RN, BSN Mulnix, Herron ([id number]) Entered By: Gretta Cool, RN, BSN, Kim on 02/17/2015 13:22:03 ANFERNEE, PESCHKE ([id number]) -------------------------------------------------------------------------------- Vitals Details Patient Name: [name]. Date of Service: 02/17/2015 1:00 PM Medical Record Number: [id number] Patient Account Number: [id number] Date of Birth/Sex: April 29, 1925 (81 y.o. Male) Treating RN: [name] Primary Care Physician: Lelon Huh Other Clinician: Referring Physician: Lelon Huh Treating Physician/Extender: Frann Rider in Treatment: 2 Vital Signs Time Taken: 13:12 Temperature (F): 98.2 Height (in): 72 Pulse (bpm): 72 Weight (lbs): 202.1 Respiratory Rate (breaths/min): 20 Body Mass Index (BMI): 27.4 Blood Pressure (mmHg): 110/68 Reference Range: 80 - 120 mg / dl Electronic Signature(s) Signed: 02/17/2015 5:16:59 PM By: Gretta Cool, RN, BSN, Kim RN,  BSN Entered By: Gretta Cool, RN, BSN, Kim on 02/17/2015 13:17:02

## 2015-02-18 NOTE — Progress Notes (Signed)
ZAYED, GRIFFIE ([id number]) Visit Report for 02/17/2015 Chief Complaint Document Details Patient Name: [name], [name]. Date of Service: 02/17/2015 1:00 PM Medical Record Number: [id number] Patient Account Number: [id number] Date of Birth/Sex: June 24, 1925 (81 y.o. Male) Treating RN: Primary Care Physician: Lelon Huh Other Clinician: Referring Physician: Lelon Huh Treating Physician/Extender: Frann Rider in Treatment: 2 Information Obtained from: Patient Chief Complaint Right calf ulcer. Arterial insufficiency. Electronic Signature(s) Signed: 02/17/2015 4:42:08 PM By: Christin Fudge MD, FACS Entered By: Christin Fudge on 02/17/2015 13:36:47 Cothran, [name] ([id number]) -------------------------------------------------------------------------------- Debridement Details Patient Name: [name]. Date of Service: 02/17/2015 1:00 PM Medical Record Number: [id number] Patient Account Number: [id number] Date of Birth/Sex: [birth year] (81 y.o. Male) Treating RN: Primary Care Physician: Lelon Huh Other Clinician: Referring Physician: Lelon Huh Treating Physician/Extender: Frann Rider in Treatment: 2 Debridement Performed for Wound #1 Right,Medial Lower Leg Assessment: Performed By: Physician Pat Patrick., MD Debridement: Open Wound/Selective Debridement Selective Description: Pre-procedure Yes Verification/Time Out Taken: Start Time: 13:31 Pain Control: Lidocaine 4% Topical Solution Level: Non-Viable Tissue Total Area Debrided (L x 0.8 (cm) x 3 (cm) = 2.4 (cm) W): Tissue and other Non-Viable, Exudate, Fibrin/Slough material debrided: Instrument: Forceps Bleeding: Minimum Hemostasis Achieved: Pressure End Time: 13:34 Procedural Pain: 0 Post Procedural Pain: 0 Response to Treatment: Procedure was tolerated well Post Debridement Measurements of Total Wound Length: (cm) 0.8 Width: (cm) 3 Depth: (cm) 0.1 Volume: (cm)  0.188 Electronic Signature(s) Signed: 02/17/2015 4:42:08 PM By: Christin Fudge MD, FACS Entered By: Christin Fudge on 02/17/2015 13:36:40 Trautman, [name] ([id number]) -------------------------------------------------------------------------------- HPI Details Patient Name: [name]. Date of Service: 02/17/2015 1:00 PM Medical Record Number: [id number] Patient Account Number: [id number] Date of Birth/Sex: Nov 19, 1924 (81 y.o. Male) Treating RN: Primary Care Physician: Lelon Huh Other Clinician: Referring Physician: Lelon Huh Treating Physician/Extender: Frann Rider in Treatment: 2 History of Present Illness HPI Description: Pleasant 81 year old with history of diabetes (hemoglobin A1c 8.6 in April 2016), peripheral neuropathy, peripheral vascular disease (status post bilateral lower extremity stents per patient), atrial fibrillation, and chronic kidney disease. He developed a blister on his right medial calf in April 2016 and a subsequent ulceration. No significant improvement with Silvadene. He reports mild pain with pressure. No claudication or rest pain. Ambulating normally per his baseline. Unable to obtain an ABI. Has seen Dr. Lucky Cowboy in the past and is scheduled to see him again June 10. Culture grew Candida. No antibiotics. He returns to clinic for follow-up and complains of a new blister on his right medial calf, distal to his present ulceration. Denies fever or chills. No significant drainage. 02/17/2015 -- was seen earlier this week as he is unavailable on Wednesday when he has to go to [facility] for a skin procedure. No new complaints. Electronic Signature(s) Signed: 02/17/2015 4:42:08 PM By: Christin Fudge MD, FACS Entered By: Christin Fudge on 02/17/2015 13:37:59 Ostlund, [name] ([id number]) -------------------------------------------------------------------------------- Physical Exam Details Patient Name: [name]. Date of Service: 02/17/2015  1:00 PM Medical Record Number: [id number] Patient Account Number: [id number] Date of Birth/Sex: 1925/04/04 (81 y.o. Male) Treating RN: Primary Care Physician: Lelon Huh Other Clinician: Referring Physician: Lelon Huh Treating Physician/Extender: Frann Rider in Treatment: 2 Constitutional . Pulse regular. Respirations normal and unlabored. Afebrile. . Eyes Nonicteric. Reactive to light. Ears, Nose, Mouth, and Throat Lips, teeth, and gums WNL.Marland Kitchen Moist mucosa without lesions . Neck supple and nontender. No palpable supraclavicular or cervical adenopathy. Normal sized without goiter. Respiratory WNL. No retractions.. Cardiovascular no palpable  ankle pulses. no edema in lower extremities. Integumentary (Hair, Skin) the ulceration has minimal slough and after trying to clean it with a saline gauze I was able to pick up some of the debris with forceps.. No crepitus or fluctuance. No peri-wound warmth or erythema. No masses.Marland Kitchen Psychiatric Judgement and insight Intact.. No evidence of depression, anxiety, or agitation.. Electronic Signature(s) Signed: 02/17/2015 4:42:08 PM By: Christin Fudge MD, FACS Entered By: Christin Fudge on 02/17/2015 13:39:22 [name], [name] ([id number]) -------------------------------------------------------------------------------- Physician Orders Details Patient Name: [name]. Date of Service: 02/17/2015 1:00 PM Medical Record Number: [id number] Patient Account Number: [id number] Date of Birth/Sex: Dec 27, 1924 (81 y.o. Male) Treating RN: Montey Hora Primary Care Physician: Lelon Huh Other Clinician: Referring Physician: Lelon Huh Treating Physician/Extender: Frann Rider in Treatment: 2 Verbal / Phone Orders: Yes Clinician: Montey Hora Read Back and Verified: Yes Diagnosis Coding Wound Cleansing Wound #1 Right,Medial Lower Leg o Cleanse wound with mild soap and water o May Shower, gently pat wound dry prior  to applying new dressing. o May shower with protection. Anesthetic Wound #1 Right,Medial Lower Leg o Topical Lidocaine 4% cream applied to wound bed prior to debridement Primary Wound Dressing Wound #1 Right,Medial Lower Leg o Prisma Ag Secondary Dressing Wound #1 Right,Medial Lower Leg o ABD and Kerlix/Conform Dressing Change Frequency Wound #1 Right,Medial Lower Leg o Change dressing every other day. Follow-up Appointments Wound #1 Right,Medial Lower Leg o Return Appointment in 1 week. Edema Control Wound #1 Right,Medial Lower Leg o Tubigrip Additional Orders / Instructions Wound #1 Right,Medial Lower Leg o Increase protein intake. o Activity as tolerated [name], [name] ([id number]) Electronic Signature(s) Signed: 02/17/2015 4:42:08 PM By: Christin Fudge MD, FACS Signed: 02/17/2015 5:11:42 PM By: Montey Hora Entered By: Montey Hora on 02/17/2015 13:35:12 [name], [name] ([id number]) -------------------------------------------------------------------------------- Problem List Details Patient Name: [name], [name] Date of Service: 02/17/2015 1:00 PM Medical Record Number: [id number] Patient Account Number: [id number] Date of Birth/Sex: 1925-05-09 (81 y.o. 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 Date of Service: 02/17/2015 1:00 PM Medical Record Number: [id number] Patient Account Number: [id number] Date of Birth/Sex: 08-11-1925 (81 y.o. Male) Treating RN: Primary Care Physician: Lelon Huh Other Clinician: Referring Physician: Lelon Huh Treating Physician/Extender: Frann Rider in Treatment: 2 Subjective Chief Complaint Information obtained from Patient Right calf ulcer. Arterial insufficiency. History of Present Illness (HPI) Pleasant 82 year old with history of diabetes (hemoglobin A1c 8.6 in April 2016), peripheral neuropathy, peripheral vascular disease (status post bilateral lower extremity stents per patient), atrial fibrillation, and chronic kidney disease. He developed a blister on his right medial calf in April 2016 and a subsequent ulceration. No significant improvement with Silvadene. He reports mild pain with pressure. No claudication or rest pain. Ambulating normally per his baseline. Unable to obtain  an ABI. Has seen Dr. Lucky Cowboy in the past and is scheduled to see him again June 10. Culture grew Candida. No antibiotics. He returns to clinic for follow-up and complains of a new blister on his right medial calf, distal to his present ulceration. Denies fever or chills. No significant drainage. 02/17/2015 -- was seen earlier this week as he is unavailable on Wednesday when he has to go to [facility] for a skin procedure. No new complaints. Objective Constitutional Pulse regular. Respirations normal and unlabored. Afebrile. Vitals Time Taken: 1:12 PM, Height: 72 in, Weight: 202.1 lbs, BMI: 27.4, Temperature: 98.2 F,  Pulse: 72 bpm, Respiratory Rate: 20 breaths/min, Blood Pressure: 110/68 mmHg. Eyes Nonicteric. Reactive to light. Ears, Nose, Mouth, and Throat Lips, teeth, and gums WNL.Marland Kitchen Moist mucosa without lesions . [name], [name] ([id number]) Neck supple and nontender. No palpable supraclavicular or cervical adenopathy. Normal sized without goiter. Respiratory WNL. No retractions.. Cardiovascular no palpable ankle pulses. no edema in lower extremities. Psychiatric Judgement and insight Intact.. No evidence of depression, anxiety, or agitation.. Integumentary (Hair, Skin) the ulceration has minimal slough and after trying to clean it with a saline gauze I was able to pick up some of the debris with forceps.. No crepitus or fluctuance. No peri-wound warmth or erythema. No masses.. Wound #1 status is Open. Original cause of wound was Blister. The wound is located on the Right,Medial Lower Leg. The wound measures 0.8cm length x 3cm width x 0.1cm depth; 1.885cm^2 area and 0.188cm^3 volume. The wound is limited to skin breakdown. There is no tunneling or undermining noted. There is a small amount of serosanguineous drainage noted. The wound margin is distinct with the outline attached to the wound base. There is small (1-33%) pink granulation within the wound bed. There is a large (67-100%) amount of necrotic tissue within the wound bed including Adherent Slough. The periwound skin appearance exhibited: Moist, Hemosiderin Staining, Erythema. The periwound skin appearance did not exhibit: Callus, Crepitus, Excoriation, Fluctuance, Friable, Induration, Localized Edema, Rash, Scarring, Dry/Scaly, Maceration, Atrophie Blanche, Cyanosis, Ecchymosis, Mottled, Pallor, Rubor. The surrounding wound skin color is noted with erythema which is circumferential. Periwound temperature was noted as No Abnormality. The periwound has tenderness on palpation. Wound #2 status is Healed - Epithelialized. Original cause of  wound was Blister. The wound is located on the Right,Medial Lower Leg. The wound measures 0cm length x 0cm width x 0cm depth; 0cm^2 area and 0cm^3 volume. 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 ([id number]) I will recommend Prisma to continue and do a dressings on every other day and see Dr. Quay Burow back on Wednesday in 10 days' time. He also has his vascular appointment this coming Friday Procedures Wound #1 Wound #1 is a Diabetic Wound/Ulcer of the Lower Extremity located on the Right,Medial Lower Leg . There was a Non-Viable Tissue Open Wound/Selective [phone]) debridement with total area of 2.4 sq cm performed by Ransome Helwig, Jackson Latino., MD. with the following instrument(s): Forceps to remove Non-Viable tissue/material including Exudate and Fibrin/Slough after achieving pain control using Lidocaine 4% Topical Solution. A time out was conducted prior to the start of the procedure. A Minimum amount of bleeding  was controlled with Pressure. The procedure was tolerated well with a pain level of 0 throughout and a pain level of 0 following the procedure. Post Debridement Measurements: 0.8cm length x 3cm width x 0.1cm depth; 0.188cm^3 volume. Plan Wound Cleansing: Wound #1 Right,Medial Lower Leg: Cleanse wound with mild soap and water May Shower, gently pat wound dry prior to applying new dressing. May shower with protection. Anesthetic: Wound #1 Right,Medial Lower Leg: Topical Lidocaine 4% cream applied to wound bed prior to debridement Primary Wound Dressing: Wound #1 Right,Medial Lower Leg: Prisma  Ag Secondary Dressing: Wound #1 Right,Medial Lower Leg: ABD and Kerlix/Conform Dressing Change Frequency: Wound #1 Right,Medial Lower Leg: Change dressing every other day. Follow-up Appointments: Wound #1 Right,Medial Lower Leg: Return Appointment in 1 week. Edema Control: Wound #1 Right,Medial Lower Leg: [name], [name] ([id number]) Tubigrip Additional Orders / Instructions: Wound #1 Right,Medial Lower Leg: Increase protein intake. Activity as tolerated I will recommend Prisma to continue and do a dressings on every other day and see Dr. Quay Burow back on Wednesday in 10 days' time. He also has his vascular appointment this coming Friday Electronic Signature(s) Signed: 02/17/2015 4:42:08 PM By: Christin Fudge MD, FACS Entered By: Christin Fudge on 02/17/2015 13:40:41 [name], [name] ([id number]) -------------------------------------------------------------------------------- SuperBill Details Patient Name: [name]. Date of Service: 02/17/2015 Medical Record Number: [id number] Patient Account Number: [id number] Date of Birth/Sex: 08-30-1925 (81 y.o. 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 E11.622 Type 2 diabetes mellitus with other skin ulcer E11.43 Type 2 diabetes mellitus with diabetic autonomic (poly)neuropathy E11.22 Type 2 diabetes mellitus with diabetic chronic kidney disease I48.2 Chronic atrial fibrillation L03.115 Cellulitis of right lower limb Facility Procedures CPT4: Description Modifier Quantity Code [id number] - DEBRIDE WOUND 1ST 20 SQ CM OR < 1 ICD-10 Description Diagnosis E11.622 Type 2 diabetes mellitus with other skin ulcer I70.632 Atherosclerosis of nonbiological bypass  graft(s) of the right leg with  ulceration of calf Physician Procedures CPT4: Description Modifier Quantity Code [phone] [id number] - WC PHYS DEBR WO ANESTH 20 SQ CM 1 ICD-10 Description Diagnosis E11.622 Type 2 diabetes mellitus with other skin ulcer I70.632 Atherosclerosis of nonbiological bypass graft(s) of the right leg with  ulceration of calf Electronic Signature(s) Signed: 02/17/2015 4:42:08 PM By: Christin Fudge MD, FACS Entered By: Christin Fudge on 02/17/2015 13:40:58

## 2015-02-26 ENCOUNTER — Encounter: Payer: Medicare Other | Admitting: Surgery

## 2015-02-26 DIAGNOSIS — L97219 Non-pressure chronic ulcer of right calf with unspecified severity: Secondary | ICD-10-CM | POA: Diagnosis not present

## 2015-02-26 NOTE — Progress Notes (Signed)
CADDEN, ELIZONDO ([id number]) Visit Report for 02/26/2015 Arrival Information Details Patient Name: [name], [name]. Date of Service: 02/26/2015 1:45 PM Medical Record Number: [id number] Patient Account Number: [id number] Date of Birth/Sex: 13-Oct-1924 (81 y.o. Male) Treating RN: Junious Dresser Primary Care Physician: Lelon Huh Other Clinician: Referring Physician: Lelon Huh Treating Physician/Extender: BURNS III, Charlean Sanfilippo in Treatment: 4 Visit Information History Since Last Visit Added or deleted any medications: No Patient Arrived: Ambulatory Any new allergies or adverse reactions: No Arrival Time: 13:48 Had a fall or experienced change in No Accompanied By: self activities of daily living that may affect Transfer Assistance: None risk of falls: Patient Identification Verified: Yes Signs or symptoms of abuse/neglect since last No Secondary Verification Process Yes visito Completed: Hospitalized since last visit: No Patient Has Alerts: Yes Has Dressing in Place as Prescribed: Yes Patient Alerts: Patient on Blood Pain Present Now: No Thinner DMII Plavix and ASA 5/16 ABI (L/R)- inaudible Electronic Signature(s) Signed: 02/26/2015 4:53:40 PM By: Junious Dresser RN Entered By: Junious Dresser on 02/26/2015 13:49:41 [name] ([id number]) -------------------------------------------------------------------------------- Encounter Discharge Information Details Patient Name: [name]. Date of Service: 02/26/2015 1:45 PM Medical Record Number: [id number] Patient Account Number: [id number] Date of Birth/Sex: May 03, 1925 (81 y.o. Male) Treating RN: Primary Care Physician: Lelon Huh Other Clinician: Referring Physician: Lelon Huh Treating Physician/Extender: BURNS III, Charlean Sanfilippo in Treatment: 4 Encounter Discharge Information Items Schedule Follow-up Appointment: No Medication Reconciliation completed No and provided to Patient/Care  Tejasvi Brissett: Provided on Clinical Summary of Care: 02/26/2015 Form Type Recipient Paper Patient HR Electronic Signature(s) Signed: 02/26/2015 2:21:40 PM By: Ruthine Dose Entered By: Ruthine Dose on 02/26/2015 14:21:40 [name] ([id number]) -------------------------------------------------------------------------------- Lower Extremity Assessment Details Patient Name: [name]. Date of Service: 02/26/2015 1:45 PM Medical Record Number: [id number] Patient Account Number: [id number] Date of Birth/Sex: 07/13/1925 (81 y.o. Male) Treating RN: Junious Dresser Primary Care Physician: Lelon Huh Other Clinician: Referring Physician: Lelon Huh Treating Physician/Extender: BURNS III, Charlean Sanfilippo in Treatment: 4 Edema Assessment Assessed: [Left: No] [Right: Yes] Edema: [Left: Ye] [Right: s] Calf Left: Right: Point of Measurement: 35 cm From Medial Instep cm 35 cm Ankle Left: Right: Point of Measurement: 12 cm From Medial Instep cm 22.4 cm Vascular Assessment Claudication: Claudication Assessment [Right:None] Pulses: Posterior Tibial Palpable: [Right:No] Dorsalis Pedis Palpable: [Right:No] Extremity colors, hair growth, and conditions: Extremity Color: [Right:Hyperpigmented] Hair Growth on Extremity: [Right:No] Temperature of Extremity: [Right:Warm] Capillary Refill: [Right:< 3 seconds] Dependent Rubor: [Right:No] Blanched when Elevated: [Right:No] Toe Nail Assessment Left: Right: Thick: Yes Discolored: Yes Deformed: No Improper Length and Hygiene: No STEFFON, GLADU ([id number]) Electronic Signature(s) Signed: 02/26/2015 4:53:40 PM By: Junious Dresser RN Entered By: Junious Dresser on 02/26/2015 13:53:18 [name] ([id number]) -------------------------------------------------------------------------------- Multi Wound Chart Details Patient Name: [name]. Date of Service: 02/26/2015 1:45 PM Medical Record Number: [id number] Patient  Account Number: [id number] Date of Birth/Sex: 1925/04/17 (81 y.o. Male) Treating RN: Baruch Gouty, RN, BSN, Velva Harman Primary Care Physician: Lelon Huh Other Clinician: Referring Physician: Lelon Huh Treating Physician/Extender: BURNS III, Bridges Weeks in Treatment: 4 Vital Signs Height(in): 72 Pulse(bpm): 72 Weight(lbs): 202.1 Blood Pressure 123/63 (mmHg): Body Mass Index(BMI): 27 Temperature(F): 97.8 Respiratory Rate 20 (breaths/min): Photos: [1:No Photos] [N/A:N/A] Wound Location: [1:Right Lower Leg - Medial N/A] Wounding Event: [1:Blister] [N/A:N/A] Primary Etiology: [1:Diabetic Wound/Ulcer of N/A the Lower Extremity] Comorbid History: [1:Cataracts, Sleep Apnea, N/A Arrhythmia, Congestive Heart Failure, Hypertension, Peripheral Arterial Disease, Type II Diabetes, Gout, Osteoarthritis, Neuropathy] Date Acquired: [1:12/30/2014] [N/A:N/A] Weeks of Treatment: [  1:4] [N/A:N/A] Wound Status: [1:Open] [N/A:N/A] Measurements L x W x D 0.9x2.7x0.1 [N/A:N/A] (cm) Area (cm) : [1:1.909] [N/A:N/A] Volume (cm) : [1:0.191] [N/A:N/A] % Reduction in Area: [1:36.90%] [N/A:N/A] % Reduction in Volume: 36.80% [N/A:N/A] Classification: [1:Grade 1] [N/A:N/A] Exudate Amount: [1:Small] [N/A:N/A] Exudate Type: [1:Serosanguineous] [N/A:N/A] Exudate Color: [1:red, brown] [N/A:N/A] Wound Margin: [1:Distinct, outline attached N/A] Granulation Amount: [1:Medium (34-66%)] [N/A:N/A] Granulation Quality: [1:Red, Pink] [N/A:N/A] Necrotic Amount: [1:Medium (34-66%)] [N/A:N/A] Exposed Structures: [N/A:N/A] Fascia: No Fat: No Tendon: No Muscle: No Joint: No Bone: No Limited to Skin Breakdown Epithelialization: Small (1-33%) N/A N/A Periwound Skin Texture: Edema: No N/A N/A Excoriation: No Induration: No Callus: No Crepitus: No Fluctuance: No Friable: No Rash: No Scarring: No Periwound Skin Moist: Yes N/A N/A Moisture: Maceration: No Dry/Scaly: No Periwound Skin Color: Erythema: Yes N/A  N/A Hemosiderin Staining: Yes Atrophie Blanche: No Cyanosis: No Ecchymosis: No Mottled: No Pallor: No Rubor: No Erythema Location: Circumferential N/A N/A Temperature: No Abnormality N/A N/A Tenderness on Yes N/A N/A Palpation: Wound Preparation: Ulcer Cleansing: N/A N/A Rinsed/Irrigated with Saline Topical Anesthetic Applied: Other: Lidocaine 4% Ointment Treatment Notes Electronic Signature(s) Signed: 02/26/2015 4:56:06 PM By: Regan Lemming BSN, RN Entered By: Regan Lemming on 02/26/2015 14:10:00 MASIN, SHATTO ([id number]) -------------------------------------------------------------------------------- Multi-Disciplinary Care Plan Details Patient Name: [name]. Date of Service: 02/26/2015 1:45 PM Medical Record Number: [id number] Patient Account Number: [id number] Date of Birth/Sex: [birth year] (81 y.o. Male) Treating RN: Baruch Gouty, RN, BSN, Velva Harman Primary Care Physician: Lelon Huh Other Clinician: Referring Physician: Lelon Huh Treating Physician/Extender: BURNS III, Charlean Sanfilippo in Treatment: 4 Active Inactive Abuse / Safety / Falls / Self Care Management Nursing Diagnoses: Impaired home maintenance Impaired physical mobility Knowledge deficit related to: safety; personal, health (wound), emergency Potential for falls Self care deficit: actual or potential Goals: Patient will remain injury free Date Initiated: 01/29/2015 Goal Status: Active Patient/caregiver will verbalize understanding of skin care regimen Date Initiated: 01/29/2015 Goal Status: Active Patient/caregiver will verbalize/demonstrate measure taken to improve self care Date Initiated: 01/29/2015 Goal Status: Active Patient/caregiver will verbalize/demonstrate measures taken to improve the patient's personal safety Date Initiated: 01/29/2015 Goal Status: Active Patient/caregiver will verbalize/demonstrate measures taken to prevent injury and/or falls Date Initiated: 01/29/2015 Goal Status:  Active Patient/caregiver will verbalize/demonstrate understanding of what to do in case of emergency Date Initiated: 01/29/2015 Goal Status: Active Interventions: Assess: immobility, friction, shearing, incontinence upon admission and as needed Assess impairment of mobility on admission and as needed per policy Assess self care needs on admission and as needed Patient referred to community resources (specify in notes) Provide education on basic hygiene JUWAUN, INSKEEP ([id number]) Provide education on fall prevention Provide education on personal and home safety Provide education on safe transfers Treatment Activities: Education provided on Basic Hygiene : 01/29/2015 Notes: Orientation to the Wound Care Program Nursing Diagnoses: Knowledge deficit related to the wound healing center program Goals: Patient/caregiver will verbalize understanding of the [place] [facility] Program Date Initiated: 01/29/2015 Goal Status: Active Interventions: Provide education on orientation to the wound center Notes: Venous Leg Ulcer Nursing Diagnoses: Knowledge deficit related to disease process and management Potential for venous Insuffiency (use before diagnosis confirmed) Goals: Patient will maintain optimal edema control Date Initiated: 01/29/2015 Goal Status: Active Patient/caregiver will verbalize understanding of disease process and disease management Date Initiated: 01/29/2015 Goal Status: Active Verify adequate tissue perfusion prior to therapeutic compression application Date Initiated: 01/29/2015 Goal Status: Active Interventions: Assess peripheral edema status every visit. Compression as ordered Provide education on venous insufficiency Notes:  JABARI, SWOVELAND ([id number]) Wound/Skin Impairment Nursing Diagnoses: Impaired tissue integrity Knowledge deficit related to smoking impact on wound healing Knowledge deficit related to ulceration/compromised skin  integrity Goals: Patient/caregiver will verbalize understanding of skin care regimen Date Initiated: 01/29/2015 Goal Status: Active Ulcer/skin breakdown will have a volume reduction of 30% by week 4 Date Initiated: 01/29/2015 Goal Status: Active Ulcer/skin breakdown will have a volume reduction of 50% by week 8 Date Initiated: 01/29/2015 Goal Status: Active Ulcer/skin breakdown will have a volume reduction of 80% by week 12 Date Initiated: 01/29/2015 Goal Status: Active Ulcer/skin breakdown will heal within 14 weeks Date Initiated: 01/29/2015 Goal Status: Active Interventions: Assess patient/caregiver ability to obtain necessary supplies Assess patient/caregiver ability to perform ulcer/skin care regimen upon admission and as needed Assess ulceration(s) every visit Provide education on ulcer and skin care Notes: Electronic Signature(s) Signed: 02/26/2015 4:56:06 PM By: Regan Lemming BSN, RN Entered By: Regan Lemming on 02/26/2015 14:09:41 Ripple, Clemetine Bridges ([id number]) -------------------------------------------------------------------------------- Pain Assessment Details Patient Name: [name]. Date of Service: 02/26/2015 1:45 PM Medical Record Number: [id number] Patient Account Number: [id number] Date of Birth/Sex: Jul 25, 1925 (81 y.o. Male) Treating RN: Junious Dresser Primary Care Physician: Lelon Huh Other Clinician: Referring Physician: Lelon Huh Treating Physician/Extender: BURNS III, Charlean Sanfilippo in Treatment: 4 Active Problems Location of Pain Severity and Description of Pain Patient Has Paino No Site Locations Pain Management and Medication Current Pain Management: Electronic Signature(s) Signed: 02/26/2015 4:53:40 PM By: Junious Dresser RN Entered By: Junious Dresser on 02/26/2015 13:51:24 Koenigs, Clemetine Bridges ([id number]) -------------------------------------------------------------------------------- Patient/Caregiver Education Details Patient Name: [name]. Date of Service: 02/26/2015 1:45 PM Medical Record Number: [id number] Patient Account Number: [id number] Date of Birth/Gender: [birth year] (81 y.o. Male) Treating RN: Junious Dresser Primary Care Physician: Lelon Huh Other Clinician: Referring Physician: Lelon Huh Treating Physician/Extender: BURNS III, Charlean Sanfilippo in Treatment: 4 Education Assessment Education Provided To: Patient Education Topics Provided Basic Hygiene: Methods: Explain/Verbal Responses: State content correctly Safety: Methods: Explain/Verbal Responses: State content correctly Venous: Methods: Explain/Verbal Responses: State content correctly Wound Debridement: Methods: Explain/Verbal Responses: State content correctly Wound/Skin Impairment: Methods: Explain/Verbal Responses: State content correctly Electronic Signature(s) Signed: 02/26/2015 4:53:40 PM By: Junious Dresser RN Entered By: Junious Dresser on 02/26/2015 14:37:49 Wandrey, Clemetine Bridges ([id number]) -------------------------------------------------------------------------------- Wound Assessment Details Patient Name: [name]. Date of Service: 02/26/2015 1:45 PM Medical Record Number: [id number] Patient Account Number: [id number] Date of Birth/Sex: 1925/07/27 (81 y.o. Male) Treating RN: Junious Dresser Primary Care Physician: Lelon Huh Other Clinician: Referring Physician: Lelon Huh Treating Physician/Extender: BURNS III, Charlean Sanfilippo in Treatment: 4 Wound Status Wound Number: 1 Primary Diabetic Wound/Ulcer of the Lower Etiology: Extremity Wound Location: Right Lower Leg - Medial Wound Open Wounding Event: Blister Status: Date Acquired: 12/30/2014 Comorbid Cataracts, Sleep Apnea, Arrhythmia, Weeks Of Treatment: 4 History: Congestive Heart Failure, Hypertension, Clustered Wound: No Peripheral Arterial Disease, Type II Diabetes, Gout, Osteoarthritis, Neuropathy Photos Photo Uploaded By: Junious Dresser on  02/26/2015 16:45:48 Wound Measurements Length: (cm) 0.9 Width: (cm) 2.7 Depth: (cm) 0.1 Area: (cm) 1.909 Volume: (cm) 0.191 % Reduction in Area: 36.9% % Reduction in Volume: 36.8% Epithelialization: Small (1-33%) Tunneling: No Undermining: No Wound Description Classification: Grade 1 Foul Odor Afte Wound Margin: Distinct, outline attached Kleinert, Zeric H. ([id number]) r Cleansing: No Exudate Amount: Small Exudate Type: Serosanguineous Exudate Color: red, brown Wound Bed Granulation Amount: Medium (34-66%) Exposed Structure Granulation Quality: Red, Pink Fascia Exposed: No Necrotic Amount: Medium (34-66%) Fat Layer Exposed: No Necrotic Quality: Adherent Slough Tendon Exposed: No Muscle  Exposed: No Joint Exposed: No Bone Exposed: No Limited to Skin Breakdown Periwound Skin Texture Texture Color No Abnormalities Noted: No No Abnormalities Noted: No Callus: No Atrophie Blanche: No Crepitus: No Cyanosis: No Excoriation: No Ecchymosis: No Fluctuance: No Erythema: Yes Friable: No Erythema Location: Circumferential Induration: No Hemosiderin Staining: Yes Localized Edema: No Mottled: No Rash: No Pallor: No Scarring: No Rubor: No Moisture Temperature / Pain No Abnormalities Noted: No Temperature: No Abnormality Dry / Scaly: No Tenderness on Palpation: Yes Maceration: No Moist: Yes Wound Preparation Ulcer Cleansing: Rinsed/Irrigated with Saline Topical Anesthetic Applied: Other: Lidocaine 4% Ointment , Treatment Notes Wound #1 (Right, Medial Lower Leg) 1. Cleansed with: Clean wound with Normal Saline 2. Anesthetic Topical Lidocaine 4% cream to wound bed prior to debridement 4. Dressing Applied: Prisma Ag 5. Secondary Dressing Applied Gauze and Kerlix/Conform Gorrell, Rontavious H. ([id number]) 7. Secured with Microbiologist) Signed: 02/26/2015 4:53:40 PM By: Junious Dresser RN Entered By: Junious Dresser on 02/26/2015  13:56:26 Milian, Clemetine Bridges ([id number]) -------------------------------------------------------------------------------- [place] Details Patient Name: [name]. Date of Service: 02/26/2015 1:45 PM Medical Record Number: [id number] Patient Account Number: [id number] Date of Birth/Sex: October 20, 1924 (81 y.o. Male) Treating RN: Junious Dresser Primary Care Physician: Lelon Huh Other Clinician: Referring Physician: Lelon Huh Treating Physician/Extender: BURNS III, Charlean Sanfilippo in Treatment: 4 Vital Signs Time Taken: 13:50 Temperature (F): 97.8 Height (in): 72 Pulse (bpm): 72 Weight (lbs): 202.1 Respiratory Rate (breaths/min): 20 Body Mass Index (BMI): 27.4 Blood Pressure (mmHg): 123/63 Reference Range: 80 - 120 mg / dl Electronic Signature(s) Signed: 02/26/2015 4:53:40 PM By: Junious Dresser RN Entered By: Junious Dresser on 02/26/2015 13:51:41

## 2015-02-26 NOTE — Progress Notes (Signed)
[name], Bridges ([id number]) Visit Report for 02/26/2015 Chief Complaint Document Details Patient Name: [name], [name]. Date of Service: 02/26/2015 1:45 PM Medical Record Number: [id number] Patient Account Number: [id number] Date of Birth/Sex: 12/11/1924 (81 y.o. Male) Treating RN: Primary Care Physician: Lelon Huh Other Clinician: Referring Physician: Lelon Huh Treating Physician/Extender: BURNS III, Charlean Sanfilippo in Treatment: 4 Information Obtained from: Patient Chief Complaint Right calf ulcer. Arterial insufficiency. Electronic Signature(s) Signed: 02/26/2015 3:28:51 PM By: Loletha Grayer MD Entered By: Loletha Grayer on 02/26/2015 14:51:34 Komorowski, [name] ([id number]) -------------------------------------------------------------------------------- Debridement Details Patient Name: [name]. Date of Service: 02/26/2015 1:45 PM Medical Record Number: [id number] Patient Account Number: [id number] Date of Birth/Sex: 06-20-1925 (81 y.o. Male) Treating RN: Primary Care Physician: Lelon Huh Other Clinician: Referring Physician: Lelon Huh Treating Physician/Extender: BURNS III, Charlean Sanfilippo in Treatment: 4 Debridement Performed for Wound #1 Right,Medial Lower Leg Assessment: Performed By: Physician BURNS III, Teressa Senter., MD Debridement: Open Wound/Selective Debridement Selective Description: Pre-procedure Yes Verification/Time Out Taken: Start Time: 14:11 Pain Control: Lidocaine 4% Topical Solution Level: Non-Viable Tissue Total Area Debrided (L x 0.9 (cm) x 2.7 (cm) = 2.43 (cm) W): Tissue and other Non-Viable, Fibrin/Slough material debrided: Instrument: Curette Bleeding: Minimum Hemostasis Achieved: Pressure End Time: 14:11 Procedural Pain: 0 Post Procedural Pain: 0 Response to Treatment: Procedure was tolerated well Post Debridement Measurements of Total Wound Length: (cm) 0.9 Width: (cm) 2.7 Depth: (cm) 0.2 Volume: (cm)  0.382 Electronic Signature(s) Signed: 02/26/2015 3:28:51 PM By: Loletha Grayer MD Entered By: Loletha Grayer on 02/26/2015 14:51:26 Seymore, [name] ([id number]) -------------------------------------------------------------------------------- HPI Details Patient Name: [name]. Date of Service: 02/26/2015 1:45 PM Medical Record Number: [id number] Patient Account Number: [id number] Date of Birth/Sex: 08/10/1925 (81 y.o. Male) Treating RN: Primary Care Physician: Lelon Huh Other Clinician: Referring Physician: Lelon Huh Treating Physician/Extender: BURNS III, Charlean Sanfilippo in Treatment: 4 History of Present Illness HPI Description: Pleasant 81 year old with history of diabetes (hemoglobin A1c 8.6 in April 2016), peripheral neuropathy, peripheral vascular disease (status post bilateral lower extremity stents per patient), atrial fibrillation, and chronic kidney disease. He developed a blister on his right medial calf in April 2016 and a subsequent ulceration. No significant improvement with Silvadene. He reports mild pain with pressure. No claudication or rest pain. Ambulating normally per his baseline. Seen in f/u by Dr Lucky Cowboy June 2016. R ABI 0.5. No vascular intervention recommended. Will consider arteriogram if the ulcer does not heal. Culture grew Candida. No antibiotics. Performing dressing changes with Prisma an Tubigrip for edema control with progressive improvement. He returns to clinic for follow-up and is without new complaints. No significant pain. Denies fever or chills. No significant drainage. Electronic Signature(s) Signed: 02/26/2015 3:28:51 PM By: Loletha Grayer MD Entered By: Loletha Grayer on 02/26/2015 14:55:48 Obriant, [name] ([id number]) -------------------------------------------------------------------------------- Physical Exam Details Patient Name: [name]. Date of Service: 02/26/2015 1:45 PM Medical Record Number:  [id number] Patient Account Number: [id number] Date of Birth/Sex: 08-25-1925 (81 y.o. Male) Treating RN: Primary Care Physician: Lelon Huh Other Clinician: Referring Physician: Lelon Huh Treating Physician/Extender: BURNS III, Kennley Schwandt Weeks in Treatment: 4 Constitutional . Pulse regular. Respirations normal and unlabored. Afebrile. . Notes Right medial calf ulcer improved. Diminished in size. Full thickness. No exposed deep structures. No cellulitis. 1+ pitting edema. No palpable pedal pulses per his baseline. ABI 0.5. Electronic Signature(s) Signed: 02/26/2015 3:28:51 PM By: Loletha Grayer MD Entered By: Loletha Grayer on 02/26/2015 14:56:43 [place] ([id number]) -------------------------------------------------------------------------------- Physician  Orders Details Patient Name: [name], Bridges. Date of Service: 02/26/2015 1:45 PM Medical Record Number: [id number] Patient Account Number: [id number] Date of Birth/Sex: 11/12/1924 (81 y.o. Male) Treating RN: Baruch Gouty, RN, BSN, Velva Harman Primary Care Physician: Lelon Huh Other Clinician: Referring Physician: Lelon Huh Treating Physician/Extender: BURNS III, Charlean Sanfilippo in Treatment: 4 Verbal / Phone Orders: Yes Clinician: Afful, RN, BSN, Rita Read Back and Verified: Yes Diagnosis Coding Wound Cleansing Wound #1 Right,Medial Lower Leg o Cleanse wound with mild soap and water o May Shower, gently pat wound dry prior to applying new dressing. o May shower with protection. Anesthetic Wound #1 Right,Medial Lower Leg o Topical Lidocaine 4% cream applied to wound bed prior to debridement Primary Wound Dressing Wound #1 Right,Medial Lower Leg o Prisma Ag Secondary Dressing Wound #1 Right,Medial Lower Leg o ABD and Kerlix/Conform Dressing Change Frequency Wound #1 Right,Medial Lower Leg o Change dressing every other day. Follow-up Appointments Wound #1 Right,Medial Lower Leg o Return  Appointment in 1 week. Edema Control Wound #1 Right,Medial Lower Leg o Tubigrip Additional Orders / Instructions Wound #1 Right,Medial Lower Leg o Increase protein intake. o Activity as tolerated OLUWADARASIMI, REDMON ([id number]) Electronic Signature(s) Signed: 02/26/2015 2:13:10 PM By: Regan Lemming BSN, RN Signed: 02/26/2015 3:28:51 PM By: Loletha Grayer MD Entered By: Regan Lemming on 02/26/2015 14:13:09 [place]au, [name] ([id number]) -------------------------------------------------------------------------------- Problem List Details Patient Name: GIANNI, MIHALIK H. Date of Service: 02/26/2015 1:45 PM Medical Record Number: [id number] Patient Account Number: [id number] Date of Birth/Sex: [birth year] (81 y.o. 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 Date of Service: 02/26/2015 1:45 PM Medical Record Number: [id number] Patient  Account Number: [id number] Date of Birth/Sex: 12-Apr-1925 (81 y.o. Male) Treating RN: Primary Care Physician: Lelon Huh Other Clinician: Referring Physician: Lelon Huh Treating Physician/Extender: BURNS III, Charlean Sanfilippo in Treatment: 4 Subjective Chief Complaint Information obtained from Patient Right calf ulcer. Arterial insufficiency. History of Present Illness (HPI) Pleasant 81 year old with history of diabetes (hemoglobin A1c 8.6 in April 2016), peripheral neuropathy, peripheral vascular disease (status post bilateral lower extremity stents per patient), atrial fibrillation, and chronic kidney disease. He developed a blister on his right medial calf in April 2016 and a subsequent ulceration. No significant improvement with Silvadene. He reports mild pain with pressure. No claudication or rest pain. Ambulating normally per his baseline. Seen in f/u by Dr Lucky Cowboy June 2016. R ABI 0.5. No vascular intervention recommended. Will consider arteriogram if the ulcer does not heal. Culture grew Candida. No antibiotics. Performing dressing changes with Prisma an Tubigrip for edema control with progressive improvement. He returns to clinic for follow-up and is without new complaints. No significant pain. Denies fever or chills. No significant drainage. Objective Constitutional Pulse regular. Respirations normal and  unlabored. Afebrile. Vitals Time Taken: 1:50 PM, Height: 72 in, Weight: 202.1 lbs, BMI: 27.4, Temperature: 97.8 F, Pulse: 72 bpm, Respiratory Rate: 20 breaths/min, Blood Pressure: 123/63 mmHg. General Notes: Right medial calf ulcer improved. Diminished in size. Full thickness. No exposed deep structures. No cellulitis. 1+ pitting edema. No palpable pedal pulses per his baseline. ABI 0.5. Integumentary (Hair, Skin) Wound #1 status is Open. Original cause of wound was Blister. The wound is located on the Cordero, Surette. ([id number]) Lower Leg. The wound  measures 0.9cm length x 2.7cm width x 0.1cm depth; 1.909cm^2 area and 0.191cm^3 volume. The wound is limited to skin breakdown. There is no tunneling or undermining noted. There is a small amount of serosanguineous drainage noted. The wound margin is distinct with the outline attached to the wound base. There is medium (34-66%) red, pink granulation within the wound bed. There is a medium (34-66%) amount of necrotic tissue within the wound bed including Adherent Slough. The periwound skin appearance exhibited: Moist, Hemosiderin Staining, Erythema. The periwound skin appearance did not exhibit: Callus, Crepitus, Excoriation, Fluctuance, Friable, Induration, Localized Edema, Rash, Scarring, Dry/Scaly, Maceration, Atrophie Blanche, Cyanosis, Ecchymosis, Mottled, Pallor, Rubor. The surrounding wound skin color is noted with erythema which is circumferential. Periwound temperature was noted as No Abnormality. The periwound has tenderness on palpation. 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 Arterial insufficiency. Procedures Wound #1 Wound #1 is a Diabetic Wound/Ulcer of the Lower Extremity located on the Right,Medial Lower Leg . There was a Non-Viable Tissue Open Wound/Selective [phone]) debridement with total area of 2.43 sq cm performed by BURNS III, Teressa Senter., MD. with the following instrument(s): Curette to remove Non-Viable tissue/material including Fibrin/Slough after achieving pain control using Lidocaine 4% Topical Solution. A time out was conducted prior to the start of the procedure. A Minimum amount of bleeding was controlled with  Pressure. The procedure was tolerated well with a pain level of 0 throughout and a pain level of 0 following the procedure. Post Debridement Measurements: 0.9cm length x 2.7cm width x 0.2cm depth; 0.382cm^3 volume. YOUSSEF, FOOTMAN ([id number]) Plan Wound Cleansing: Wound #1 Right,Medial Lower Leg: Cleanse wound with mild soap and water May Shower, gently pat wound dry prior to applying new dressing. May shower with protection. Anesthetic: Wound #1 Right,Medial Lower Leg: Topical Lidocaine 4% cream applied to wound bed prior to debridement Primary Wound Dressing: Wound #1 Right,Medial Lower Leg: Prisma Ag Secondary Dressing: Wound #1 Right,Medial Lower Leg: ABD and Kerlix/Conform Dressing Change Frequency: Wound #1 Right,Medial Lower Leg: Change dressing every other day. Follow-up Appointments: Wound #1 Right,Medial Lower Leg: Return Appointment in 1 week. Edema Control: Wound #1 Right,Medial Lower Leg: Tubigrip Additional Orders / Instructions: Wound #1 Right,Medial Lower Leg: Increase protein intake. Activity as tolerated Continue with Prisma dressing changes and edema control with Tubigrip. Appreciate vascular consultation. Electronic Signature(s) Signed: 02/26/2015 3:28:51 PM By: Loletha Grayer MD Entered By: Loletha Grayer on 02/26/2015 14:57:23 Vonbehren, [name] ([id number]) -------------------------------------------------------------------------------- SuperBill Details Patient Name: [name]. Date of Service: 02/26/2015 Medical Record Number: [id number] Patient Account Number: [id number] Date of Birth/Sex: April 26, 1925 (81 y.o. 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 with ulceration of  calf E11.622 Type 2 diabetes mellitus with other skin ulcer E11.43 Type 2 diabetes mellitus with diabetic autonomic (poly)neuropathy E11.22 Type 2 diabetes mellitus with diabetic chronic kidney disease I48.2 Chronic atrial fibrillation Facility Procedures CPT4: Description Modifier Quantity Code [id number] - DEBRIDE WOUND 1ST 20 SQ CM OR < 1 ICD-10 Description Diagnosis 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

## 2015-03-11 ENCOUNTER — Encounter: Payer: Medicare Other | Admitting: Surgery

## 2015-03-11 DIAGNOSIS — L97219 Non-pressure chronic ulcer of right calf with unspecified severity: Secondary | ICD-10-CM | POA: Diagnosis not present

## 2015-03-12 NOTE — Progress Notes (Signed)
[name], Bridges ([id number]) Visit Report for 03/11/2015 Arrival Information Details Patient Name: [name], [name]. Date of Service: 03/11/2015 1:00 PM Medical Record Number: [id number] Patient Account Number: [id number] Date of Birth/Sex: August 15, 1925 (81 y.o. Male) Treating RN: Baruch Gouty, RN, BSN, Velva Harman Primary Care Physician: Lelon Huh Other Clinician: Referring Physician: Lelon Huh Treating Physician/Extender: Frann Rider in Treatment: 5 Visit Information History Since Last Visit Any new allergies or adverse reactions: No Patient Arrived: Ambulatory Had a fall or experienced change in No Arrival Time: 13:11 activities of daily living that may affect Accompanied By: self risk of falls: Transfer Assistance: None Signs or symptoms of abuse/neglect since last No Patient Identification Verified: Yes visito Secondary Verification Process Yes Hospitalized since last visit: No Completed: Has Dressing in Place as Prescribed: Yes Patient Has Alerts: Yes Pain Present Now: No Patient Alerts: Patient on Blood Thinner DMII Plavix and ASA 5/16 ABI (L/R)- inaudible Electronic Signature(s) Signed: 03/11/2015 4:21:14 PM By: Regan Lemming BSN, RN Entered By: Regan Lemming on 03/11/2015 13:12:28 Berberich, [name] ([id number]) -------------------------------------------------------------------------------- Encounter Discharge Information Details Patient Name: [name]. Date of Service: 03/11/2015 1:00 PM Medical Record Number: [id number] Patient Account Number: [id number] Date of Birth/Sex: May 17, 1925 (81 y.o. Male) Treating RN: Baruch Gouty, RN, BSN, Velva Harman Primary Care Physician: Lelon Huh Other Clinician: Referring Physician: Lelon Huh Treating Physician/Extender: Frann Rider in Treatment: 5 Encounter Discharge Information Items Discharge Pain Level: 0 Discharge Condition: Stable Ambulatory Status: Ambulatory Discharge Destination:  Home Private Transportation: Auto Schedule Follow-up Appointment: No Medication Reconciliation completed and No provided to Patient/Care Merced Brougham: Clinical Summary of Care: Electronic Signature(s) Signed: 03/11/2015 4:21:14 PM By: Regan Lemming BSN, RN Entered By: Regan Lemming on 03/11/2015 13:52:15 Vanallen, [name] ([id number]) -------------------------------------------------------------------------------- Lower Extremity Assessment Details Patient Name: [name]. Date of Service: 03/11/2015 1:00 PM Medical Record Number: [id number] Patient Account Number: [id number] Date of Birth/Sex: Mar 15, 1925 (81 y.o. Male) Treating RN: Baruch Gouty, RN, BSN, Velva Harman Primary Care Physician: Lelon Huh Other Clinician: Referring Physician: Lelon Huh Treating Physician/Extender: Frann Rider in Treatment: 5 Edema Assessment Assessed: [Left: No] [Right: No] E[Left: dema] [Right: :] Calf Left: Right: Point of Measurement: 35 cm From Medial Instep cm 35.2 cm Ankle Left: Right: Point of Measurement: 12 cm From Medial Instep cm 22.4 cm Vascular Assessment Pulses: Posterior Tibial Dorsalis Pedis Palpable: [Right:Yes] Extremity colors, hair growth, and conditions: Extremity Color: [Right:Mottled] Hair Growth on Extremity: [Right:Yes] Temperature of Extremity: [Right:Warm] Capillary Refill: [Right:< 3 seconds] Dependent Rubor: [Right:No] Blanched when Elevated: [Right:No] Lipodermatosclerosis: [Right:No] Toe Nail Assessment Left: Right: Thick: Yes Discolored: Yes Deformed: No Improper Length and Hygiene: No Electronic Signature(s) Signed: 03/11/2015 4:21:14 PM By: Regan Lemming BSN, RN Coley, [name] ([id number]) Entered By: Regan Lemming on 03/11/2015 13:18:42 Stolz, [name] ([id number]) -------------------------------------------------------------------------------- Multi Wound Chart Details Patient Name: [name]. Date of Service: 03/11/2015 1:00  PM Medical Record Number: [id number] Patient Account Number: [id number] Date of Birth/Sex: [birth year] (81 y.o. Male) Treating RN: [name] Primary Care Physician: Lelon Huh Other Clinician: Referring Physician: Lelon Huh Treating Physician/Extender: Frann Rider in Treatment: 5 Vital Signs Height(in): 72 Pulse(bpm): 80 Weight(lbs): 202.1 Blood Pressure 125/63 (mmHg): Body Mass Index(BMI): 27 Temperature(F): 98.4 Respiratory Rate 18 (breaths/min): Photos: [1:No Photos] [N/A:N/A] Wound Location: [1:Right Lower Leg - Medial N/A] Wounding Event: [1:Blister] [N/A:N/A] Primary Etiology: [1:Diabetic Wound/Ulcer of N/A the Lower Extremity] Comorbid History: [1:Cataracts, Sleep Apnea, N/A Arrhythmia, Congestive Heart Failure, Hypertension, Peripheral Arterial Disease, Type II Diabetes, Gout, Osteoarthritis, Neuropathy] Date Acquired: [1:12/30/2014] [N/A:N/A] Weeks  of Treatment: [1:5] [N/A:N/A] Wound Status: [1:Open] [N/A:N/A] Measurements L x W x D 0.7x1.3x0.1 [N/A:N/A] (cm) Area (cm) : [1:0.715] [N/A:N/A] Volume (cm) : [1:0.071] [N/A:N/A] % Reduction in Area: [1:76.40%] [N/A:N/A] % Reduction in Volume: 76.50% [N/A:N/A] Classification: [1:Grade 1] [N/A:N/A] Exudate Amount: [1:Small] [N/A:N/A] Exudate Type: [1:Serosanguineous] [N/A:N/A] Exudate Color: [1:red, brown] [N/A:N/A] Wound Margin: [1:Distinct, outline attached N/A] Granulation Amount: [1:Medium (34-66%)] [N/A:N/A] Granulation Quality: [1:Red, Pink] [N/A:N/A] Necrotic Amount: [1:Medium (34-66%)] [N/A:N/A] Exposed Structures: [N/A:N/A] Fascia: No Fat: No Tendon: No Muscle: No Joint: No Bone: No Limited to Skin Breakdown Epithelialization: Small (1-33%) N/A N/A Periwound Skin Texture: Edema: Yes N/A N/A Excoriation: No Induration: No Callus: No Crepitus: No Fluctuance: No Friable: No Rash: No Scarring: No Periwound Skin Moist: Yes N/A N/A Moisture: Maceration: No Dry/Scaly:  No Periwound Skin Color: Erythema: Yes N/A N/A Hemosiderin Staining: Yes Atrophie Blanche: No Cyanosis: No Ecchymosis: No Mottled: No Pallor: No Rubor: No Erythema Location: Circumferential N/A N/A Temperature: No Abnormality N/A N/A Tenderness on Yes N/A N/A Palpation: Wound Preparation: Ulcer Cleansing: N/A N/A Rinsed/Irrigated with Saline Topical Anesthetic Applied: Other: Lidocaine 4% Ointment Treatment Notes Electronic Signature(s) Signed: 03/11/2015 4:58:02 PM By: Gretta Cool, RN, BSN, Kim RN, BSN Entered By: Gretta Cool, RN, BSN, Kim on 03/11/2015 13:25:32 Barnhill, [name] ([id number]) -------------------------------------------------------------------------------- Multi-Disciplinary Care Plan Details Patient Name: DILYN, SMILES. Date of Service: 03/11/2015 1:00 PM Medical Record Number: [id number] Patient Account Number: [id number] Date of Birth/Sex: [birth year] (81 y.o. Male) Treating RN: [name] Primary Care Physician: Lelon Huh Other Clinician: Referring Physician: Lelon Huh Treating Physician/Extender: Frann Rider in Treatment: 5 Active Inactive Abuse / Safety / Falls / Self Care Management Nursing Diagnoses: Impaired home maintenance Impaired physical mobility Knowledge deficit related to: safety; personal, health (wound), emergency Potential for falls Self care deficit: actual or potential Goals: Patient will remain injury free Date Initiated: 01/29/2015 Goal Status: Active Patient/caregiver will verbalize understanding of skin care regimen Date Initiated: 01/29/2015 Goal Status: Active Patient/caregiver will verbalize/demonstrate measure taken to improve self care Date Initiated: 01/29/2015 Goal Status: Active Patient/caregiver will verbalize/demonstrate measures taken to improve the patient's personal safety Date Initiated: 01/29/2015 Goal Status: Active Patient/caregiver will verbalize/demonstrate measures taken to prevent injury and/or  falls Date Initiated: 01/29/2015 Goal Status: Active Patient/caregiver will verbalize/demonstrate understanding of what to do in case of emergency Date Initiated: 01/29/2015 Goal Status: Active Interventions: Assess: immobility, friction, shearing, incontinence upon admission and as needed Assess impairment of mobility on admission and as needed per policy Assess self care needs on admission and as needed Patient referred to community resources (specify in notes) Provide education on basic hygiene TILMAN, MCCLAREN ([id number]) Provide education on fall prevention Provide education on personal and home safety Provide education on safe transfers Treatment Activities: Education provided on Basic Hygiene : 01/29/2015 Notes: Orientation to the Wound Care Program Nursing Diagnoses: Knowledge deficit related to the wound healing center program Goals: Patient/caregiver will verbalize understanding of the [place] [facility] Date Initiated: 01/29/2015 Goal Status: Active Interventions: Provide education on orientation to the wound center Notes: Venous Leg Ulcer Nursing Diagnoses: Knowledge deficit related to disease process and management Potential for venous Insuffiency (use before diagnosis confirmed) Goals: Patient will maintain optimal edema control Date Initiated: 01/29/2015 Goal Status: Active Patient/caregiver will verbalize understanding of disease process and disease management Date Initiated: 01/29/2015 Goal Status: Active Verify adequate tissue perfusion prior to therapeutic compression application Date Initiated: 01/29/2015 Goal Status: Active Interventions: Assess peripheral edema status every visit. Compression as ordered Provide education on  venous insufficiency Notes: OVADIA, LOPP ([id number]) Wound/Skin Impairment Nursing Diagnoses: Impaired tissue integrity Knowledge deficit related to smoking impact on wound healing Knowledge deficit related to  ulceration/compromised skin integrity Goals: Patient/caregiver will verbalize understanding of skin care regimen Date Initiated: 01/29/2015 Goal Status: Active Ulcer/skin breakdown will have a volume reduction of 30% by week 4 Date Initiated: 01/29/2015 Goal Status: Active Ulcer/skin breakdown will have a volume reduction of 50% by week 8 Date Initiated: 01/29/2015 Goal Status: Active Ulcer/skin breakdown will have a volume reduction of 80% by week 12 Date Initiated: 01/29/2015 Goal Status: Active Ulcer/skin breakdown will heal within 14 weeks Date Initiated: 01/29/2015 Goal Status: Active Interventions: Assess patient/caregiver ability to obtain necessary supplies Assess patient/caregiver ability to perform ulcer/skin care regimen upon admission and as needed Assess ulceration(s) every visit Provide education on ulcer and skin care Notes: Electronic Signature(s) Signed: 03/11/2015 4:58:02 PM By: Gretta Cool, RN, BSN, Kim RN, BSN Entered By: Gretta Cool, RN, BSN, Kim on 03/11/2015 13:25:24 Devins, [name] ([id number]) -------------------------------------------------------------------------------- Pain Assessment Details Patient Name: [name]. Date of Service: 03/11/2015 1:00 PM Medical Record Number: [id number] Patient Account Number: [id number] Date of Birth/Sex: 1924/12/21 (81 y.o. Male) Treating RN: Baruch Gouty, RN, BSN, Velva Harman Primary Care Physician: Lelon Huh Other Clinician: Referring Physician: Lelon Huh Treating Physician/Extender: Frann Rider in Treatment: 5 Active Problems Location of Pain Severity and Description of Pain Patient Has Paino No Site Locations Pain Management and Medication Current Pain Management: Electronic Signature(s) Signed: 03/11/2015 4:21:14 PM By: Regan Lemming BSN, RN Entered By: Regan Lemming on 03/11/2015 13:12:40 Deschene, [name]  ([id number]) -------------------------------------------------------------------------------- Patient/Caregiver Education Details Patient Name: [name]. Date of Service: 03/11/2015 1:00 PM Medical Record Number: [id number] Patient Account Number: [id number] Date of Birth/Gender: 03-16-1925 (81 y.o. Male) Treating RN: Baruch Gouty, RN, BSN, Velva Harman Primary Care Physician: Lelon Huh Other Clinician: Referring Physician: Lelon Huh Treating Physician/Extender: Frann Rider in Treatment: 5 Education Assessment Education Provided To: Patient Education Topics Provided Basic Hygiene: Methods: Explain/Verbal Responses: State content correctly Safety: Methods: Explain/Verbal Responses: State content correctly Venous: Methods: Explain/Verbal Responses: State content correctly Welcome To [facility]: Methods: Explain/Verbal Responses: State content correctly Wound/Skin Impairment: Methods: Explain/Verbal Responses: State content correctly Electronic Signature(s) Signed: 03/11/2015 4:21:14 PM By: Regan Lemming BSN, RN Entered By: Regan Lemming on 03/11/2015 13:52:48 Russell, [name] ([id number]) -------------------------------------------------------------------------------- Wound Assessment Details Patient Name: [name]. Date of Service: 03/11/2015 1:00 PM Medical Record Number: [id number] Patient Account Number: [id number] Date of Birth/Sex: [birth year] (81 y.o. Male) Treating RN: Baruch Gouty, RN, BSN, [place] Primary Care Physician: Lelon Huh Other Clinician: Referring Physician: Lelon Huh Treating Physician/Extender: Frann Rider in Treatment: 5 Wound Status Wound Number: 1 Primary Diabetic Wound/Ulcer of the Lower Etiology: Extremity Wound Location: Right Lower Leg - Medial Wound Open Wounding Event: Blister Status: Date Acquired: 12/30/2014 Comorbid Cataracts, Sleep Apnea, Arrhythmia, Weeks Of Treatment: 5 History: Congestive Heart  Failure, Hypertension, Clustered Wound: No Peripheral Arterial Disease, Type II Diabetes, Gout, Osteoarthritis, Neuropathy Photos Photo Uploaded By: Regan Lemming on 03/11/2015 16:11:51 Wound Measurements Length: (cm) 0.7 Width: (cm) 1.3 Depth: (cm) 0.1 Area: (cm) 0.715 Volume: (cm) 0.071 % Reduction in Area: 76.4% % Reduction in Volume: 76.5% Epithelialization: Small (1-33%) Tunneling: No Undermining: No Wound Description Classification: Grade 1 Wound Margin: Distinct, outline attached Exudate Amount: Small Exudate Type: Serosanguineous Exudate Color: red, brown Foul Odor After Cleansing: No Wound Bed Granulation Amount: Medium (34-66%) Exposed Structure Granulation Quality: Red, Pink Fascia Exposed: No Marchiano, Infant H. ([id number]) Necrotic  Amount: Medium (34-66%) Fat Layer Exposed: No Necrotic Quality: Adherent Slough Tendon Exposed: No Muscle Exposed: No Joint Exposed: No Bone Exposed: No Limited to Skin Breakdown Periwound Skin Texture Texture Color No Abnormalities Noted: No No Abnormalities Noted: No Callus: No Atrophie Blanche: No Crepitus: No Cyanosis: No Excoriation: No Ecchymosis: No Fluctuance: No Erythema: Yes Friable: No Erythema Location: Circumferential Induration: No Hemosiderin Staining: Yes Localized Edema: Yes Mottled: No Rash: No Pallor: No Scarring: No Rubor: No Moisture Temperature / Pain No Abnormalities Noted: No Temperature: No Abnormality Dry / Scaly: No Tenderness on Palpation: Yes Maceration: No Moist: Yes Wound Preparation Ulcer Cleansing: Rinsed/Irrigated with Saline Topical Anesthetic Applied: Other: Lidocaine 4% Ointment , Treatment Notes Wound #1 (Right, Medial Lower Leg) 1. Cleansed with: Clean wound with Normal Saline May Shower, gently pat wound dry prior to applying new dressing. 3. Peri-wound Care: Skin Prep 4. Dressing Applied: Prisma Ag 5. Secondary Dressing Applied Gauze and  Kerlix/Conform 7. Secured with Tape Tubigrip Electronic Signature(s) Signed: 03/11/2015 4:21:14 PM By: Regan Lemming BSN, RN Peppel, Burden ([id number]) Entered By: Regan Lemming on 03/11/2015 13:20:41 RYETT, HAMMAN ([id number]) -------------------------------------------------------------------------------- Vitals Details Patient Name: [name]. Date of Service: 03/11/2015 1:00 PM Medical Record Number: [id number] Patient Account Number: [id number] Date of Birth/Sex: 1925/08/09 (81 y.o. Male) Treating RN: Baruch Gouty, RN, BSN, [place] Primary Care Physician: Lelon Huh Other Clinician: Referring Physician: Lelon Huh Treating Physician/Extender: Frann Rider in Treatment: 5 Vital Signs Time Taken: 13:12 Temperature (F): 98.4 Height (in): 72 Pulse (bpm): 80 Weight (lbs): 202.1 Respiratory Rate (breaths/min): 18 Body Mass Index (BMI): 27.4 Blood Pressure (mmHg): 125/63 Reference Range: 80 - 120 mg / dl Electronic Signature(s) Signed: 03/11/2015 4:21:14 PM By: Regan Lemming BSN, RN Entered By: Regan Lemming on 03/11/2015 13:13:28

## 2015-03-12 NOTE — Progress Notes (Signed)
[name] ([id number]) Visit Report for 03/11/2015 Chief Complaint Document Details Patient Name: [name], [name]. Date of Service: 03/11/2015 1:00 PM Medical Record Number: [id number] Patient Account Number: [id number] Date of Birth/Sex: 02-22-1925 (81 y.o. Male) Treating RN: Primary Care Physician: Lelon Huh Other Clinician: Referring Physician: Lelon Huh Treating Physician/Extender: Frann Rider in Treatment: 5 Information Obtained from: Patient Chief Complaint Right calf ulcer. Arterial insufficiency. Electronic Signature(s) Signed: 03/11/2015 4:09:50 PM By: Christin Fudge MD, FACS Entered By: Christin Fudge on 03/11/2015 13:31:23 [name] ([id number]) -------------------------------------------------------------------------------- Debridement Details Patient Name: [name]. Date of Service: 03/11/2015 1:00 PM Medical Record Number: [id number] Patient Account Number: [id number] Date of Birth/Sex: December 03, 1924 (81 y.o. Male) Treating RN: Primary Care Physician: Lelon Huh Other Clinician: Referring Physician: Lelon Huh Treating Physician/Extender: Frann Rider in Treatment: 5 Debridement Performed for Wound #1 Right,Medial Lower Leg Assessment: Performed By: Physician Pat Patrick., MD Debridement: Debridement Pre-procedure Yes Verification/Time Out Taken: Start Time: 13:25 Pain Control: Other : lidocaine 4% Level: Skin/Subcutaneous Tissue Total Area Debrided (L x 0.7 (cm) x 1.3 (cm) = 0.91 (cm) W): Tissue and other Viable, Non-Viable, Eschar, Fibrin/Slough, Subcutaneous material debrided: Instrument: Curette Bleeding: Minimum Hemostasis Achieved: Pressure End Time: 13:29 Procedural Pain: 0 Post Procedural Pain: 0 Response to Treatment: Procedure was tolerated well Post Debridement Measurements of Total Wound Length: (cm) 0.7 Width: (cm) 1.3 Depth: (cm) 0.1 Volume: (cm) 0.071 Electronic  Signature(s) Signed: 03/11/2015 4:09:50 PM By: Christin Fudge MD, FACS Entered By: Christin Fudge on 03/11/2015 13:31:04 [name] ([id number]) -------------------------------------------------------------------------------- HPI Details Patient Name: [name]. Date of Service: 03/11/2015 1:00 PM Medical Record Number: [id number] Patient Account Number: [id number] Date of Birth/Sex: 1925/01/28 (81 y.o. Male) Treating RN: Primary Care Physician: Lelon Huh Other Clinician: Referring Physician: Lelon Huh Treating Physician/Extender: Frann Rider in Treatment: 5 History of Present Illness HPI Description: Pleasant 81 year old with history of diabetes (hemoglobin A1c 8.6 in April 2016), peripheral neuropathy, peripheral vascular disease (status post bilateral lower extremity stents per patient), atrial fibrillation, and chronic kidney disease. He developed a blister on his right medial calf in April 2016 and a subsequent ulceration. No significant improvement with Silvadene. He reports mild pain with pressure. No claudication or rest pain. Ambulating normally per his baseline. Seen in f/u by Dr Lucky Cowboy June 2016. R ABI 0.5. No vascular intervention recommended. Will consider arteriogram if the ulcer does not heal. Culture grew Candida. No antibiotics. Performing dressing changes with Prisma an Tubigrip for edema control with progressive improvement. He returns to clinic for follow-up and is without new complaints. No significant pain. Denies fever or chills. No significant drainage. Electronic Signature(s) Signed: 03/11/2015 4:09:50 PM By: Christin Fudge MD, FACS Entered By: Christin Fudge on 03/11/2015 13:31:29 [name], [name] ([id number]) -------------------------------------------------------------------------------- Physical Exam Details Patient Name: [name]. Date of Service: 03/11/2015 1:00 PM Medical Record Number: [id number] Patient Account  Number: [id number] Date of Birth/Sex: [birth year] (81 y.o. Male) Treating RN: Primary Care Physician: Lelon Huh Other Clinician: Referring Physician: Lelon Huh Treating Physician/Extender: Frann Rider in Treatment: 5 Constitutional . Pulse regular. Respirations normal and unlabored. Afebrile. . Eyes Nonicteric. Reactive to light. Ears, Nose, Mouth, and Throat Lips, teeth, and gums WNL.Marland Kitchen Moist mucosa without lesions . Neck supple and nontender. No palpable supraclavicular or cervical adenopathy. Normal sized without goiter. Respiratory WNL. No retractions.. Cardiovascular Pedal Pulses WNL. No clubbing, cyanosis or edema. Musculoskeletal Adexa without tenderness or enlargement.. Digits and nails w/o clubbing, cyanosis, infection, petechiae, ischemia, or  inflammatory conditions.. Integumentary (Hair, Skin) the wound has minimal debris had needs some sharp debridement. No crepitus or fluctuance. No peri- wound warmth or erythema. No masses.Marland Kitchen Psychiatric Judgement and insight Intact.. No evidence of depression, anxiety, or agitation.. Electronic Signature(s) Signed: 03/11/2015 4:09:50 PM By: Christin Fudge MD, FACS Entered By: Christin Fudge on 03/11/2015 13:32:04 [name], [name] ([id number]) -------------------------------------------------------------------------------- Physician Orders Details Patient Name: [name]. Date of Service: 03/11/2015 1:00 PM Medical Record Number: [id number] Patient Account Number: [id number] Date of Birth/Sex: 1925/06/16 (81 y.o. Male) Treating RN: [name] Primary Care Physician: Lelon Huh Other Clinician: Referring Physician: Lelon Huh Treating Physician/Extender: Frann Rider in Treatment: 5 Verbal / Phone Orders: Yes Clinician: [name] Read Back and Verified: Yes Diagnosis Coding Wound Cleansing Wound #1 Right,Medial Lower Leg o Cleanse wound with mild soap and water o May Shower, gently pat  wound dry prior to applying new dressing. o May shower with protection. Anesthetic Wound #1 Right,Medial Lower Leg o Topical Lidocaine 4% cream applied to wound bed prior to debridement Primary Wound Dressing Wound #1 Right,Medial Lower Leg o Prisma Ag Secondary Dressing Wound #1 Right,Medial Lower Leg o ABD and Kerlix/Conform Dressing Change Frequency Wound #1 Right,Medial Lower Leg o Change dressing every other day. Follow-up Appointments Wound #1 Right,Medial Lower Leg o Return Appointment in 1 week. Edema Control Wound #1 Right,Medial Lower Leg o Tubigrip Additional Orders / Instructions Wound #1 Right,Medial Lower Leg o Increase protein intake. o Activity as tolerated [name], [name] ([id number]) Electronic Signature(s) Signed: 03/11/2015 4:09:50 PM By: Christin Fudge MD, FACS Signed: 03/11/2015 4:58:02 PM By: Gretta Cool RN, BSN, Kim RN, BSN Entered By: Gretta Cool, RN, BSN, Kim on 03/11/2015 13:29:44 Hayton, [name] ([id number]) -------------------------------------------------------------------------------- Problem List Details Patient Name: [name], [name]. Date of Service: 03/11/2015 1:00 PM Medical Record Number: [id number] Patient Account Number: [id number] Date of Birth/Sex: [birth year] (81 y.o. 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 Date of Service: 03/11/2015 1:00 PM Medical Record Number: [id number] Patient Account Number: [id number] Date of Birth/Sex: 08-Jun-1925 (81 y.o. Male) Treating RN: Primary Care Physician: Lelon Huh Other Clinician: Referring Physician: Lelon Huh Treating Physician/Extender: Frann Rider in Treatment: 5 Subjective Chief Complaint Information obtained from Patient Right calf ulcer. Arterial insufficiency. History of Present Illness (HPI) Pleasant 81 year old with history of diabetes (hemoglobin A1c 8.6 in April 2016), peripheral neuropathy, peripheral vascular disease (status post bilateral lower extremity stents per patient), atrial fibrillation, and chronic kidney disease. He developed a blister on his right medial calf in April 2016 and a subsequent ulceration. No significant improvement with Silvadene. He reports mild pain with pressure. No claudication or rest pain. Ambulating normally per his baseline. Seen in f/u by Dr Lucky Cowboy June 2016. R ABI 0.5.  No vascular intervention recommended. Will consider arteriogram if the ulcer does not heal. Culture grew Candida. No antibiotics. Performing dressing changes with Prisma an Tubigrip for edema control with progressive improvement. He returns to clinic for follow-up and is without new complaints. No significant pain. Denies fever or chills. No significant drainage. Objective Constitutional Pulse regular. Respirations normal and unlabored. Afebrile. Vitals Time Taken: 1:12 PM, Height: 72 in,  Weight: 202.1 lbs, BMI: 27.4, Temperature: 98.4 F, Pulse: 80 bpm, Respiratory Rate: 18 breaths/min, Blood Pressure: 125/63 mmHg. Eyes Nonicteric. Reactive to light. Ears, Nose, Mouth, and Throat Lips, teeth, and gums WNL.Marland Kitchen Moist mucosa without lesions . [name], [name] ([id number]) Neck supple and nontender. No palpable supraclavicular or cervical adenopathy. Normal sized without goiter. Respiratory WNL. No retractions.. Cardiovascular Pedal Pulses WNL. No clubbing, cyanosis or edema. Musculoskeletal Adexa without tenderness or enlargement.. Digits and nails w/o clubbing, cyanosis, infection, petechiae, ischemia, or inflammatory conditions.Marland Kitchen Psychiatric Judgement and insight Intact.. No evidence of depression, anxiety, or agitation.. Integumentary (Hair, Skin) the wound has minimal debris had needs some sharp debridement. No crepitus or fluctuance. No peri- wound warmth or erythema. No masses.. Wound #1 status is Open. Original cause of wound was Blister. The wound is located on the Right,Medial Lower Leg. The wound measures 0.7cm length x 1.3cm width x 0.1cm depth; 0.715cm^2 area and 0.071cm^3 volume. The wound is limited to skin breakdown. There is no tunneling or undermining noted. There is a small amount of serosanguineous drainage noted. The wound margin is distinct with the outline attached to the wound base. There is medium (34-66%) red, pink granulation within the wound bed. There is a medium (34-66%) amount of necrotic tissue within the wound bed including Adherent Slough. The periwound skin appearance exhibited: Localized Edema, Moist, Hemosiderin Staining, Erythema. The periwound skin appearance did not exhibit: Callus, Crepitus, Excoriation, Fluctuance, Friable, Induration, Rash, Scarring, Dry/Scaly, Maceration, Atrophie Blanche, Cyanosis, Ecchymosis, Mottled, Pallor, Rubor. The surrounding wound skin color is noted with erythema which is circumferential. Periwound  temperature was noted as No Abnormality. The periwound has tenderness on palpation. 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 ([id number]) After sharply debriding the wound looks quite clean and we will continue with Prisma and local border. Come and see Dr. Quay Burow next Wednesday. Procedures Wound #1 Wound #1 is a Diabetic Wound/Ulcer of the Lower Extremity located on the Right,Medial Lower Leg . There was a Skin/Subcutaneous Tissue Debridement ([id number]) debridement with total area of 0.91 sq cm performed by Annamaria Salah, Jackson Latino., MD. with the following instrument(s): Curette to remove Viable and Non-Viable tissue/material including Fibrin/Slough, Eschar, and Subcutaneous after achieving pain control using Other (lidocaine 4%). A time out was conducted prior to the start of the procedure. A Minimum amount of bleeding was controlled with Pressure. The procedure was tolerated well with a pain level of 0 throughout and a pain level of 0 following the procedure. Post Debridement Measurements: 0.7cm length x 1.3cm width x 0.1cm depth; 0.071cm^3 volume. Plan Wound Cleansing: Wound #1 Right,Medial Lower Leg: Cleanse wound with mild soap and water May Shower, gently pat wound dry prior to applying new dressing. May shower with protection. Anesthetic: Wound #1 Right,Medial Lower Leg: Topical Lidocaine 4% cream applied to  wound bed prior to debridement Primary Wound Dressing: Wound #1 Right,Medial Lower Leg: Prisma Ag Secondary Dressing: Wound #1 Right,Medial Lower Leg: ABD and Kerlix/Conform Dressing Change Frequency: Wound #1 Right,Medial Lower  Leg: Change dressing every other day. Follow-up Appointments: Wound #1 Right,Medial Lower Leg: Return Appointment in 1 week. Edema Control: Wound #1 Right,Medial Lower Leg: Tubigrip Additional Orders / Instructions: [name], [name]. ([id number]) Wound #1 Right,Medial Lower Leg: Increase protein intake. Activity as tolerated After sharply debriding the wound looks quite clean and we will continue with Prisma and local border. Come and see Dr. Quay Burow next Wednesday. Electronic Signature(s) Signed: 03/11/2015 4:09:50 PM By: Christin Fudge MD, FACS Entered By: Christin Fudge on 03/11/2015 13:32:50 [name], [name] ([id number]) -------------------------------------------------------------------------------- SuperBill Details Patient Name: [name]. Date of Service: 03/11/2015 Medical Record Number: [id number] Patient Account Number: [id number] Date of Birth/Sex: 1925/08/25 (81 y.o. 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 E11.622 Type 2 diabetes mellitus with other skin ulcer E11.43 Type 2 diabetes mellitus with diabetic autonomic (poly)neuropathy E11.22 Type 2 diabetes mellitus with diabetic chronic kidney disease I48.2 Chronic atrial fibrillation Facility Procedures CPT4: Description Modifier Quantity Code [id number] 11042 - DEB SUBQ TISSUE 20 SQ CM/< 1 ICD-10 Description Diagnosis 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

## 2015-03-14 ENCOUNTER — Emergency Department: Payer: Medicare Other

## 2015-03-14 ENCOUNTER — Encounter: Payer: Self-pay | Admitting: Radiology

## 2015-03-14 ENCOUNTER — Observation Stay
Admission: EM | Admit: 2015-03-14 | Discharge: 2015-03-16 | Disposition: A | Discharge status: Home / Self Care | Payer: Medicare Other | Attending: Internal Medicine | Admitting: Internal Medicine

## 2015-03-14 DIAGNOSIS — Z79899 Other long term (current) drug therapy: Secondary | ICD-10-CM | POA: Diagnosis not present

## 2015-03-14 DIAGNOSIS — Z7902 Long term (current) use of antithrombotics/antiplatelets: Secondary | ICD-10-CM | POA: Diagnosis not present

## 2015-03-14 DIAGNOSIS — Z85828 Personal history of other malignant neoplasm of skin: Secondary | ICD-10-CM | POA: Diagnosis not present

## 2015-03-14 DIAGNOSIS — Z7982 Long term (current) use of aspirin: Secondary | ICD-10-CM | POA: Insufficient documentation

## 2015-03-14 DIAGNOSIS — E119 Type 2 diabetes mellitus without complications: Secondary | ICD-10-CM | POA: Insufficient documentation

## 2015-03-14 DIAGNOSIS — Z955 Presence of coronary angioplasty implant and graft: Secondary | ICD-10-CM | POA: Diagnosis not present

## 2015-03-14 DIAGNOSIS — I10 Essential (primary) hypertension: Secondary | ICD-10-CM | POA: Insufficient documentation

## 2015-03-14 DIAGNOSIS — M109 Gout, unspecified: Secondary | ICD-10-CM | POA: Diagnosis not present

## 2015-03-14 DIAGNOSIS — K8689 Other specified diseases of pancreas: Secondary | ICD-10-CM | POA: Diagnosis present

## 2015-03-14 DIAGNOSIS — I739 Peripheral vascular disease, unspecified: Secondary | ICD-10-CM | POA: Diagnosis not present

## 2015-03-14 DIAGNOSIS — E039 Hypothyroidism, unspecified: Secondary | ICD-10-CM | POA: Diagnosis not present

## 2015-03-14 DIAGNOSIS — E785 Hyperlipidemia, unspecified: Secondary | ICD-10-CM | POA: Insufficient documentation

## 2015-03-14 DIAGNOSIS — R1013 Epigastric pain: Secondary | ICD-10-CM | POA: Diagnosis not present

## 2015-03-14 DIAGNOSIS — I2510 Atherosclerotic heart disease of native coronary artery without angina pectoris: Secondary | ICD-10-CM | POA: Diagnosis not present

## 2015-03-14 DIAGNOSIS — D490 Neoplasm of unspecified behavior of digestive system: Secondary | ICD-10-CM | POA: Diagnosis present

## 2015-03-14 HISTORY — DX: Malignant (primary) neoplasm, unspecified: C80.1

## 2015-03-14 HISTORY — DX: Gout, unspecified: M10.9

## 2015-03-14 HISTORY — DX: Peripheral vascular disease, unspecified: I73.9

## 2015-03-14 HISTORY — DX: Essential (primary) hypertension: I10

## 2015-03-14 HISTORY — DX: Type 2 diabetes mellitus without complications: E11.9

## 2015-03-14 HISTORY — DX: Hypothyroidism, unspecified: E03.9

## 2015-03-14 LAB — URINALYSIS COMPLETE WITH MICROSCOPIC (ARMC ONLY)
Bacteria, UA: NONE SEEN
Bilirubin Urine: NEGATIVE
Glucose, UA: NEGATIVE mg/dL
Ketones, ur: NEGATIVE mg/dL
LEUKOCYTES UA: NEGATIVE
NITRITE: NEGATIVE
PROTEIN: NEGATIVE mg/dL
Specific Gravity, Urine: 1.027 (ref 1.005–1.030)
Squamous Epithelial / LPF: NONE SEEN
pH: 7 (ref 5.0–8.0)

## 2015-03-14 LAB — CBC WITH DIFFERENTIAL/PLATELET
BASOS ABS: 0 10*3/uL (ref 0–0.1)
BASOS PCT: 0 %
EOS ABS: 0.1 10*3/uL (ref 0–0.7)
EOS PCT: 1 %
HCT: 46.3 % (ref 40.0–52.0)
Hemoglobin: 14.9 g/dL (ref 13.0–18.0)
LYMPHS ABS: 0.7 10*3/uL — AB (ref 1.0–3.6)
Lymphocytes Relative: 9 %
MCH: 31.4 pg (ref 26.0–34.0)
MCHC: 32.3 g/dL (ref 32.0–36.0)
MCV: 97.3 fL (ref 80.0–100.0)
Monocytes Absolute: 0.6 10*3/uL (ref 0.2–1.0)
Monocytes Relative: 8 %
Neutro Abs: 6.7 10*3/uL — ABNORMAL HIGH (ref 1.4–6.5)
Neutrophils Relative %: 82 %
PLATELETS: 145 10*3/uL — AB (ref 150–440)
RBC: 4.76 MIL/uL (ref 4.40–5.90)
RDW: 15.3 % — ABNORMAL HIGH (ref 11.5–14.5)
WBC: 8.2 10*3/uL (ref 3.8–10.6)

## 2015-03-14 LAB — COMPREHENSIVE METABOLIC PANEL
ALK PHOS: 91 U/L (ref 38–126)
ALT: 15 U/L — AB (ref 17–63)
AST: 25 U/L (ref 15–41)
Albumin: 4.2 g/dL (ref 3.5–5.0)
Anion gap: 10 (ref 5–15)
BUN: 23 mg/dL — ABNORMAL HIGH (ref 6–20)
CALCIUM: 9.1 mg/dL (ref 8.9–10.3)
CO2: 34 mmol/L — ABNORMAL HIGH (ref 22–32)
Chloride: 96 mmol/L — ABNORMAL LOW (ref 101–111)
Creatinine, Ser: 1.72 mg/dL — ABNORMAL HIGH (ref 0.61–1.24)
GFR calc Af Amer: 39 mL/min — ABNORMAL LOW (ref >60)
GFR calc non Af Amer: 34 mL/min — ABNORMAL LOW (ref >60)
Glucose, Bld: 97 mg/dL (ref 65–99)
POTASSIUM: 4 mmol/L (ref 3.5–5.1)
SODIUM: 140 mmol/L (ref 135–145)
Total Bilirubin: 0.5 mg/dL (ref 0.3–1.2)
Total Protein: 7.2 g/dL (ref 6.5–8.1)

## 2015-03-14 LAB — LIPASE, BLOOD: LIPASE: 88 U/L — AB (ref 22–51)

## 2015-03-14 LAB — TROPONIN I: TROPONIN I: 0.09 ng/mL — AB (ref <0.031)

## 2015-03-14 MED ORDER — MORPHINE SULFATE 4 MG/ML IJ SOLN
4.0000 mg | Freq: Once | INTRAMUSCULAR | Status: AC
  Administered 2015-03-14: 4 mg via INTRAVENOUS

## 2015-03-14 MED ORDER — MORPHINE SULFATE 4 MG/ML IJ SOLN: 4.0000 mg | Freq: Once | INTRAMUSCULAR | Status: DC

## 2015-03-14 MED ORDER — ONDANSETRON HCL 4 MG/2ML IJ SOLN
INTRAMUSCULAR | Status: AC
  Administered 2015-03-14: 4 mg via INTRAVENOUS
  Filled 2015-03-14: qty 2

## 2015-03-14 MED ORDER — MORPHINE SULFATE 4 MG/ML IJ SOLN
INTRAMUSCULAR | Status: AC
  Administered 2015-03-14: 4 mg via INTRAVENOUS
  Filled 2015-03-14: qty 1

## 2015-03-14 MED ORDER — SODIUM CHLORIDE 0.9 % IV SOLN
INTRAVENOUS | Status: DC
  Administered 2015-03-14: 1000 mL via INTRAVENOUS

## 2015-03-14 MED ORDER — SODIUM CHLORIDE 0.9 % IV BOLUS (SEPSIS)
1000.0000 mL | INTRAVENOUS | Status: AC
  Administered 2015-03-14: 1000 mL via INTRAVENOUS

## 2015-03-14 MED ORDER — ONDANSETRON HCL 4 MG/2ML IJ SOLN
4.0000 mg | INTRAMUSCULAR | Status: AC
  Administered 2015-03-14: 4 mg via INTRAVENOUS

## 2015-03-14 MED ORDER — IOHEXOL 300 MG/ML  SOLN
25.0000 mL | Freq: Once | INTRAMUSCULAR | Status: AC | PRN
  Administered 2015-03-14: 25 mL via ORAL

## 2015-03-14 MED ORDER — IOHEXOL 300 MG/ML  SOLN
60.0000 mL | Freq: Once | INTRAMUSCULAR | Status: AC | PRN
  Administered 2015-03-14: 60 mL via INTRAVENOUS

## 2015-03-14 NOTE — ED Notes (Signed)
Pt presents to ED with c/o abdominal/epigastric pain since yesterday. Pt denies any fever, denies N/V/D. Pt states the pain is "directly below the breastbone". Pt is A&O, in NAD, with grandson at bedside.

## 2015-03-14 NOTE — ED Provider Notes (Signed)
[facility] Emergency Department Provider Note  ____________________________________________  Time seen: Approximately 8:17 PM  I have reviewed the triage vital signs and the nursing notes.   HISTORY  Chief Complaint Abdominal Pain    HPI [name] is a 81 y.o. male with a history of arterial disease status post carotid endarterectomy, stents in his legs, and at least one coronary artery stent who presents with 2 days of constant epigastric pain.  He states that it started mild and has gradually increased to moderate to severe at this time.  At first he thought it was just gas, but it has gotten worse.  He denies any chest pain, but states that the pain is "directly below the breast bone".  He is gesturing at the very bottom of his sternum/epigastrium.  He denies shortness of breath, nausea, vomiting, lower abdominal pain, diarrhea, and constipation.  The patient states that he has been afraid to eat anything so he does not know if that makes it worse.  He states that the pain is better with ambulating and moving around, worse with holding still.   PMH:  Peripheral arterial disease s/p carotid endarterectomy, two stents in legs, 1 coronary stent; hypertension, hyperlipidemia, seasonal allergies, CAD  There are no active problems to display for this patient.   Surgical hx:  Carotid endarterectomy  Current Outpatient Rx  Name  Route  Sig  Dispense  Refill  . allopurinol (ZYLOPRIM) 100 MG tablet   Oral   Take 100 mg by mouth 2 (two) times daily.         Marland Kitchen aspirin EC 81 MG tablet   Oral   Take 81 mg by mouth daily.         . bumetanide (BUMEX) 0.5 MG tablet   Oral   Take 2 mg by mouth daily.          . bumetanide (BUMEX) 2 MG tablet   Oral   Take 2 mg by mouth daily. 1 or 2 tablets AS NEEDED         . clopidogrel (PLAVIX) 75 MG tablet   Oral   Take 75 mg by mouth daily.         Marland Kitchen gabapentin (NEURONTIN) 300 MG capsule   Oral    Take 300 mg by mouth 3 (three) times daily.         . Garlic 6759 MG CAPS   Oral   Take by mouth daily.         Marland Kitchen glipiZIDE (GLUCOTROL) 5 MG tablet   Oral   Take by mouth 2 (two) times daily before a meal.         . levothyroxine (SYNTHROID, LEVOTHROID) 112 MCG tablet   Oral   Take 112 mcg by mouth daily before breakfast.         . lisinopril (PRINIVIL,ZESTRIL) 10 MG tablet   Oral   Take 10 mg by mouth daily.         . montelukast (SINGULAIR) 10 MG tablet   Oral   Take 10 mg by mouth at bedtime.         . Omega-3 Fatty Acids (FISH OIL) 1000 MG CAPS   Oral   Take by mouth 2 (two) times daily.         . pravastatin (PRAVACHOL) 40 MG tablet   Oral   Take 40 mg by mouth daily.         . ranitidine (ZANTAC) 150 MG tablet  Oral   Take 150 mg by mouth 2 (two) times daily. AS NEEDED         . vitamin A 7500 UNIT capsule   Oral   Take 7,500 Units by mouth daily.         . vitamin B-12 (CYANOCOBALAMIN) 1000 MCG tablet   Intramuscular   Inject 1,000 mcg into the muscle every 30 (thirty) days.           Allergies Penicillins  History reviewed. No pertinent family history.  Social History History  Substance Use Topics  . Smoking status: Never Smoker   . Smokeless tobacco: Not on file  . Alcohol Use: No    Review of Systems Constitutional: No fever/chills Eyes: No visual changes. ENT: No sore throat. Cardiovascular: Denies chest pain. Respiratory: Denies shortness of breath. Gastrointestinal: Epigastric pain and inferior substernal pain.  Nausea, no vomiting.  No diarrhea.  No constipation. Genitourinary: Negative for dysuria. Musculoskeletal: Negative for back pain. Skin: Negative for rash. Neurological: Negative for headaches, focal weakness or numbness.  10-point ROS otherwise negative.  ____________________________________________   PHYSICAL EXAM:  VITAL SIGNS: ED Triage Vitals  Enc Vitals Group     BP 03/14/15 1910 112/66  mmHg     Pulse Rate 03/14/15 1910 88     Resp --      Temp 03/14/15 1910 98.7 F (37.1 C)     Temp Source 03/14/15 1910 Oral     SpO2 03/14/15 1910 96 %     Weight 03/14/15 1910 201 lb (91.173 kg)     Height 03/14/15 1910 6' (1.829 m)     Head Cir --      Peak Flow --      Pain Score 03/14/15 1911 9     Pain Loc --      Pain Edu? --      Excl. in [place]? --     Constitutional: Alert and oriented. Well appearing and in no acute distress.  Appears uncomfortable Eyes: Conjunctivae are normal. PERRL. EOMI. Head: Atraumatic. Nose: No congestion/rhinnorhea. Mouth/Throat: Mucous membranes are moist.  Oropharynx non-erythematous. Neck: No stridor.   Cardiovascular: Normal rate, regular rhythm. Grossly normal heart sounds.  Good peripheral circulation. Respiratory: Normal respiratory effort.  No retractions. Lungs CTAB. Gastrointestinal: Soft with mild tenderness to palpation of the epigastrium. No distention. No abdominal bruits. No CVA tenderness. Musculoskeletal: No lower extremity tenderness nor edema.  No joint effusions. Neurologic:  Normal speech and language. No gross focal neurologic deficits are appreciated. Speech is normal. Skin:  Skin is warm, dry and intact. No rash noted. Psychiatric: Mood and affect are normal. Speech and behavior are normal.  ____________________________________________   LABS (all labs ordered are listed, but only abnormal results are displayed)  Labs Reviewed  CBC WITH DIFFERENTIAL/PLATELET - Abnormal; Notable for the following:    RDW 15.3 (*)    Platelets 145 (*)    Neutro Abs 6.7 (*)    Lymphs Abs 0.7 (*)    All other components within normal limits  COMPREHENSIVE METABOLIC PANEL - Abnormal; Notable for the following:    Chloride 96 (*)    CO2 34 (*)    BUN 23 (*)    Creatinine, Ser 1.72 (*)    ALT 15 (*)    GFR calc non Af Amer 34 (*)    GFR calc Af Amer 39 (*)    All other components within normal limits  LIPASE, BLOOD - Abnormal;  Notable for the following:  Lipase 88 (*)    All other components within normal limits  TROPONIN I - Abnormal; Notable for the following:    Troponin I 0.09 (*)    All other components within normal limits  URINALYSIS COMPLETEWITH MICROSCOPIC ([facility] ONLY) - Abnormal; Notable for the following:    Color, Urine YELLOW (*)    APPearance CLEAR (*)    Hgb urine dipstick 1+ (*)    All other components within normal limits   ____________________________________________  EKG  ED ECG REPORT I, Daleisa Halperin, the attending physician, personally viewed and interpreted this ECG.   Date: 03/14/2015  EKG Time: 19:22  Rate: 75  Rhythm: atrial fibrillation, rate 75  Axis: Left axis deviation  Intervals:right bundle branch block  ST&T Change: Nonspecific ST changes.  When compared to the patient's last EKG on record, there are no significant changes in morphology  ____________________________________________  RADIOLOGY  Ct Abdomen Pelvis W Contrast  03/14/2015   CLINICAL DATA:  Two day history of epigastric pain.  EXAM: CT ABDOMEN AND PELVIS WITH CONTRAST  TECHNIQUE: Multidetector CT imaging of the abdomen and pelvis was performed using the standard protocol following bolus administration of intravenous contrast.  CONTRAST:  60 mL Omnipaque 300 intravenous  COMPARISON:  05/13/2013  FINDINGS: There is a 3.2 x 4.4 x 6.5 cm mass involving the pancreatic tail. The mass contiguously involves the posterior wall of the gastric body and there is stranding opacity tethering the medial aspect of the proximal descending colon. 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 The splenic artery is patent. The superior mesenteric artery is patent, with preservation of surrounding fat. A few small regional nodes are suggested.  The liver, gallbladder and bile ducts appear unremarkable. The pancreatic head is unremarkable. Spleen is normal in size and  unremarkable. The ureters and kidneys are unremarkable.  Abdominal aorta is normal in caliber with moderate atherosclerotic calcification. 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  There is moderate colonic diverticulosis. Small bowel appears normal.  There are multiple noncalcified nodules in the lung bases, measuring up to 3.7 mm. For the most part these appear to be unchanged from 05/13/2013 and they are likely benign.  The pancreatic tail mass likely represents a primary neoplasm. Acute pancreatitis can simulate a neoplasm and is not entirely excluded, but there is a notable absence of surrounding inflammatory change and an absence of fluid around the pancreas, along Gerota's fashion or in the pericolic gutters. 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  IMPRESSION: 1. 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 Acute pancreatitis is less likely but not entirely excluded. 2. The pancreatic tail abnormality directly extends to the posterior wall of the stomach and to the medial wall of proximal descending colon, without obstruction of either of these structures. 3. 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 4. No hepatic metastases are evident. The multiple noncalcified lung base nodules are more likely benign. 5. 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 6. Diverticulosis 7. Abdominal MRI without/with contrast will be helpful in further characterization and definition of the extent of disease.   Electronically Signed   By: Andreas Newport M.D.   On: 03/14/2015 21:47    ____________________________________________   PROCEDURES  Procedure(s) performed: None  Critical Care performed: No ____________________________________________  INITIAL IMPRESSION / ASSESSMENT AND PLAN / ED  COURSE  Pertinent labs & imaging results that were available during my care of the patient were reviewed by me and considered in my medical decision making (see chart for details).  The patient has a troponin that is elevated slightly more than it has been in the past.  He has a mass in the tail of the pancreas which is likely neoplastic.  Given his persistent pain and elevated troponin I will admit him for pain control, serial labs, and GI/oncology consult.  I discussed this plan with the patient has family who understand and agree. ____________________________________________  FINAL CLINICAL IMPRESSION(S) / ED DIAGNOSES  Final diagnoses:  Epigastric pain  Pancreatic neoplasm      NEW MEDICATIONS STARTED DURING THIS VISIT:  New Prescriptions   No medications on file     Hinda Kehr, MD 03/15/15 [phone]

## 2015-03-14 NOTE — ED Notes (Signed)
Pt ambulatory to triage room with no difficulty. Pt reports started about 2 days ago with epigastric pain that does not radiate. Pt denies n/v reports just pain. States thought i8t was gas. Pt also reports hx of pancreatitis.

## 2015-03-15 ENCOUNTER — Encounter: Payer: Self-pay | Admitting: Emergency Medicine

## 2015-03-15 DIAGNOSIS — Z794 Long term (current) use of insulin: Secondary | ICD-10-CM

## 2015-03-15 DIAGNOSIS — Z79899 Other long term (current) drug therapy: Secondary | ICD-10-CM

## 2015-03-15 DIAGNOSIS — K8689 Other specified diseases of pancreas: Secondary | ICD-10-CM | POA: Diagnosis present

## 2015-03-15 DIAGNOSIS — E119 Type 2 diabetes mellitus without complications: Secondary | ICD-10-CM

## 2015-03-15 DIAGNOSIS — K869 Disease of pancreas, unspecified: Secondary | ICD-10-CM

## 2015-03-15 DIAGNOSIS — Z85828 Personal history of other malignant neoplasm of skin: Secondary | ICD-10-CM

## 2015-03-15 DIAGNOSIS — Z8639 Personal history of other endocrine, nutritional and metabolic disease: Secondary | ICD-10-CM

## 2015-03-15 DIAGNOSIS — I10 Essential (primary) hypertension: Secondary | ICD-10-CM

## 2015-03-15 DIAGNOSIS — I739 Peripheral vascular disease, unspecified: Secondary | ICD-10-CM

## 2015-03-15 DIAGNOSIS — Z9049 Acquired absence of other specified parts of digestive tract: Secondary | ICD-10-CM

## 2015-03-15 DIAGNOSIS — E039 Hypothyroidism, unspecified: Secondary | ICD-10-CM

## 2015-03-15 DIAGNOSIS — R109 Unspecified abdominal pain: Secondary | ICD-10-CM

## 2015-03-15 LAB — GLUCOSE, CAPILLARY
Comment 1: 1
Comment 2: 2
Glucose-Capillary: 163 mg/dL — ABNORMAL HIGH (ref 65–99)
Glucose-Capillary: 115 mg/dL — ABNORMAL HIGH (ref 65–99)
Glucose-Capillary: 139 mg/dL — ABNORMAL HIGH (ref 65–99)
Glucose-Capillary: 181 mg/dL — ABNORMAL HIGH (ref 65–99)

## 2015-03-15 LAB — TROPONIN I
Troponin I: 0.08 ng/mL — ABNORMAL HIGH
Troponin I: 0.07 ng/mL — ABNORMAL HIGH (ref <0.031)
Troponin I: 0.06 ng/mL — ABNORMAL HIGH (ref <0.031)

## 2015-03-15 LAB — TSH: TSH: 4.64 u[IU]/mL — ABNORMAL HIGH (ref 0.350–4.500)

## 2015-03-15 LAB — HEMOGLOBIN A1C: Hgb A1c MFr Bld: 8.1 % — ABNORMAL HIGH (ref 4.0–6.0)

## 2015-03-15 LAB — LIPASE, BLOOD: Lipase: 61 U/L — ABNORMAL HIGH (ref 22–51)

## 2015-03-15 MED ORDER — ONDANSETRON HCL 4 MG PO TABS: 4.0000 mg | ORAL_TABLET | Freq: Four times a day (QID) | ORAL | Status: DC | PRN

## 2015-03-15 MED ORDER — HEPARIN SODIUM (PORCINE) 5000 UNIT/ML IJ SOLN
5000.0000 [IU] | Freq: Three times a day (TID) | INTRAMUSCULAR | Status: DC
  Administered 2015-03-15 – 2015-03-16 (×4): 5000 [IU] via SUBCUTANEOUS
  Filled 2015-03-15 (×4): qty 1

## 2015-03-15 MED ORDER — FAMOTIDINE 20 MG PO TABS
20.0000 mg | ORAL_TABLET | Freq: Two times a day (BID) | ORAL | Status: DC
  Administered 2015-03-15 – 2015-03-16 (×3): 20 mg via ORAL
  Filled 2015-03-15 (×3): qty 1

## 2015-03-15 MED ORDER — PRAVASTATIN SODIUM 20 MG PO TABS
40.0000 mg | ORAL_TABLET | Freq: Every day | ORAL | Status: DC
  Administered 2015-03-15: 40 mg via ORAL
  Filled 2015-03-15: qty 2

## 2015-03-15 MED ORDER — INSULIN ASPART 100 UNIT/ML ~~LOC~~ SOLN
0.0000 [IU] | Freq: Three times a day (TID) | SUBCUTANEOUS | Status: DC
  Administered 2015-03-15: 2 [IU] via SUBCUTANEOUS
  Administered 2015-03-15 – 2015-03-16 (×2): 1 [IU] via SUBCUTANEOUS
  Filled 2015-03-15: qty 2
  Filled 2015-03-15: qty 1

## 2015-03-15 MED ORDER — ASPIRIN EC 81 MG PO TBEC
81.0000 mg | DELAYED_RELEASE_TABLET | Freq: Every day | ORAL | Status: DC
  Administered 2015-03-15 – 2015-03-16 (×2): 81 mg via ORAL
  Filled 2015-03-15 (×2): qty 1

## 2015-03-15 MED ORDER — LEVOTHYROXINE SODIUM 112 MCG PO TABS
112.0000 ug | ORAL_TABLET | Freq: Every day | ORAL | Status: DC
  Administered 2015-03-15 – 2015-03-16 (×2): 112 ug via ORAL
  Filled 2015-03-15 (×2): qty 1

## 2015-03-15 MED ORDER — CLOPIDOGREL BISULFATE 75 MG PO TABS
75.0000 mg | ORAL_TABLET | Freq: Every day | ORAL | Status: DC
  Administered 2015-03-15 – 2015-03-16 (×2): 75 mg via ORAL
  Filled 2015-03-15 (×2): qty 1

## 2015-03-15 MED ORDER — DOCUSATE SODIUM 100 MG PO CAPS
100.0000 mg | ORAL_CAPSULE | Freq: Two times a day (BID) | ORAL | Status: DC
  Administered 2015-03-15: 100 mg via ORAL
  Filled 2015-03-15 (×2): qty 1

## 2015-03-15 MED ORDER — ALLOPURINOL 100 MG PO TABS
100.0000 mg | ORAL_TABLET | Freq: Two times a day (BID) | ORAL | Status: DC
  Administered 2015-03-15 – 2015-03-16 (×3): 100 mg via ORAL
  Filled 2015-03-15 (×3): qty 1

## 2015-03-15 MED ORDER — ONDANSETRON HCL 4 MG/2ML IJ SOLN: 4.0000 mg | Freq: Four times a day (QID) | INTRAMUSCULAR | Status: DC | PRN

## 2015-03-15 MED ORDER — GABAPENTIN 300 MG PO CAPS: 300.0000 mg | ORAL_CAPSULE | Freq: Three times a day (TID) | ORAL | Status: DC

## 2015-03-15 MED ORDER — GABAPENTIN 300 MG PO CAPS
900.0000 mg | ORAL_CAPSULE | Freq: Every day | ORAL | Status: DC
  Administered 2015-03-15: 900 mg via ORAL
  Filled 2015-03-15: qty 3

## 2015-03-15 MED ORDER — OMEGA-3-ACID ETHYL ESTERS 1 G PO CAPS
1.0000 g | ORAL_CAPSULE | Freq: Every day | ORAL | Status: DC
  Administered 2015-03-15 – 2015-03-16 (×2): 1 g via ORAL
  Filled 2015-03-15 (×2): qty 1

## 2015-03-15 MED ORDER — SODIUM CHLORIDE 0.9 % IJ SOLN
3.0000 mL | Freq: Two times a day (BID) | INTRAMUSCULAR | Status: DC
Start: 2015-03-15 — End: 2015-03-16
  Administered 2015-03-15 – 2015-03-16 (×3): 3 mL via INTRAVENOUS

## 2015-03-15 MED ORDER — MORPHINE SULFATE 2 MG/ML IJ SOLN
2.0000 mg | INTRAMUSCULAR | Status: DC | PRN
  Administered 2015-03-15: 2 mg via INTRAVENOUS
  Filled 2015-03-15: qty 1

## 2015-03-15 MED ORDER — SODIUM CHLORIDE 0.9 % IV SOLN
INTRAVENOUS | Status: DC
  Administered 2015-03-15: 02:00:00 via INTRAVENOUS

## 2015-03-15 MED ORDER — LISINOPRIL 10 MG PO TABS
10.0000 mg | ORAL_TABLET | Freq: Every day | ORAL | Status: DC
  Administered 2015-03-15 – 2015-03-16 (×2): 10 mg via ORAL
  Filled 2015-03-15 (×2): qty 1

## 2015-03-15 MED ORDER — OXYCODONE-ACETAMINOPHEN 5-325 MG PO TABS
1.0000 | ORAL_TABLET | ORAL | Status: DC | PRN
  Administered 2015-03-15 (×3): 1 via ORAL
  Filled 2015-03-15 (×3): qty 1

## 2015-03-15 MED ORDER — ACETAMINOPHEN 650 MG RE SUPP: 650.0000 mg | Freq: Four times a day (QID) | RECTAL | Status: DC | PRN

## 2015-03-15 MED ORDER — INSULIN ASPART 100 UNIT/ML ~~LOC~~ SOLN
0.0000 [IU] | Freq: Every day | SUBCUTANEOUS | Status: DC
  Filled 2015-03-15: qty 1

## 2015-03-15 MED ORDER — VITAMIN B-12 1000 MCG PO TABS: 1000.0000 ug | ORAL_TABLET | ORAL | Status: DC

## 2015-03-15 MED ORDER — VITAMIN A 7500 UNITS PO CAPS: 7500.0000 [IU] | ORAL_CAPSULE | Freq: Every day | ORAL | Status: DC

## 2015-03-15 MED ORDER — ACETAMINOPHEN 325 MG PO TABS: 650.0000 mg | ORAL_TABLET | Freq: Four times a day (QID) | ORAL | Status: DC | PRN

## 2015-03-15 MED ORDER — BUMETANIDE 1 MG PO TABS
2.0000 mg | ORAL_TABLET | Freq: Every day | ORAL | Status: DC
  Administered 2015-03-15 – 2015-03-16 (×2): 2 mg via ORAL
  Filled 2015-03-15 (×2): qty 2

## 2015-03-15 MED ORDER — MONTELUKAST SODIUM 10 MG PO TABS
10.0000 mg | ORAL_TABLET | Freq: Every day | ORAL | Status: DC
  Administered 2015-03-15: 10 mg via ORAL
  Filled 2015-03-15: qty 1

## 2015-03-15 NOTE — Progress Notes (Signed)
[facility] [place] NAME: [name]    MR#:  [id number]  DATE OF BIRTH:  May 26, 1925  SUBJECTIVE:   Feels better, mild epigastric pain REVIEW OF SYSTEMS:   Review of Systems  Constitutional: Negative for fever, chills and weight loss.  HENT: Negative for ear discharge, ear pain and nosebleeds.   Eyes: Negative for blurred vision, pain and discharge.  Respiratory: Negative for sputum production, shortness of breath, wheezing and stridor.   Cardiovascular: Negative for chest pain, palpitations, orthopnea and PND.  Gastrointestinal: Positive for abdominal pain. Negative for nausea, vomiting and diarrhea.  Genitourinary: Negative for urgency and frequency.  Musculoskeletal: Negative for back pain and joint pain.  Neurological: Negative for sensory change, speech change, focal weakness and weakness.  Psychiatric/Behavioral: Negative for depression. The patient is not nervous/anxious.   All other systems reviewed and are negative.  Tolerating Diet: CLD Tolerating PT: not eval  DRUG ALLERGIES:   Allergies  Allergen Reactions  . Penicillins Rash    VITALS:  Blood pressure 120/61, pulse 54, temperature 97.9 F (36.6 C), temperature source Oral, resp. rate 18, height 6' (1.829 m), weight 89.721 kg (197 lb 12.8 oz), SpO2 94 %.  PHYSICAL EXAMINATION:   Physical Exam  GENERAL:  81 y.o.-year-old patient lying in the bed with no acute distress.  EYES: Pupils equal, round, reactive to light and accommodation. No scleral icterus. Extraocular muscles intact.  HEENT: Head atraumatic, normocephalic. Oropharynx and nasopharynx clear.  NECK:  Supple, no jugular venous distention. No thyroid enlargement, no tenderness.  LUNGS: Normal breath sounds bilaterally, no wheezing, rales, rhonchi. No use of accessory muscles of respiration.  CARDIOVASCULAR: S1, S2 normal. No murmurs, rubs, or gallops.  ABDOMEN: Soft, nontender, nondistended. Bowel  sounds present. No organomegaly or mass.  EXTREMITIES: No cyanosis, clubbing or edema b/l.    NEUROLOGIC: Cranial nerves II through XII are intact. No focal Motor or sensory deficits b/l.   PSYCHIATRIC: The patient is alert and oriented x 3.  SKIN: No obvious rash, lesion, or ulcer.    LABORATORY PANEL:   CBC  Recent Labs Lab 03/14/15 1922  WBC 8.2  HGB 14.9  HCT 46.3  PLT 145*    Chemistries   Recent Labs Lab 03/14/15 1922  NA 140  K 4.0  CL 96*  CO2 34*  GLUCOSE 97  BUN 23*  CREATININE 1.72*  CALCIUM 9.1  AST 25  ALT 15*  ALKPHOS 91  BILITOT 0.5    Cardiac Enzymes  Recent Labs Lab 03/15/15 1041  TROPONINI 0.07*    RADIOLOGY:  Ct Abdomen Pelvis W Contrast  03/14/2015   CLINICAL DATA:  Two day history of epigastric pain.  EXAM: CT ABDOMEN AND PELVIS WITH CONTRAST  TECHNIQUE: Multidetector CT imaging of the abdomen and pelvis was performed using the standard protocol following bolus administration of intravenous contrast.  CONTRAST:  60 mL Omnipaque 300 intravenous  COMPARISON:  05/13/2013  FINDINGS: There is a 3.2 x 4.4 x 6.5 cm mass involving the pancreatic tail. The mass contiguously involves the posterior wall of the gastric body and there is stranding opacity tethering the medial aspect of the proximal descending colon. 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 The splenic artery is patent. The superior mesenteric artery is patent, with preservation of surrounding fat. A few small regional nodes are suggested.  The liver, gallbladder and bile ducts appear unremarkable. The pancreatic head is  unremarkable. Spleen is normal in size and unremarkable. The ureters and kidneys are unremarkable.  Abdominal aorta is normal in caliber with moderate atherosclerotic calcification. 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  There is moderate colonic diverticulosis. Small bowel appears normal.  There are multiple  noncalcified nodules in the lung bases, measuring up to 3.7 mm. For the most part these appear to be unchanged from 05/13/2013 and they are likely benign.  The pancreatic tail mass likely represents a primary neoplasm. Acute pancreatitis can simulate a neoplasm and is not entirely excluded, but there is a notable absence of surrounding inflammatory change and an absence of fluid around the pancreas, along Gerota's fashion or in the pericolic gutters. 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  IMPRESSION: 1. 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 Acute pancreatitis is less likely but not entirely excluded. 2. The pancreatic tail abnormality directly extends to the posterior wall of the stomach and to the medial wall of proximal descending colon, without obstruction of either of these structures. 3. 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 4. No hepatic metastases are evident. The multiple noncalcified lung base nodules are more likely benign. 5. 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 6. Diverticulosis 7. Abdominal MRI without/with contrast will be helpful in further characterization and definition of the extent of disease.   Electronically Signed   By: Andreas Newport M.D.   On: 03/14/2015 21:47     ASSESSMENT AND PLAN:   81 year old Caucasian male admitted pancreatic mass. 1. Pancreatic tail mass:  -We will consult oncology for further guidance. Manage pain. -start CLD -Lipase WNL  2. Coronary artery disease: Stable  He has no shortness of breath or lower extreme edema at this time. Continue Plavix and lisinopril  3. Diabetes mellitus type 2: Sliding scale insulin while hospitalized  4. Hypothyroidism: Continue Synthroid  5. Peripheral artery disease: Stable  6. Gout: Stable. Continue  allopurinol  7. DVT prophylaxis: Heparin  8. GI prophylaxis: None  Case discussed with Care Management/Social Worker. Management plans discussed with the patient, family and they are in agreement.  CODE STATUS: FULL  DVT Prophylaxis: Heparin  TOTAL TIME TAKING CARE OF THIS PATIENT: 35 mins   >50% time spent on counselling and coordination of care  POSSIBLE D/C IN 1-2 DAYS, DEPENDING ON CLINICAL CONDITION.   Gevorg Brum M.D on 03/15/2015 at 11:34 AM  Between 7am to 6pm - Pager - [phone]  After 6pm go to [email or web address] - password [email or web address]  Office  [phone]  CC: Primary care physician; Lelon Huh, MD

## 2015-03-15 NOTE — H&P (Signed)
[name] is an 81 y.o. male.   Chief Complaint: Abdominal pain HPI: The patient presents emergency department complaining of epigastric pain. He denies any other symptoms. In the emergency department the patient underwent CT scanning of the abdomen which revealed a pancreatic mass for which the emergency department staff called for admission.  Past Medical History  Diagnosis Date  . Diabetes mellitus without complication   . Hypertension   . PAD (peripheral artery disease)   . Gout   . Hypothyroidism   . Cancer     Skin cancer    Past Surgical History  Procedure Laterality Date  . Appendectomy    . Carotid endarterectomy Right   . Heart stents    . Bilateral leg stents      Bilateral iliac and right popliteal  . Skin cancer excision      Family History  Problem Relation Age of Onset  . Diabetes Mother   . Cancer Sister     Uterine   Social History:  reports that he has never smoked. He does not have any smokeless tobacco history on file. He reports that he does not drink alcohol. His drug history is not on file.  Allergies:  Allergies  Allergen Reactions  . Penicillins Rash    Medications Prior to Admission  Medication Sig Dispense Refill  . allopurinol (ZYLOPRIM) 100 MG tablet Take 100 mg by mouth 2 (two) times daily.    Marland Kitchen aspirin EC 81 MG tablet Take 81 mg by mouth daily.    . bumetanide (BUMEX) 0.5 MG tablet Take 2 mg by mouth daily.     . bumetanide (BUMEX) 2 MG tablet Take 2 mg by mouth daily. 1 or 2 tablets AS NEEDED    . clopidogrel (PLAVIX) 75 MG tablet Take 75 mg by mouth daily.    Marland Kitchen gabapentin (NEURONTIN) 300 MG capsule Take 900 mg by mouth at bedtime.     . Garlic 3614 MG CAPS Take by mouth daily.    Marland Kitchen glipiZIDE (GLUCOTROL) 5 MG tablet Take by mouth 2 (two) times daily before a meal.    . levothyroxine (SYNTHROID, LEVOTHROID) 112 MCG tablet Take 112 mcg by mouth daily before breakfast.    . lisinopril (PRINIVIL,ZESTRIL) 10 MG tablet Take 10 mg by  mouth daily.    . montelukast (SINGULAIR) 10 MG tablet Take 10 mg by mouth at bedtime.    . Omega-3 Fatty Acids (FISH OIL) 1000 MG CAPS Take by mouth 2 (two) times daily.    . pravastatin (PRAVACHOL) 40 MG tablet Take 40 mg by mouth daily.    . ranitidine (ZANTAC) 150 MG tablet Take 150 mg by mouth 2 (two) times daily. AS NEEDED    . vitamin A 7500 UNIT capsule Take 7,500 Units by mouth daily.    . vitamin B-12 (CYANOCOBALAMIN) 1000 MCG tablet Inject 1,000 mcg into the muscle every 30 (thirty) days.      Results for orders placed or performed during the hospital encounter of 03/14/15 (from the past 48 hour(s))  CBC with Differential     Status: Abnormal   Collection Time: 03/14/15  7:22 PM  Result Value Ref Range   WBC 8.2 3.8 - 10.6 K/uL   RBC 4.76 4.40 - 5.90 MIL/uL   Hemoglobin 14.9 13.0 - 18.0 g/dL   HCT 46.3 40.0 - 52.0 %   MCV 97.3 80.0 - 100.0 fL   MCH 31.4 26.0 - 34.0 pg   MCHC 32.3 32.0 - 36.0  g/dL   RDW 15.3 (H) 11.5 - 14.5 %   Platelets 145 (L) 150 - 440 K/uL   Neutrophils Relative % 82 %   Neutro Abs 6.7 (H) 1.4 - 6.5 K/uL   Lymphocytes Relative 9 %   Lymphs Abs 0.7 (L) 1.0 - 3.6 K/uL   Monocytes Relative 8 %   Monocytes Absolute 0.6 0.2 - 1.0 K/uL   Eosinophils Relative 1 %   Eosinophils Absolute 0.1 0 - 0.7 K/uL   Basophils Relative 0 %   Basophils Absolute 0.0 0 - 0.1 K/uL  Comprehensive metabolic panel     Status: Abnormal   Collection Time: 03/14/15  7:22 PM  Result Value Ref Range   Sodium 140 135 - 145 mmol/L   Potassium 4.0 3.5 - 5.1 mmol/L   Chloride 96 (L) 101 - 111 mmol/L   CO2 34 (H) 22 - 32 mmol/L   Glucose, Bld 97 65 - 99 mg/dL   BUN 23 (H) 6 - 20 mg/dL   Creatinine, Ser 1.72 (H) 0.61 - 1.24 mg/dL   Calcium 9.1 8.9 - 10.3 mg/dL   Total Protein 7.2 6.5 - 8.1 g/dL   Albumin 4.2 3.5 - 5.0 g/dL   AST 25 15 - 41 U/L   ALT 15 (L) 17 - 63 U/L   Alkaline Phosphatase 91 38 - 126 U/L   Total Bilirubin 0.5 0.3 - 1.2 mg/dL   GFR calc non Af Amer 34 (L)  >60 mL/min   GFR calc Af Amer 39 (L) >60 mL/min    Comment: (NOTE) The eGFR has been calculated using the CKD EPI equation. This calculation has not been validated in all clinical situations. eGFR's persistently <60 mL/min signify possible Chronic Kidney Disease.    Anion gap 10 5 - 15  Lipase, blood     Status: Abnormal   Collection Time: 03/14/15  7:22 PM  Result Value Ref Range   Lipase 88 (H) 22 - 51 U/L  Troponin I  (only if pt is 79 y.o. or older and pain is above umbilicus)     Status: Abnormal   Collection Time: 03/14/15  7:22 PM  Result Value Ref Range   Troponin I 0.09 (H) <0.031 ng/mL    Comment: READ BACK AND VERIFIED WITH JEAN Martinique AT 2006 03/14/15.PMH        PERSISTENTLY INCREASED TROPONIN VALUES IN THE RANGE OF 0.04-0.49 ng/mL CAN BE SEEN IN:       -UNSTABLE ANGINA       -CONGESTIVE HEART FAILURE       -MYOCARDITIS       -CHEST TRAUMA       -ARRYHTHMIAS       -LATE PRESENTING MYOCARDIAL INFARCTION       -COPD   CLINICAL FOLLOW-UP RECOMMENDED.   Urinalysis complete, with microscopic ([facility] only)     Status: Abnormal   Collection Time: 03/14/15 10:54 PM  Result Value Ref Range   Color, Urine YELLOW (A) YELLOW   APPearance CLEAR (A) CLEAR   Glucose, UA NEGATIVE NEGATIVE mg/dL   Bilirubin Urine NEGATIVE NEGATIVE   Ketones, ur NEGATIVE NEGATIVE mg/dL   Specific Gravity, Urine 1.027 1.005 - 1.030   Hgb urine dipstick 1+ (A) NEGATIVE   pH 7.0 5.0 - 8.0   Protein, ur NEGATIVE NEGATIVE mg/dL   Nitrite NEGATIVE NEGATIVE   Leukocytes, UA NEGATIVE NEGATIVE   RBC / HPF 0-5 0 - 5 RBC/hpf   WBC, UA 0-5 0 - 5 WBC/hpf  Bacteria, UA NONE SEEN NONE SEEN   Squamous Epithelial / LPF NONE SEEN NONE SEEN   Ct Abdomen Pelvis W Contrast  03/14/2015   CLINICAL DATA:  Two day history of epigastric pain.  EXAM: CT ABDOMEN AND PELVIS WITH CONTRAST  TECHNIQUE: Multidetector CT imaging of the abdomen and pelvis was performed using the standard protocol following bolus administration  of intravenous contrast.  CONTRAST:  60 mL Omnipaque 300 intravenous  COMPARISON:  05/13/2013  FINDINGS: There is a 3.2 x 4.4 x 6.5 cm mass involving the pancreatic tail. The mass contiguously involves the posterior wall of the gastric body and there is stranding opacity tethering the medial aspect of the proximal descending colon. 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 The splenic artery is patent. The superior mesenteric artery is patent, with preservation of surrounding fat. A few small regional nodes are suggested.  The liver, gallbladder and bile ducts appear unremarkable. The pancreatic head is unremarkable. Spleen is normal in size and unremarkable. The ureters and kidneys are unremarkable.  Abdominal aorta is normal in caliber with moderate atherosclerotic calcification. 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  There is moderate colonic diverticulosis. Small bowel appears normal.  There are multiple noncalcified nodules in the lung bases, measuring up to 3.7 mm. For the most part these appear to be unchanged from 05/13/2013 and they are likely benign.  The pancreatic tail mass likely represents a primary neoplasm. Acute pancreatitis can simulate a neoplasm and is not entirely excluded, but there is a notable absence of surrounding inflammatory change and an absence of fluid around the pancreas, along Gerota's fashion or in the pericolic gutters. 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  IMPRESSION: 1. 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 Acute pancreatitis is less likely but not entirely excluded. 2. The pancreatic tail abnormality directly extends to the posterior wall of the stomach and to the medial wall of proximal descending colon, without obstruction of either of these structures. 3. There is occlusion of the splenic  vein due to the soft tissue mass, with venous collaterals around the spleen and stomach. 4. No hepatic metastases are evident. The multiple noncalcified lung base nodules are more likely benign. 5. 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 6. Diverticulosis 7. Abdominal MRI without/with contrast will be helpful in further characterization and definition of the extent of disease.   Electronically Signed   By: Andreas Newport M.D.   On: 03/14/2015 21:47    Review of Systems  Constitutional: Negative for fever and chills.  HENT: Negative for sore throat and tinnitus.   Eyes: Negative for blurred vision and redness.  Respiratory: Negative for cough and shortness of breath.   Cardiovascular: Negative for chest pain, palpitations, orthopnea and PND.  Gastrointestinal: Positive for abdominal pain. Negative for nausea, vomiting and diarrhea.  Genitourinary: Negative for dysuria, urgency and frequency.  Musculoskeletal: Negative for myalgias and joint pain.  Skin: Negative for rash.       No lesions  Neurological: Negative for speech change, focal weakness and weakness.  Endo/Heme/Allergies: Does not bruise/bleed easily.       No temperature intolerance  Psychiatric/Behavioral: Negative for depression and suicidal ideas.    Blood pressure 125/60, pulse 68, temperature 98.2 F (36.8 C), temperature source Oral, resp. rate 18, height 6' (1.829 m), weight 89.721 kg (197 lb 12.8  oz), SpO2 96 %. Physical Exam  Nursing note and vitals reviewed. Constitutional: He is oriented to person, place, and time. He appears well-developed and well-nourished. No distress.  HENT:  Head: Normocephalic and atraumatic.  Mouth/Throat: Oropharynx is clear and moist.  Eyes: Conjunctivae and EOM are normal. Pupils are equal, round, and reactive to light. No scleral icterus.  Neck: Normal range of motion. Neck supple. No JVD present. No tracheal deviation present. No thyromegaly present.  Cardiovascular:  Normal rate, normal heart sounds and intact distal pulses.  An irregularly irregular rhythm present. Exam reveals no gallop and no friction rub.   No murmur heard. Respiratory: Effort normal and breath sounds normal. No respiratory distress.  GI: Soft. Bowel sounds are normal. He exhibits no distension and no mass. There is tenderness. There is no rebound and no guarding.  Genitourinary:  Deferred  Musculoskeletal: Normal range of motion. He exhibits no edema.  Lymphadenopathy:    He has no cervical adenopathy.  Neurological: He is alert and oriented to person, place, and time. No cranial nerve deficit.  Skin: Skin is warm and dry.  Psychiatric: He has a normal mood and affect. His behavior is normal. Judgment and thought content normal.     Assessment/Plan 81 year old Caucasian male admitted pancreatic mass. 1. Pancreatic mass: We will consult oncology for further guidance. Manage pain. 2. Coronary artery disease: Stable (the patient has Bumex ordered as needed; I do not have echocardiogram results at this time to clarify the nature of heart failure). He has no shortness of breath or lower extreme edema at this time. Continue Plavix and lisinopril 3. Diabetes mellitus type 2: Sliding scale insulin while hospitalized 4. Hypothyroidism: Continue Synthroid 5. Peripheral artery disease: Stable 6. Gout: Stable. Continue allopurinol 7. DVT prophylaxis: Heparin 8. GI prophylaxis: None The patient is a full code. Time spent on admission orders and patient care approximately 45 minutes  Harrie Foreman 03/15/2015, 2:26 AM

## 2015-03-15 NOTE — Consult Note (Signed)
[facility]  [phone] [phone]) [phone]  ID: [name] OB: [birth year]  MR#: [id number]  LXB#:[id number]  [name], MD as PCP - General (Family Medicine)  CHIEF COMPLAINT:  Chief Complaint  Patient presents with  . Abdominal Pain    INTERVAL HISTORY: Patient is an 81 year old male who presented to the emergency room with increasing abdominal pain. He had no other symptoms. Subsequent CT scan revealed a mass in the pancreatic tail highly suspicious for underlying malignancy. His pain is well-controlled with his current narcotic regimen. He has no neurologic complaints. He denies any weight loss. He denies any recent fevers. He has a good appetite and denies any nausea, vomiting, constipation, or diarrhea. He has no changes in his bowel movements and denies any melanotic or hematochezia. He has no urinary complaints. Patient otherwise feels well and offers no further specific complaints.   REVIEW OF SYSTEMS:   Review of Systems  Constitutional: Negative.   Respiratory: Negative.   Cardiovascular: Negative.   Gastrointestinal: Positive for abdominal pain. Negative for nausea, vomiting, diarrhea, constipation, blood in stool and melena.    As per HPI. Otherwise, a complete review of systems is negatve.  PAST MEDICAL HISTORY: Past Medical History  Diagnosis Date  . Diabetes mellitus without complication   . Hypertension   . PAD (peripheral artery disease)   . Gout   . Hypothyroidism   . Cancer     Skin cancer    PAST SURGICAL HISTORY: Past Surgical History  Procedure Laterality Date  . Appendectomy    . Carotid endarterectomy Right   . Heart stents    . Bilateral leg stents      Bilateral iliac and right popliteal  . Skin cancer excision      FAMILY HISTORY Family History  Problem Relation Age of Onset  . Diabetes Mother   . Cancer Sister     Uterine       ADVANCED DIRECTIVES:    HEALTH  MAINTENANCE: History  Substance Use Topics  . Smoking status: Never Smoker   . Smokeless tobacco: Not on file  . Alcohol Use: No     Colonoscopy:  PAP:  Bone density:  Lipid panel:  Allergies  Allergen Reactions  . Penicillins Rash    Current Facility-Administered Medications  Medication Dose Route Frequency Provider Last Rate Last Dose  . acetaminophen (TYLENOL) tablet 650 mg  650 mg Oral Q6H PRN Harrie Foreman, MD       Or  . acetaminophen (TYLENOL) suppository 650 mg  650 mg Rectal Q6H PRN Harrie Foreman, MD      . allopurinol (ZYLOPRIM) tablet 100 mg  100 mg Oral BID Harrie Foreman, MD   100 mg at 03/15/15 0949  . aspirin EC tablet 81 mg  81 mg Oral Daily Harrie Foreman, MD   81 mg at 03/15/15 0950  . bumetanide (BUMEX) tablet 2 mg  2 mg Oral Daily Harrie Foreman, MD   2 mg at 03/15/15 0949  . clopidogrel (PLAVIX) tablet 75 mg  75 mg Oral Daily Harrie Foreman, MD   75 mg at 03/15/15 0949  . docusate sodium (COLACE) capsule 100 mg  100 mg Oral BID Harrie Foreman, MD   100 mg at 03/15/15 0949  . famotidine (PEPCID) tablet 20 mg  20 mg Oral BID Harrie Foreman, MD   20 mg at 03/15/15 0948  . gabapentin (NEURONTIN) capsule 900 mg  900 mg Oral QHS Fritzi Mandes, MD      . heparin injection 5,000 Units  5,000 Units Subcutaneous 3 times per day Harrie Foreman, MD   5,000 Units at 03/15/15 1327  . insulin aspart (novoLOG) injection 0-5 Units  0-5 Units Subcutaneous QHS Harrie Foreman, MD      . insulin aspart (novoLOG) injection 0-9 Units  0-9 Units Subcutaneous TID WC Harrie Foreman, MD   2 Units at 03/15/15 [phone]  . levothyroxine (SYNTHROID, LEVOTHROID) tablet 112 mcg  112 mcg Oral QAC breakfast Harrie Foreman, MD   112 mcg at 03/15/15 0541  . lisinopril (PRINIVIL,ZESTRIL) tablet 10 mg  10 mg Oral Daily Harrie Foreman, MD   10 mg at 03/15/15 0948  . montelukast (SINGULAIR) tablet 10 mg  10 mg Oral QHS Harrie Foreman, MD      . morphine 2 MG/ML  injection 2 mg  2 mg Intravenous Q4H PRN Harrie Foreman, MD   2 mg at 03/15/15 0239  . morphine 4 MG/ML injection 4 mg  4 mg Intravenous Once Hinda Kehr, MD   4 mg at 03/15/15 0228  . omega-3 acid ethyl esters (LOVAZA) capsule 1 g  1 g Oral Daily Harrie Foreman, MD   1 g at 03/15/15 [phone]  . ondansetron (ZOFRAN) tablet 4 mg  4 mg Oral Q6H PRN Harrie Foreman, MD       Or  . ondansetron [facility]) injection 4 mg  4 mg Intravenous Q6H PRN Harrie Foreman, MD      . oxyCODONE-acetaminophen (PERCOCET/ROXICET) 5-325 MG per tablet 1 tablet  1 tablet Oral Q4H PRN Fritzi Mandes, MD   1 tablet at 03/15/15 1507  . pravastatin (PRAVACHOL) tablet 40 mg  40 mg Oral Daily Harrie Foreman, MD   40 mg at 03/15/15 0949  . sodium chloride 0.9 % injection 3 mL  3 mL Intravenous Q12H Harrie Foreman, MD   3 mL at 03/15/15 0950  . vitamin B-12 (CYANOCOBALAMIN) tablet 1,000 mcg  1,000 mcg Oral Q30 days Harrie Foreman, MD   1,000 mcg at 03/15/15 0230    OBJECTIVE: Filed Vitals:   03/15/15 1128  BP:   Pulse: 54  Temp:   Resp:      Body mass index is 26.82 kg/(m^2).    ECOG FS:0 - Asymptomatic  General: Well-developed, well-nourished, no acute distress. Eyes: Pink conjunctiva, anicteric sclera. HEENT: Normocephalic, moist mucous membranes, clear oropharnyx. Lungs: Clear to auscultation bilaterally. Heart: Regular rate and rhythm. No rubs, murmurs, or gallops. Abdomen: Soft, nontender, nondistended. No organomegaly noted, normoactive bowel sounds. Musculoskeletal: No edema, cyanosis, or clubbing. Neuro: Alert, answering all questions appropriately. Cranial nerves grossly intact. Skin: No rashes or petechiae noted. Psych: Normal affect. Lymphatics: No cervical, calvicular, axillary or inguinal LAD.   LAB RESULTS:  Lab Results  Component Value Date   NA 140 03/14/2015   K 4.0 03/14/2015   CL 96* 03/14/2015   CO2 34* 03/14/2015   GLUCOSE 97 03/14/2015   BUN 23* 03/14/2015   CREATININE  1.72* 03/14/2015   CALCIUM 9.1 03/14/2015   PROT 7.2 03/14/2015   ALBUMIN 4.2 03/14/2015   AST 25 03/14/2015   ALT 15* 03/14/2015   ALKPHOS 91 03/14/2015   BILITOT 0.5 03/14/2015   GFRNONAA 34* 03/14/2015   GFRAA 39* 03/14/2015    Lab Results  Component Value Date   WBC 8.2 03/14/2015   NEUTROABS 6.7* 03/14/2015   HGB 14.9  03/14/2015   HCT 46.3 03/14/2015   MCV 97.3 03/14/2015   PLT 145* 03/14/2015     STUDIES: Ct Abdomen Pelvis W Contrast  03/14/2015   CLINICAL DATA:  Two day history of epigastric pain.  EXAM: CT ABDOMEN AND PELVIS WITH CONTRAST  TECHNIQUE: Multidetector CT imaging of the abdomen and pelvis was performed using the standard protocol following bolus administration of intravenous contrast.  CONTRAST:  60 mL Omnipaque 300 intravenous  COMPARISON:  05/13/2013  FINDINGS: There is a 3.2 x 4.4 x 6.5 cm mass involving the pancreatic tail. The mass contiguously involves the posterior wall of the gastric body and there is stranding opacity tethering the medial aspect of the proximal descending colon. 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 The splenic artery is patent. The superior mesenteric artery is patent, with preservation of surrounding fat. A few small regional nodes are suggested.  The liver, gallbladder and bile ducts appear unremarkable. The pancreatic head is unremarkable. Spleen is normal in size and unremarkable. The ureters and kidneys are unremarkable.  Abdominal aorta is normal in caliber with moderate atherosclerotic calcification. 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  There is moderate colonic diverticulosis. Small bowel appears normal.  There are multiple noncalcified nodules in the lung bases, measuring up to 3.7 mm. For the most part these appear to be unchanged from 05/13/2013 and they are likely benign.  The pancreatic tail mass likely represents a primary neoplasm. Acute pancreatitis can  simulate a neoplasm and is not entirely excluded, but there is a notable absence of surrounding inflammatory change and an absence of fluid around the pancreas, along Gerota's fashion or in the pericolic gutters. 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  IMPRESSION: 1. 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 Acute pancreatitis is less likely but not entirely excluded. 2. The pancreatic tail abnormality directly extends to the posterior wall of the stomach and to the medial wall of proximal descending colon, without obstruction of either of these structures. 3. 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 4. No hepatic metastases are evident. The multiple noncalcified lung base nodules are more likely benign. 5. 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 6. Diverticulosis 7. Abdominal MRI without/with contrast will be helpful in further characterization and definition of the extent of disease.   Electronically Signed   By: Andreas Newport M.D.   On: 03/14/2015 21:47    ASSESSMENT: Pancreatic tail mass highly suspicious for malignancy.  PLAN:    1. Pancreatic mass: CT scan results as above reviewed independently. CA-19-9 has been ordered and is currently pending. Ultimately, patient will require biopsy, likely with EUS, to determine if this is malignancy or not. This will be arranged as an outpatient. Patient will also require a PET scan for further staging workup. No intervention needed at this time. Okay to discharge from an oncology standpoint and follow-up as an outpatient for further evaluation and continuation of his workup for the pancreatic mass.  Appreciate consult, call with questions.   Lloyd Huger, MD   03/15/2015 4:24 PM

## 2015-03-16 LAB — CEA: CEA: 7.4 ng/mL — ABNORMAL HIGH (ref 0.0–4.7)

## 2015-03-16 LAB — CANCER ANTIGEN 19-9: CA 19-9: 112 U/mL — ABNORMAL HIGH (ref 0–35)

## 2015-03-16 MED ORDER — OXYCODONE-ACETAMINOPHEN 5-325 MG PO TABS: 1.0000 | ORAL_TABLET | Freq: Three times a day (TID) | ORAL | Status: DC | PRN

## 2015-03-16 NOTE — Discharge Summary (Signed)
[facility] NAME: Frisco Cordts    MR#:  [id number]  DATE OF BIRTH:  05-03-1925  DATE OF ADMISSION:  03/14/2015 ADMITTING PHYSICIAN: Harrie Foreman, MD  DATE OF DISCHARGE: 03/16/15  PRIMARY CARE PHYSICIAN: Lelon Huh, MD    ADMISSION DIAGNOSIS:  Epigastric pain [R10.13] Pancreatic neoplasm [D49.0]  DISCHARGE DIAGNOSIS:  Pancreatic tail mass   SECONDARY DIAGNOSIS:   Past Medical History  Diagnosis Date  . Diabetes mellitus without complication   . Hypertension   . PAD (peripheral artery disease)   . Gout   . Hypothyroidism   . Cancer     Skin cancer    HOSPITAL COURSE:   81 year old Caucasian male with h/o HTN, DM-2 comes in the ER with epigastric abdominal pain and was found to have...  1. Pancreatic tail mass:  -Appreciate Dr Grayland Ormond' input. W/u as out pt with him. Pt agreeable -Lipase WNL  2. Coronary artery disease: Stable He has no shortness of breath or lower extreme edema at this time.  Continue Plavix and lisinopril  3. Diabetes mellitus type 2:  -resumed home meds at d/c  4. Hypothyroidism: Continue Synthroid  5. Peripheral artery disease: Stable  6. Gout: Stable. Continue allopurinol  7. DVT prophylaxis: Heparin  Overall feels well. D/c home with outpt Oncology f/u  DISCHARGE CONDITIONS:  Fair  CONSULTS OBTAINED:  Treatment Team:  Lloyd Huger, MD  DRUG ALLERGIES:   Allergies  Allergen Reactions  . Penicillins Rash    DISCHARGE MEDICATIONS:   Current Discharge Medication List    CONTINUE these medications which have NOT CHANGED   Details  allopurinol (ZYLOPRIM) 100 MG tablet Take 100 mg by mouth 2 (two) times daily.    aspirin EC 81 MG tablet Take 81 mg by mouth daily.    !! bumetanide (BUMEX) 0.5 MG tablet Take 2 mg by mouth daily.     !! bumetanide (BUMEX) 2 MG tablet Take 2 mg by mouth daily. 1 or 2 tablets AS NEEDED    clopidogrel (PLAVIX) 75 MG  tablet Take 75 mg by mouth daily.    gabapentin (NEURONTIN) 300 MG capsule Take 900 mg by mouth at bedtime.     Garlic 7425 MG CAPS Take by mouth daily.    glipiZIDE (GLUCOTROL) 5 MG tablet Take by mouth 2 (two) times daily before a meal.    levothyroxine (SYNTHROID, LEVOTHROID) 112 MCG tablet Take 112 mcg by mouth daily before breakfast.    lisinopril (PRINIVIL,ZESTRIL) 10 MG tablet Take 10 mg by mouth daily.    montelukast (SINGULAIR) 10 MG tablet Take 10 mg by mouth at bedtime.    Omega-3 Fatty Acids (FISH OIL) 1000 MG CAPS Take by mouth 2 (two) times daily.    pravastatin (PRAVACHOL) 40 MG tablet Take 40 mg by mouth daily.    ranitidine (ZANTAC) 150 MG tablet Take 150 mg by mouth 2 (two) times daily. AS NEEDED    vitamin A 7500 UNIT capsule Take 7,500 Units by mouth daily.    vitamin B-12 (CYANOCOBALAMIN) 1000 MCG tablet Inject 1,000 mcg into the muscle every 30 (thirty) days.     !! - Potential duplicate medications found. Please discuss with provider.      If you experience worsening of your admission symptoms, develop shortness of breath, life threatening emergency, suicidal or homicidal thoughts you must seek medical attention immediately by calling 911 or calling your MD immediately  if symptoms less severe.  You  Must read complete instructions/literature along with all the possible adverse reactions/side effects for all the Medicines you take and that have been prescribed to you. Take any new Medicines after you have completely understood and accept all the possible adverse reactions/side effects.   Please note  You were cared for by a hospitalist during your hospital stay. If you have any questions about your discharge medications or the care you received while you were in the hospital after you are discharged, you can call the unit and asked to speak with the hospitalist on call if the hospitalist that took care of you is not available. Once you are discharged, your  primary care physician will handle any further medical issues. Please note that NO REFILLS for any discharge medications will be authorized once you are discharged, as it is imperative that you return to your primary care physician (or establish a relationship with a primary care physician if you do not have one) for your aftercare needs so that they can reassess your need for medications and monitor your lab values. Today   SUBJECTIVE   Doing well. tolerating diet  VITAL SIGNS:  Blood pressure 121/59, pulse 45, temperature 97.6 F (36.4 C), temperature source Oral, resp. rate 18, height 6' (1.829 m), weight 89.994 kg (198 lb 6.4 oz), SpO2 97 %.  I/O:   Intake/Output Summary (Last 24 hours) at 03/16/15 0813 Last data filed at 03/16/15 0511  Gross per 24 hour  Intake   1560 ml  Output   1950 ml  Net   -390 ml    PHYSICAL EXAMINATION:  GENERAL:  81 y.o.-year-old patient lying in the bed with no acute distress.  EYES: Pupils equal, round, reactive to light and accommodation. No scleral icterus. Extraocular muscles intact.  HEENT: Head atraumatic, normocephalic. Oropharynx and nasopharynx clear.  NECK:  Supple, no jugular venous distention. No thyroid enlargement, no tenderness.  LUNGS: Normal breath sounds bilaterally, no wheezing, rales,rhonchi or crepitation. No use of accessory muscles of respiration.  CARDIOVASCULAR: S1, S2 normal. No murmurs, rubs, or gallops.  ABDOMEN: Soft, non-tender, non-distended. Bowel sounds present. No organomegaly or mass.  EXTREMITIES: No pedal edema, cyanosis, or clubbing.  NEUROLOGIC: Cranial nerves II through XII are intact. Muscle strength 5/5 in all extremities. Sensation intact. Gait not checked.  PSYCHIATRIC: The patient is alert and oriented x 3.  SKIN: No obvious rash, lesion, or ulcer.   DATA REVIEW:   CBC   Recent Labs Lab 03/14/15 1922  WBC 8.2  HGB 14.9  HCT 46.3  PLT 145*    Chemistries   Recent Labs Lab 03/14/15 1922  NA  140  K 4.0  CL 96*  CO2 34*  GLUCOSE 97  BUN 23*  CREATININE 1.72*  CALCIUM 9.1  AST 25  ALT 15*  ALKPHOS 91  BILITOT 0.5    Microbiology Results   No results found for this or any previous visit (from the past 240 hour(s)).  RADIOLOGY:  Ct Abdomen Pelvis W Contrast  03/14/2015   CLINICAL DATA:  Two day history of epigastric pain.  EXAM: CT ABDOMEN AND PELVIS WITH CONTRAST  TECHNIQUE: Multidetector CT imaging of the abdomen and pelvis was performed using the standard protocol following bolus administration of intravenous contrast.  CONTRAST:  60 mL Omnipaque 300 intravenous  COMPARISON:  05/13/2013  FINDINGS: There is a 3.2 x 4.4 x 6.5 cm mass involving the pancreatic tail. The mass contiguously involves the posterior wall of the gastric body and there is stranding  opacity tethering the medial aspect of the proximal descending colon. 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 The splenic artery is patent. The superior mesenteric artery is patent, with preservation of surrounding fat. A few small regional nodes are suggested.  The liver, gallbladder and bile ducts appear unremarkable. The pancreatic head is unremarkable. Spleen is normal in size and unremarkable. The ureters and kidneys are unremarkable.  Abdominal aorta is normal in caliber with moderate atherosclerotic calcification. 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  There is moderate colonic diverticulosis. Small bowel appears normal.  There are multiple noncalcified nodules in the lung bases, measuring up to 3.7 mm. For the most part these appear to be unchanged from 05/13/2013 and they are likely benign.  The pancreatic tail mass likely represents a primary neoplasm. Acute pancreatitis can simulate a neoplasm and is not entirely excluded, but there is a notable absence of surrounding inflammatory change and an absence of fluid around the pancreas, along Gerota's fashion  or in the pericolic gutters. 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  IMPRESSION: 1. 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 Acute pancreatitis is less likely but not entirely excluded. 2. The pancreatic tail abnormality directly extends to the posterior wall of the stomach and to the medial wall of proximal descending colon, without obstruction of either of these structures. 3. 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 4. No hepatic metastases are evident. The multiple noncalcified lung base nodules are more likely benign. 5. 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 6. Diverticulosis 7. Abdominal MRI without/with contrast will be helpful in further characterization and definition of the extent of disease.   Electronically Signed   By: Andreas Newport M.D.   On: 03/14/2015 21:47     Management plans discussed with the patient, family and they are in agreement.  CODE STATUS:     Code Status Orders        Start     Ordered   03/15/15 0202  Full code   Continuous     03/15/15 0201    Advance Directive Documentation        Most Recent Value   Type of Advance Directive  Living will   Pre-existing out of facility DNR order (yellow form or pink MOST form)     "MOST" Form in Place?        TOTAL TIME TAKING CARE OF THIS PATIENT: 40 minutes.    Monnie Anspach M.D on 03/16/2015 at 8:13 AM  Between 7am to 6pm - Pager - [phone] After 6pm go to [email or web address] - password [email or web address]  Office  [phone]  CC: Primary care physician; Lelon Huh, MD

## 2015-03-18 ENCOUNTER — Other Ambulatory Visit: Payer: Self-pay | Admitting: Oncology

## 2015-03-18 ENCOUNTER — Telehealth: Payer: Self-pay | Admitting: *Deleted

## 2015-03-18 DIAGNOSIS — K8689 Other specified diseases of pancreas: Secondary | ICD-10-CM

## 2015-03-18 NOTE — Telephone Encounter (Signed)
I spoke with [name] this afternoon and he has forward patient information to [facility]. Patient will have EUS set up once records have been reviewed.

## 2015-03-18 NOTE — Telephone Encounter (Signed)
Patient informed of impending schedule of appt

## 2015-03-19 ENCOUNTER — Other Ambulatory Visit: Payer: Self-pay | Admitting: *Deleted

## 2015-03-19 MED ORDER — OXYCODONE-ACETAMINOPHEN 5-325 MG PO TABS: 1.0000 | ORAL_TABLET | Freq: Three times a day (TID) | ORAL | Status: DC | PRN

## 2015-03-19 NOTE — Telephone Encounter (Signed)
Pain medication given on 03/16/15, looks like should be about an 8 day supply. We can refill for patient and leave for pick-up on Friday.

## 2015-03-19 NOTE — Telephone Encounter (Signed)
Notified he can pick up rx on Friday

## 2015-03-21 ENCOUNTER — Other Ambulatory Visit: Payer: Self-pay | Admitting: *Deleted

## 2015-03-21 MED ORDER — OXYCODONE-ACETAMINOPHEN 5-325 MG PO TABS: 1.0000 | ORAL_TABLET | Freq: Three times a day (TID) | ORAL | Status: DC | PRN

## 2015-03-21 NOTE — Telephone Encounter (Signed)
Patient is requesting a refill for oxycodone. Medication was prescribed for him in hospital 03/19/15. Patient is almost out and wanted to know if you could fill med. Patient is scheduled for ov 04/23/15.

## 2015-03-26 ENCOUNTER — Encounter: Payer: Medicare Other | Attending: Surgery | Admitting: Surgery

## 2015-03-26 DIAGNOSIS — E1122 Type 2 diabetes mellitus with diabetic chronic kidney disease: Secondary | ICD-10-CM | POA: Diagnosis not present

## 2015-03-26 DIAGNOSIS — E1143 Type 2 diabetes mellitus with diabetic autonomic (poly)neuropathy: Secondary | ICD-10-CM | POA: Diagnosis not present

## 2015-03-26 DIAGNOSIS — I482 Chronic atrial fibrillation: Secondary | ICD-10-CM | POA: Diagnosis not present

## 2015-03-26 DIAGNOSIS — I70632 Atherosclerosis of nonbiological bypass graft(s) of the right leg with ulceration of calf: Secondary | ICD-10-CM | POA: Diagnosis not present

## 2015-03-26 DIAGNOSIS — N189 Chronic kidney disease, unspecified: Secondary | ICD-10-CM | POA: Diagnosis not present

## 2015-03-26 DIAGNOSIS — R600 Localized edema: Secondary | ICD-10-CM | POA: Diagnosis not present

## 2015-03-26 DIAGNOSIS — E11622 Type 2 diabetes mellitus with other skin ulcer: Secondary | ICD-10-CM | POA: Insufficient documentation

## 2015-03-26 DIAGNOSIS — L97211 Non-pressure chronic ulcer of right calf limited to breakdown of skin: Secondary | ICD-10-CM | POA: Insufficient documentation

## 2015-03-26 DIAGNOSIS — I771 Stricture of artery: Secondary | ICD-10-CM | POA: Insufficient documentation

## 2015-03-27 ENCOUNTER — Ambulatory Visit: Admission: RE | Admit: 2015-03-27 | Payer: Medicare Other | Source: Ambulatory Visit

## 2015-03-28 NOTE — Progress Notes (Signed)
MAHAD, NEWSTROM ([id number]) Visit Report for 03/26/2015 Chief Complaint Document Details Patient Name: [name], [name]. Date of Service: 03/26/2015 1:45 PM Medical Record Number: [id number] Patient Account Number: [id number] Date of Birth/Sex: Jan 27, 1925 (81 y.o. Male) Treating RN: Baruch Gouty, RN, BSN, Velva Harman Primary Care Physician: Lelon Huh Other Clinician: Referring Physician: Lelon Huh Treating Physician/Extender: BURNS III, Charlean Sanfilippo in Treatment: 8 Information Obtained from: Patient Chief Complaint Right calf ulcer. Arterial insufficiency. Electronic Signature(s) Signed: 03/26/2015 3:34:44 PM By: Loletha Grayer MD Entered By: Loletha Grayer on 03/26/2015 14:49:36 [place] ([id number]) -------------------------------------------------------------------------------- Debridement Details Patient Name: [name]. Date of Service: 03/26/2015 1:45 PM Medical Record Number: [id number] Patient Account Number: [id number] Date of Birth/Sex: August 01, 1925 (81 y.o. Male) Treating RN: Baruch Gouty, RN, BSN, [place] Primary Care Physician: Lelon Huh Other Clinician: Referring Physician: Lelon Huh Treating Physician/Extender: BURNS III, Charlean Sanfilippo in Treatment: 8 Debridement Performed for Wound #1 Right,Medial Lower Leg Assessment: Performed By: Physician BURNS III, Teressa Senter., MD Debridement: Debridement Pre-procedure Yes Verification/Time Out Taken: Start Time: 14:09 Pain Control: Lidocaine 4% Topical Solution Level: Skin/Subcutaneous Tissue Total Area Debrided (L x 0.5 (cm) x 1.1 (cm) = 0.55 (cm) W): Tissue and other Viable, Non-Viable, Fat, Fibrin/Slough, Subcutaneous material debrided: Instrument: Curette Bleeding: Minimum Hemostasis Achieved: Pressure End Time: 14:09 Procedural Pain: 0 Post Procedural Pain: 0 Response to Treatment: Procedure was tolerated well Post Debridement Measurements of Total Wound Length: (cm) 0.5 Width: (cm)  1.1 Depth: (cm) 0.2 Volume: (cm) 0.086 Electronic Signature(s) Signed: 03/26/2015 3:34:44 PM By: Loletha Grayer MD Signed: 03/27/2015 5:42:40 PM By: Regan Lemming BSN, RN Previous Signature: 03/26/2015 2:11:34 PM Version By: Regan Lemming BSN, RN Entered By: Loletha Grayer on 03/26/2015 14:49:25 [place], [name] ([id number]) -------------------------------------------------------------------------------- HPI Details Patient Name: [name]. Date of Service: 03/26/2015 1:45 PM Medical Record Number: [id number] Patient Account Number: [id number] Date of Birth/Sex: [birth year] (81 y.o. Male) Treating RN: Baruch Gouty, RN, BSN, Velva Harman Primary Care Physician: Lelon Huh Other Clinician: Referring Physician: Lelon Huh Treating Physician/Extender: BURNS III, Charlean Sanfilippo in Treatment: 8 History of Present Illness HPI Description: Pleasant 81 year old with history of diabetes (hemoglobin A1c 8.6 in April 2016), peripheral neuropathy, peripheral vascular disease (status post bilateral lower extremity stents per patient), atrial fibrillation, and chronic kidney disease. He developed a blister on his right medial calf in April 2016 and a subsequent ulceration. No significant improvement with Silvadene. He reports mild pain with pressure. No claudication or rest pain. Ambulating normally per his baseline. Seen in f/u by Dr Lucky Cowboy June 2016. R ABI 0.5. No vascular intervention recommended. Will consider arteriogram if the ulcer does not heal. Culture grew Candida. No antibiotics. Performing dressing changes with Prisma and Tubigrip for edema control with progressive improvement. He returns to clinic for follow-up and is without new complaints. No significant pain. Denies fever or chills. No significant drainage. Electronic Signature(s) Signed: 03/26/2015 3:34:44 PM By: Loletha Grayer MD Entered By: Loletha Grayer on 03/26/2015 14:50:18 [place], [name]  ([id number]) -------------------------------------------------------------------------------- Physical Exam Details Patient Name: [name]. Date of Service: 03/26/2015 1:45 PM Medical Record Number: [id number] Patient Account Number: [id number] Date of Birth/Sex: 1925/02/04 (81 y.o. Male) Treating RN: Baruch Gouty, RN, BSN, Velva Harman Primary Care Physician: Lelon Huh Other Clinician: Referring Physician: Lelon Huh Treating Physician/Extender: BURNS III, WALTER Weeks in Treatment: 8 Constitutional . Pulse regular. Respirations normal and unlabored. Afebrile. . Notes Right calf ulcer improved. Still full-thickness. No exposed deep structures. No significant cellulitis. Tender. 1+ pitting edema. No  palpable pedal pulses per his baseline. Electronic Signature(s) Signed: 03/26/2015 3:34:44 PM By: Loletha Grayer MD Entered By: Loletha Grayer on 03/26/2015 14:51:01 Rettke, [name] ([id number]) -------------------------------------------------------------------------------- Physician Orders Details Patient Name: [name]. Date of Service: 03/26/2015 1:45 PM Medical Record Number: [id number] Patient Account Number: [id number] Date of Birth/Sex: 1925/05/12 (81 y.o. Male) Treating RN: Baruch Gouty, RN, BSN, Velva Harman Primary Care Physician: Lelon Huh Other Clinician: Referring Physician: Lelon Huh Treating Physician/Extender: BURNS III, Charlean Sanfilippo in Treatment: 8 Verbal / Phone Orders: Yes Clinician: Afful, RN, BSN, Rita Read Back and Verified: Yes Diagnosis Coding Wound Cleansing Wound #1 Right,Medial Lower Leg o Cleanse wound with mild soap and water o May Shower, gently pat wound dry prior to applying new dressing. o May shower with protection. Anesthetic Wound #1 Right,Medial Lower Leg o Topical Lidocaine 4% cream applied to wound bed prior to debridement Primary Wound Dressing Wound #1 Right,Medial Lower Leg o Promogran Secondary Dressing Wound #1  Right,Medial Lower Leg o ABD and Kerlix/Conform Dressing Change Frequency Wound #1 Right,Medial Lower Leg o Change dressing every other day. Follow-up Appointments Wound #1 Right,Medial Lower Leg o Return Appointment in 2 weeks. Edema Control Wound #1 Right,Medial Lower Leg o Patient to wear own compression stockings Additional Orders / Instructions Wound #1 Right,Medial Lower Leg o Increase protein intake. o Activity as tolerated BENOIT, MEECH ([id number]) Electronic Signature(s) Signed: 03/26/2015 2:10:34 PM By: Regan Lemming BSN, RN Signed: 03/26/2015 3:34:44 PM By: Loletha Grayer MD Previous Signature: 03/26/2015 2:10:14 PM Version By: Regan Lemming BSN, RN Entered By: Regan Lemming on 03/26/2015 14:10:34 [place], [name] ([id number]) -------------------------------------------------------------------------------- Problem List Details Patient Name: JERRETT, BALDINGER H. Date of Service: 03/26/2015 1:45 PM Medical Record Number: [id number] Patient Account Number: [id number] Date of Birth/Sex: 1925-06-23 (81 y.o. 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 Date of Service: 03/26/2015 1:45 PM Medical Record Number: [id number] Patient Account Number: [id number] Date of Birth/Sex: [birth year] (81 y.o. Male) Treating RN: Baruch Gouty, RN, BSN, Velva Harman Primary Care Physician: Lelon Huh Other Clinician: Referring Physician: Lelon Huh Treating Physician/Extender: BURNS III, Charlean Sanfilippo in Treatment: 8 Subjective Chief Complaint Information obtained from Patient Right calf ulcer. Arterial insufficiency. History of Present Illness (HPI) Pleasant 81 year old with history of diabetes (hemoglobin A1c 8.6 in April 2016), peripheral neuropathy, peripheral vascular disease (status post bilateral lower extremity stents per patient), atrial fibrillation, and chronic kidney disease. He developed a blister on his right medial calf in April 2016 and a subsequent ulceration. No significant improvement with Silvadene. He reports mild pain with pressure. No claudication or rest pain. Ambulating normally per his baseline. Seen in f/u by Dr Lucky Cowboy June 2016. R ABI 0.5. No vascular intervention recommended. Will  consider arteriogram if the ulcer does not heal. Culture grew Candida. No antibiotics. Performing dressing changes with Prisma and Tubigrip for edema control with progressive improvement. He returns to clinic for follow-up and is without new complaints. No significant pain. Denies fever or chills. No significant drainage. Objective Constitutional Pulse regular. Respirations normal and unlabored. Afebrile. Vitals Time Taken: 2:02 PM, Height: 72 in, Weight: 202.1 lbs, BMI: 27.4, Temperature: 97.9 F, Pulse: 81 bpm, Respiratory Rate: 17 breaths/min, Blood Pressure: 138/69 mmHg. General Notes: Right calf  ulcer improved. Still full-thickness. No exposed deep structures. No significant cellulitis. Tender. 1+ pitting edema. No palpable pedal pulses per his baseline. Integumentary (Hair, Skin) [place], Harlie H. ([id number]) Wound #1 status is Open. Original cause of wound was Blister. The wound is located on the Right,Medial Lower Leg. The wound measures 0.5cm length x 1.1cm width x 0.1cm depth; 0.432cm^2 area and 0.043cm^3 volume. The wound is limited to skin breakdown. There is no tunneling noted. There is a small amount of serosanguineous drainage noted. The wound margin is distinct with the outline attached to the wound base. There is medium (34-66%) red, pink granulation within the wound bed. There is a small (1-33%) amount of necrotic tissue within the wound bed including Adherent Slough. The periwound skin appearance exhibited: Localized Edema, Moist, Hemosiderin Staining, Erythema. The periwound skin appearance did not exhibit: Callus, Crepitus, Excoriation, Fluctuance, Friable, Induration, Rash, Scarring, Dry/Scaly, Maceration, Atrophie Blanche, Cyanosis, Ecchymosis, Mottled, Pallor, Rubor. The surrounding wound skin color is noted with erythema which is circumferential. Periwound temperature was noted as No Abnormality. The periwound has tenderness on palpation. 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 Arterial insufficiency. Procedures Wound #1 Wound #1 is a Diabetic Wound/Ulcer of the Lower Extremity located on the Right,Medial Lower Leg . There was a Skin/Subcutaneous Tissue Debridement ([id number]) debridement with total area of 0.55  sq cm performed by BURNS III, Teressa Senter., MD. with the following instrument(s): Curette to remove Viable and Non-Viable tissue/material including Fat, Fibrin/Slough, and Subcutaneous after achieving pain control using Lidocaine 4% Topical Solution. A time out was conducted prior to the start of the procedure. A Minimum amount of bleeding was controlled with Pressure. The procedure was tolerated well with a pain level of 0 throughout and a pain level of 0 following the procedure. Post Debridement Measurements: 0.5cm length x 1.1cm width x 0.2cm depth; 0.086cm^3 volume. CAREY, JOHNDROW ([id number]) Plan Wound Cleansing: Wound #1 Right,Medial Lower Leg: Cleanse wound with mild soap and water May Shower, gently pat wound dry prior to applying new dressing. May shower with protection. Anesthetic: Wound #1 Right,Medial Lower Leg: Topical Lidocaine 4% cream applied to wound bed prior to debridement Primary Wound Dressing: Wound #1 Right,Medial Lower Leg: Promogran Secondary Dressing: Wound #1 Right,Medial Lower Leg: ABD and Kerlix/Conform Dressing Change Frequency: Wound #1 Right,Medial Lower Leg: Change dressing every other day. Follow-up Appointments: Wound #1 Right,Medial Lower Leg: Return Appointment in 2 weeks. Edema Control: Wound #1 Right,Medial Lower Leg: Patient to wear own compression stockings Additional Orders / Instructions: Wound #1 Right,Medial Lower Leg: Increase protein intake. Activity as tolerated Promogran. Tubigrip for edema control. Electronic Signature(s) Signed: 03/26/2015 3:34:44 PM By: Loletha Grayer MD Entered By: Kathreen Cosier,  Walter on 03/26/2015 14:51:34 [place], [name] ([id number]) -------------------------------------------------------------------------------- SuperBill Details Patient Name: AMOUR, CUTRONE. Date of Service: 03/26/2015 Medical Record Number: [id number] Patient Account Number: [id number] Date of Birth/Sex: 01-Apr-1925 (81 y.o.  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 I48.2 Chronic atrial fibrillation Facility Procedures CPT4: Description Modifier Quantity Code [id number] 11042 - DEB SUBQ TISSUE 20 SQ CM/< 1 ICD-10 Description Diagnosis 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

## 2015-03-28 NOTE — Progress Notes (Signed)
[name], Bridges ([id number]) Visit Report for 03/26/2015 Arrival Information Details Patient Name: [name], [name]. Date of Service: 03/26/2015 1:45 PM Medical Record Number: [id number] Patient Account Number: [id number] Date of Birth/Sex: 09-10-1925 (81 y.o. Male) Treating RN: Baruch Gouty, RN, BSN, Velva Harman Primary Care Physician: Lelon Huh Other Clinician: Referring Physician: Lelon Huh Treating Physician/Extender: BURNS III, Charlean Sanfilippo in Treatment: 8 Visit Information History Since Last Visit Any new allergies or adverse reactions: No Patient Arrived: Ambulatory Had a fall or experienced change in No Arrival Time: 13:56 activities of daily living that may affect Accompanied By: SELF risk of falls: Transfer Assistance: None Signs or symptoms of abuse/neglect since last No Patient Identification Verified: Yes visito Secondary Verification Process Yes Hospitalized since last visit: No Completed: Has Dressing in Place as Prescribed: Yes Patient Has Alerts: Yes Pain Present Now: No Patient Alerts: Patient on Blood Thinner DMII Plavix and ASA 5/16 ABI (L/R)- inaudible Electronic Signature(s) Signed: 03/26/2015 1:56:50 PM By: Regan Lemming BSN, RN Entered By: Regan Lemming on 03/26/2015 13:56:50 Shomaker, [name] ([id number]) -------------------------------------------------------------------------------- Encounter Discharge Information Details Patient Name: [name]. Date of Service: 03/26/2015 1:45 PM Medical Record Number: [id number] Patient Account Number: [id number] Date of Birth/Sex: 22-Oct-1924 (81 y.o. Male) Treating RN: Baruch Gouty, RN, BSN, Velva Harman Primary Care Physician: Lelon Huh Other Clinician: Referring Physician: Lelon Huh Treating Physician/Extender: BURNS III, Charlean Sanfilippo in Treatment: 8 Encounter Discharge Information Items Discharge Pain Level: 0 Discharge Condition: Stable Ambulatory Status: Ambulatory Discharge Destination:  Home Private Transportation: Auto Accompanied By: SELF Schedule Follow-up Appointment: No Medication Reconciliation completed and No provided to Patient/Care Awa Bachicha: Clinical Summary of Care: Electronic Signature(s) Signed: 03/26/2015 2:17:51 PM By: Regan Lemming BSN, RN Entered By: Regan Lemming on 03/26/2015 14:17:51 [place] ([id number]) -------------------------------------------------------------------------------- Lower Extremity Assessment Details Patient Name: [name]. Date of Service: 03/26/2015 1:45 PM Medical Record Number: [id number] Patient Account Number: [id number] Date of Birth/Sex: 10/15/1924 (81 y.o. Male) Treating RN: Baruch Gouty, RN, BSN, Velva Harman Primary Care Physician: Lelon Huh Other Clinician: Referring Physician: Lelon Huh Treating Physician/Extender: BURNS III, Charlean Sanfilippo in Treatment: 8 Edema Assessment Assessed: [Left: No] [Right: No] E[Left: dema] [Right: :] Calf Left: Right: Point of Measurement: 35 cm From Medial Instep cm 35.2 cm Ankle Left: Right: Point of Measurement: 12 cm From Medial Instep cm 22.3 cm Vascular Assessment Claudication: Claudication Assessment [Right:None] Pulses: Posterior Tibial Dorsalis Pedis Palpable: [Right:Yes] Extremity colors, hair growth, and conditions: Extremity Color: [Right:Mottled] Hair Growth on Extremity: [Right:No] Temperature of Extremity: [Right:Warm] Capillary Refill: [Right:< 3 seconds] Dependent Rubor: [Right:No] Blanched when Elevated: [Right:No] Lipodermatosclerosis: [Right:No] Toe Nail Assessment Left: Right: Thick: Yes Discolored: Yes Deformed: No Improper Length and Hygiene: No JIMMYLEE, RATTERREE ([id number]) Electronic Signature(s) Signed: 03/27/2015 5:42:40 PM By: Regan Lemming BSN, RN Entered By: Regan Lemming on 03/26/2015 13:54:34 Tyndall, [name] ([id number]) -------------------------------------------------------------------------------- Multi Wound Chart  Details Patient Name: [name]. Date of Service: 03/26/2015 1:45 PM Medical Record Number: [id number] Patient Account Number: [id number] Date of Birth/Sex: 08-05-1925 (81 y.o. Male) Treating RN: Baruch Gouty, RN, BSN, Velva Harman Primary Care Physician: Lelon Huh Other Clinician: Referring Physician: Lelon Huh Treating Physician/Extender: BURNS III, WALTER Weeks in Treatment: 8 Vital Signs Height(in): 72 Pulse(bpm): 81 Weight(lbs): 202.1 Blood Pressure 138/69 (mmHg): Body Mass Index(BMI): 27 Temperature(F): 97.9 Respiratory Rate 17 (breaths/min): Photos: [1:No Photos] [N/A:N/A] Wound Location: [1:Right Lower Leg - Medial N/A] Wounding Event: [1:Blister] [N/A:N/A] Primary Etiology: [1:Diabetic Wound/Ulcer of N/A the Lower Extremity] Comorbid History: [1:Cataracts, Sleep Apnea, N/A Arrhythmia, Congestive Heart Failure, Hypertension, Peripheral  Arterial Disease, Type II Diabetes, Gout, Osteoarthritis, Neuropathy] Date Acquired: [1:12/30/2014] [N/A:N/A] Weeks of Treatment: [1:8] [N/A:N/A] Wound Status: [1:Open] [N/A:N/A] Measurements L x W x D 0.5x1.1x0.1 [N/A:N/A] (cm) Area (cm) : [1:0.432] [N/A:N/A] Volume (cm) : [1:0.043] [N/A:N/A] % Reduction in Area: [1:85.70%] [N/A:N/A] % Reduction in Volume: 85.80% [N/A:N/A] Classification: [1:Grade 1] [N/A:N/A] Exudate Amount: [1:Small] [N/A:N/A] Exudate Type: [1:Serosanguineous] [N/A:N/A] Exudate Color: [1:red, brown] [N/A:N/A] Wound Margin: [1:Distinct, outline attached N/A] Granulation Amount: [1:Medium (34-66%)] [N/A:N/A] Granulation Quality: [1:Red, Pink] [N/A:N/A] Necrotic Amount: [1:Small (1-33%)] [N/A:N/A] Exposed Structures: [N/A:N/A] Fascia: No Fat: No Tendon: No Muscle: No Joint: No Bone: No Limited to Skin Breakdown Epithelialization: Medium (34-66%) N/A N/A Periwound Skin Texture: Edema: Yes N/A N/A Excoriation: No Induration: No Callus: No Crepitus: No Fluctuance: No Friable: No Rash: No Scarring:  No Periwound Skin Moist: Yes N/A N/A Moisture: Maceration: No Dry/Scaly: No Periwound Skin Color: Erythema: Yes N/A N/A Hemosiderin Staining: Yes Atrophie Blanche: No Cyanosis: No Ecchymosis: No Mottled: No Pallor: No Rubor: No Erythema Location: Circumferential N/A N/A Temperature: No Abnormality N/A N/A Tenderness on Yes N/A N/A Palpation: Wound Preparation: Ulcer Cleansing: N/A N/A Rinsed/Irrigated with Saline Topical Anesthetic Applied: Other: Lidocaine 4% Ointment Treatment Notes Electronic Signature(s) Signed: 03/26/2015 2:09:42 PM By: Regan Lemming BSN, RN Entered By: Regan Lemming on 03/26/2015 [place], [place] ([id number]) -------------------------------------------------------------------------------- [place] Details Patient Name: [name]. Date of Service: 03/26/2015 1:45 PM Medical Record Number: [id number] Patient Account Number: [id number] Date of Birth/Sex: 27-Apr-1925 (81 y.o. Male) Treating RN: Baruch Gouty, RN, BSN, Velva Harman Primary Care Physician: Lelon Huh Other Clinician: Referring Physician: Lelon Huh Treating Physician/Extender: BURNS III, Charlean Sanfilippo in Treatment: 8 Active Inactive Abuse / Safety / Falls / Self Care Management Nursing Diagnoses: Impaired home maintenance Impaired physical mobility Knowledge deficit related to: safety; personal, health (wound), emergency Potential for falls Self care deficit: actual or potential Goals: Patient will remain injury free Date Initiated: 01/29/2015 Goal Status: Active Patient/caregiver will verbalize understanding of skin care regimen Date Initiated: 01/29/2015 Goal Status: Active Patient/caregiver will verbalize/demonstrate measure taken to improve self care Date Initiated: 01/29/2015 Goal Status: Active Patient/caregiver will verbalize/demonstrate measures taken to improve the patient's personal safety Date Initiated: 01/29/2015 Goal Status:  Active Patient/caregiver will verbalize/demonstrate measures taken to prevent injury and/or falls Date Initiated: 01/29/2015 Goal Status: Active Patient/caregiver will verbalize/demonstrate understanding of what to do in case of emergency Date Initiated: 01/29/2015 Goal Status: Active Interventions: Assess: immobility, friction, shearing, incontinence upon admission and as needed Assess impairment of mobility on admission and as needed per policy Assess self care needs on admission and as needed Patient referred to community resources (specify in notes) Provide education on basic hygiene MONICO, SUDDUTH ([id number]) Provide education on fall prevention Provide education on personal and home safety Provide education on safe transfers Treatment Activities: Education provided on Basic Hygiene : 01/29/2015 Notes: Orientation to the Wound Care Program Nursing Diagnoses: Knowledge deficit related to the wound healing center program Goals: Patient/caregiver will verbalize understanding of the [facility] Date Initiated: 01/29/2015 Goal Status: Active Interventions: Provide education on orientation to the wound center Notes: Venous Leg Ulcer Nursing Diagnoses: Knowledge deficit related to disease process and management Potential for venous Insuffiency (use before diagnosis confirmed) Goals: Patient will maintain optimal edema control Date Initiated: 01/29/2015 Goal Status: Active Patient/caregiver will verbalize understanding of disease process and disease management Date Initiated: 01/29/2015 Goal Status: Active Verify adequate tissue perfusion prior to therapeutic compression application Date Initiated: 01/29/2015 Goal Status: Active Interventions:  Assess peripheral edema status every visit. Compression as ordered Provide education on venous insufficiency Notes: CAVEN, PERINE ([id number]) Wound/Skin Impairment Nursing Diagnoses: Impaired tissue  integrity Knowledge deficit related to smoking impact on wound healing Knowledge deficit related to ulceration/compromised skin integrity Goals: Patient/caregiver will verbalize understanding of skin care regimen Date Initiated: 01/29/2015 Goal Status: Active Ulcer/skin breakdown will have a volume reduction of 30% by week 4 Date Initiated: 01/29/2015 Goal Status: Active Ulcer/skin breakdown will have a volume reduction of 50% by week 8 Date Initiated: 01/29/2015 Goal Status: Active Ulcer/skin breakdown will have a volume reduction of 80% by week 12 Date Initiated: 01/29/2015 Goal Status: Active Ulcer/skin breakdown will heal within 14 weeks Date Initiated: 01/29/2015 Goal Status: Active Interventions: Assess patient/caregiver ability to obtain necessary supplies Assess patient/caregiver ability to perform ulcer/skin care regimen upon admission and as needed Assess ulceration(s) every visit Provide education on ulcer and skin care Notes: Electronic Signature(s) Signed: 03/26/2015 2:09:24 PM By: Regan Lemming BSN, RN Entered By: Regan Lemming on 03/26/2015 14:09:24 Abella, [name] ([id number]) -------------------------------------------------------------------------------- Pain Assessment Details Patient Name: [name]. Date of Service: 03/26/2015 1:45 PM Medical Record Number: [id number] Patient Account Number: [id number] Date of Birth/Sex: [birth year] (81 y.o. Male) Treating RN: Baruch Gouty, RN, BSN, Velva Harman Primary Care Physician: Lelon Huh Other Clinician: Referring Physician: Lelon Huh Treating Physician/Extender: BURNS III, Charlean Sanfilippo in Treatment: 8 Active Problems Location of Pain Severity and Description of Pain Patient Has Paino No Site Locations Pain Management and Medication Current Pain Management: Electronic Signature(s) Signed: 03/26/2015 2:00:14 PM By: Regan Lemming BSN, RN Entered By: Regan Lemming on 03/26/2015 14:00:14 [place], [name]  ([id number]) -------------------------------------------------------------------------------- Patient/Caregiver Education Details Patient Name: [name]. Date of Service: 03/26/2015 1:45 PM Medical Record Number: [id number] Patient Account Number: [id number] Date of Birth/Gender: 06/06/1925 (81 y.o. Male) Treating RN: Baruch Gouty, RN, BSN, Velva Harman Primary Care Physician: Lelon Huh Other Clinician: Referring Physician: Lelon Huh Treating Physician/Extender: BURNS III, Charlean Sanfilippo in Treatment: 8 Education Assessment Education Provided To: Patient Education Topics Provided Basic Hygiene: Methods: Explain/Verbal Responses: State content correctly Safety: Methods: Explain/Verbal Responses: State content correctly Venous: Methods: Explain/Verbal Responses: State content correctly Welcome To [facility] [place]: Methods: Explain/Verbal Responses: State content correctly Wound/Skin Impairment: Methods: Explain/Verbal Responses: State content correctly Electronic Signature(s) Signed: 03/26/2015 2:18:14 PM By: Regan Lemming BSN, RN Entered By: Regan Lemming on 03/26/2015 14:18:14 Williams, [name] ([id number]) -------------------------------------------------------------------------------- Wound Assessment Details Patient Name: [name]. Date of Service: 03/26/2015 1:45 PM Medical Record Number: [id number] Patient Account Number: [id number] Date of Birth/Sex: 1925-04-07 (81 y.o. Male) Treating RN: Baruch Gouty, RN, BSN, [place] Primary Care Physician: Lelon Huh Other Clinician: Referring Physician: Lelon Huh Treating Physician/Extender: BURNS III, WALTER Weeks in Treatment: 8 Wound Status Wound Number: 1 Primary Diabetic Wound/Ulcer of the Lower Etiology: Extremity Wound Location: Right Lower Leg - Medial Wound Open Wounding Event: Blister Status: Date Acquired: 12/30/2014 Comorbid Cataracts, Sleep Apnea, Arrhythmia, Weeks Of Treatment: 8 History: Congestive  Heart Failure, Hypertension, Clustered Wound: No Peripheral Arterial Disease, Type II Diabetes, Gout, Osteoarthritis, Neuropathy Wound Measurements Length: (cm) 0.5 Width: (cm) 1.1 Depth: (cm) 0.1 Area: (cm) 0.432 Volume: (cm) 0.043 % Reduction in Area: 85.7% % Reduction in Volume: 85.8% Epithelialization: Medium (34-66%) Tunneling: No Wound Description Classification: Grade 1 Wound Margin: Distinct, outline attached Exudate Amount: Small Exudate Type: Serosanguineous Exudate Color: red, brown Foul Odor After Cleansing: No Wound Bed Granulation Amount: Medium (34-66%) Exposed Structure Granulation Quality: Red, Pink Fascia Exposed: No Necrotic Amount: Small (1-33%) Fat  Layer Exposed: No Necrotic Quality: Adherent Slough Tendon Exposed: No Muscle Exposed: No Joint Exposed: No Bone Exposed: No Limited to Skin Breakdown Periwound Skin Texture Texture Color No Abnormalities Noted: No No Abnormalities Noted: No Callus: No Atrophie Blanche: No HARNOOR, KOHLES. ([id number]) Crepitus: No Cyanosis: No Excoriation: No Ecchymosis: No Fluctuance: No Erythema: Yes Friable: No Erythema Location: Circumferential Induration: No Hemosiderin Staining: Yes Localized Edema: Yes Mottled: No Rash: No Pallor: No Scarring: No Rubor: No Moisture Temperature / Pain No Abnormalities Noted: No Temperature: No Abnormality Dry / Scaly: No Tenderness on Palpation: Yes Maceration: No Moist: Yes Wound Preparation Ulcer Cleansing: Rinsed/Irrigated with Saline Topical Anesthetic Applied: Other: Lidocaine 4% Ointment , Treatment Notes Wound #1 (Right, Medial Lower Leg) 1. Cleansed with: Clean wound with Normal Saline 3. Peri-wound Care: Skin Prep 4. Dressing Applied: Promogran 5. Secondary Dressing Applied Gauze and Kerlix/Conform 7. Secured with Recruitment consultant) Signed: 03/27/2015 5:42:40 PM By: Regan Lemming BSN, RN Entered By: Regan Lemming on 03/26/2015  14:02:51 Mcnew, [name] ([id number]) -------------------------------------------------------------------------------- Vitals Details Patient Name: [name]. Date of Service: 03/26/2015 1:45 PM Medical Record Number: [id number] Patient Account Number: [id number] Date of Birth/Sex: 08-28-1925 (81 y.o. Male) Treating RN: Baruch Gouty, RN, BSN, [place] Primary Care Physician: Lelon Huh Other Clinician: Referring Physician: Lelon Huh Treating Physician/Extender: BURNS III, WALTER Weeks in Treatment: 8 Vital Signs Time Taken: 14:02 Temperature (F): 97.9 Height (in): 72 Pulse (bpm): 81 Weight (lbs): 202.1 Respiratory Rate (breaths/min): 17 Body Mass Index (BMI): 27.4 Blood Pressure (mmHg): 138/69 Reference Range: 80 - 120 mg / dl Electronic Signature(s) Signed: 03/27/2015 5:42:40 PM By: Regan Lemming BSN, RN Entered By: Regan Lemming on 03/26/2015 14:03:18

## 2015-03-31 ENCOUNTER — Telehealth: Payer: Self-pay | Admitting: Family Medicine

## 2015-03-31 ENCOUNTER — Ambulatory Visit
Admission: RE | Admit: 2015-03-31 | Discharge: 2015-03-31 | Disposition: A | Discharge status: Home / Self Care | Payer: Medicare Other | Source: Ambulatory Visit | Attending: Oncology | Admitting: Oncology

## 2015-03-31 DIAGNOSIS — K869 Disease of pancreas, unspecified: Secondary | ICD-10-CM | POA: Insufficient documentation

## 2015-03-31 DIAGNOSIS — K8689 Other specified diseases of pancreas: Secondary | ICD-10-CM

## 2015-03-31 DIAGNOSIS — I714 Abdominal aortic aneurysm, without rupture: Secondary | ICD-10-CM | POA: Diagnosis not present

## 2015-03-31 LAB — GLUCOSE, CAPILLARY: Glucose-Capillary: 97 mg/dL (ref 65–99)

## 2015-03-31 MED ORDER — FLUDEOXYGLUCOSE F - 18 (FDG) INJECTION
12.9100 | Freq: Once | INTRAVENOUS | Status: AC | PRN
  Administered 2015-03-31: 12.91 via INTRAVENOUS

## 2015-03-31 NOTE — Telephone Encounter (Signed)
pts son called saying his father was having a biopsy on his pancreas and the Doctor doing the surgery said he needed to come off his diabetes med's and Plavix before the surgery.  They just wanted to check with you about this.  Please call them -back at [phone].

## 2015-04-01 ENCOUNTER — Inpatient Hospital Stay: Payer: Medicare Other | Attending: Oncology | Admitting: Oncology

## 2015-04-01 VITALS — BP 154/77 | HR 84 | Temp 97.0°F | Resp 18 | Wt 202.4 lb

## 2015-04-01 DIAGNOSIS — R918 Other nonspecific abnormal finding of lung field: Secondary | ICD-10-CM

## 2015-04-01 DIAGNOSIS — Z7982 Long term (current) use of aspirin: Secondary | ICD-10-CM | POA: Diagnosis not present

## 2015-04-01 DIAGNOSIS — K8689 Other specified diseases of pancreas: Secondary | ICD-10-CM

## 2015-04-01 DIAGNOSIS — N433 Hydrocele, unspecified: Secondary | ICD-10-CM | POA: Diagnosis not present

## 2015-04-01 DIAGNOSIS — I10 Essential (primary) hypertension: Secondary | ICD-10-CM | POA: Diagnosis not present

## 2015-04-01 DIAGNOSIS — K869 Disease of pancreas, unspecified: Secondary | ICD-10-CM | POA: Insufficient documentation

## 2015-04-01 DIAGNOSIS — Z8639 Personal history of other endocrine, nutritional and metabolic disease: Secondary | ICD-10-CM | POA: Diagnosis not present

## 2015-04-01 DIAGNOSIS — Z79899 Other long term (current) drug therapy: Secondary | ICD-10-CM | POA: Diagnosis not present

## 2015-04-01 DIAGNOSIS — Z85828 Personal history of other malignant neoplasm of skin: Secondary | ICD-10-CM | POA: Insufficient documentation

## 2015-04-01 DIAGNOSIS — E119 Type 2 diabetes mellitus without complications: Secondary | ICD-10-CM | POA: Diagnosis not present

## 2015-04-01 DIAGNOSIS — E039 Hypothyroidism, unspecified: Secondary | ICD-10-CM | POA: Diagnosis not present

## 2015-04-01 DIAGNOSIS — I739 Peripheral vascular disease, unspecified: Secondary | ICD-10-CM | POA: Insufficient documentation

## 2015-04-01 DIAGNOSIS — I714 Abdominal aortic aneurysm, without rupture: Secondary | ICD-10-CM

## 2015-04-03 NOTE — Telephone Encounter (Signed)
LMOVM for cb. ?

## 2015-04-03 NOTE — Telephone Encounter (Signed)
-----   Message from Birdie Sons, MD sent at 04/03/2015  2:20 PM EDT ----- Received message that oncologist wanted to take patient off of Plavix and diabetes medications in order to do biopsy. Please advise that it is fine to stop these medications until after biopsy.    ----------------------------------------------------------------------------------- Original message from 03-31-2015  pts son called saying his father was having a biopsy on his pancreas and the Doctor doing the surgery said he needed to come off his diabetes med's and Plavix before the surgery. They just wanted to check with you about this. Please call them -back at [phone].

## 2015-04-04 MED ORDER — OXYCODONE-ACETAMINOPHEN 5-325 MG PO TABS: 1.0000 | ORAL_TABLET | Freq: Three times a day (TID) | ORAL | Status: DC | PRN

## 2015-04-04 NOTE — Addendum Note (Signed)
Addended by: Birdie Sons on: 04/04/2015 12:35 PM   Modules accepted: Orders

## 2015-04-04 NOTE — Telephone Encounter (Signed)
Patient's son notified. Patient is also requesting a refill on his pain medication and he would also like more sample insulin needles.

## 2015-04-07 ENCOUNTER — Telehealth: Payer: Self-pay | Admitting: Family Medicine

## 2015-04-07 NOTE — Telephone Encounter (Signed)
Pt called to request refill for Oxycodone and I advised pt that RX was up front ready for pick up. Thanks TNP

## 2015-04-09 ENCOUNTER — Ambulatory Visit: Payer: Self-pay | Admitting: General Surgery

## 2015-04-09 DIAGNOSIS — K589 Irritable bowel syndrome without diarrhea: Secondary | ICD-10-CM | POA: Insufficient documentation

## 2015-04-09 DIAGNOSIS — I779 Disorder of arteries and arterioles, unspecified: Secondary | ICD-10-CM | POA: Insufficient documentation

## 2015-04-09 DIAGNOSIS — L98499 Non-pressure chronic ulcer of skin of other sites with unspecified severity: Secondary | ICD-10-CM | POA: Insufficient documentation

## 2015-04-09 DIAGNOSIS — M1990 Unspecified osteoarthritis, unspecified site: Secondary | ICD-10-CM | POA: Insufficient documentation

## 2015-04-09 DIAGNOSIS — Z8719 Personal history of other diseases of the digestive system: Secondary | ICD-10-CM | POA: Insufficient documentation

## 2015-04-09 DIAGNOSIS — B351 Tinea unguium: Secondary | ICD-10-CM | POA: Insufficient documentation

## 2015-04-09 DIAGNOSIS — N5089 Other specified disorders of the male genital organs: Secondary | ICD-10-CM | POA: Insufficient documentation

## 2015-04-09 DIAGNOSIS — R609 Edema, unspecified: Secondary | ICD-10-CM | POA: Insufficient documentation

## 2015-04-09 DIAGNOSIS — N183 Chronic kidney disease, stage 3 unspecified: Secondary | ICD-10-CM | POA: Insufficient documentation

## 2015-04-09 DIAGNOSIS — I739 Peripheral vascular disease, unspecified: Secondary | ICD-10-CM

## 2015-04-10 ENCOUNTER — Ambulatory Visit (INDEPENDENT_AMBULATORY_CARE_PROVIDER_SITE_OTHER): Payer: Medicare Other | Admitting: Family Medicine

## 2015-04-10 ENCOUNTER — Encounter: Payer: Self-pay | Admitting: Family Medicine

## 2015-04-10 VITALS — BP 110/52 | HR 48 | Temp 98.3°F | Resp 18 | Wt 201.0 lb

## 2015-04-10 DIAGNOSIS — E1149 Type 2 diabetes mellitus with other diabetic neurological complication: Secondary | ICD-10-CM | POA: Diagnosis not present

## 2015-04-10 DIAGNOSIS — R001 Bradycardia, unspecified: Secondary | ICD-10-CM | POA: Diagnosis not present

## 2015-04-10 DIAGNOSIS — I4891 Unspecified atrial fibrillation: Secondary | ICD-10-CM | POA: Diagnosis not present

## 2015-04-10 DIAGNOSIS — E1165 Type 2 diabetes mellitus with hyperglycemia: Secondary | ICD-10-CM

## 2015-04-10 DIAGNOSIS — R42 Dizziness and giddiness: Secondary | ICD-10-CM | POA: Diagnosis not present

## 2015-04-10 DIAGNOSIS — IMO0002 Reserved for concepts with insufficient information to code with codable children: Secondary | ICD-10-CM

## 2015-04-10 NOTE — Progress Notes (Signed)
Patient: [name] Male    DOB: 11-Feb-1925   81 y.o.   MRN: [id number] Visit Date: 04/10/2015  Today's Provider: Lelon Huh, MD   Chief Complaint  Patient presents with  . Dizziness   Subjective:    Dizziness This is a new problem. The current episode started yesterday. The problem occurs intermittently. Progression since onset: recurrent. Associated symptoms include fatigue, numbness (in feet), vertigo and weakness. Pertinent negatives include no abdominal pain, anorexia, arthralgias, change in bowel habit, chest pain, chills, congestion, coughing, diaphoresis, fever, headaches, joint swelling, myalgias, nausea, neck pain, rash, sore throat, swollen glands, visual change or vomiting. Nothing aggravates the symptoms. He has tried rest for the symptoms. The treatment provided mild relief.   Patient was admitted earlier this month and found to have pancreatic mass and referred to [facility] for surgery. He was found to be in a-fib with bradycardia at his pre-op and was put on holter monitor which will be completed today. He has had episodes of dizziness off and on.      Allergies  Allergen Reactions  . Crestor  [Rosuvastatin Calcium]   . Penicillins Rash   Previous Medications   ALLOPURINOL (ZYLOPRIM) 100 MG TABLET    Take 100 mg by mouth 2 (two) times daily.   ASPIRIN EC 81 MG TABLET    Take 81 mg by mouth daily.   BUMETANIDE (BUMEX) 2 MG TABLET    Take 2 mg by mouth daily. 1 or 2 tablets AS NEEDED   CLOPIDOGREL (PLAVIX) 75 MG TABLET    Take 75 mg by mouth daily.   COD LIVER OIL PO       GABAPENTIN (NEURONTIN) 300 MG CAPSULE    Take 900 mg by mouth at bedtime.    GARLIC 9323 MG CAPS    Take by mouth daily.   GLIPIZIDE (GLUCOTROL) 5 MG TABLET    Take by mouth 2 (two) times daily before a meal.   GLUCOSE BLOOD TEST STRIP    1 each daily.   INSULIN GLARGINE (TOUJEO SOLOSTAR) 300 UNIT/ML SOPN    Inject 10 Units into the skin daily.   LEVOTHYROXINE (SYNTHROID, LEVOTHROID) 112  MCG TABLET    Take 112 mcg by mouth daily before breakfast.   LISINOPRIL (PRINIVIL,ZESTRIL) 10 MG TABLET    Take 10 mg by mouth daily.   MONTELUKAST (SINGULAIR) 10 MG TABLET    Take 10 mg by mouth at bedtime.   OMEGA-3 FATTY ACIDS (FISH OIL) 1000 MG CAPS    Take by mouth 2 (two) times daily.   OXYCODONE-ACETAMINOPHEN (PERCOCET/ROXICET) 5-325 MG PER TABLET    Take 1 tablet by mouth every 8 (eight) hours as needed for moderate pain.   PRAVASTATIN (PRAVACHOL) 40 MG TABLET    Take 40 mg by mouth daily.   RANITIDINE (ZANTAC) 150 MG TABLET    Take 150 mg by mouth 2 (two) times daily. AS NEEDED   SILVER SULFADIAZINE (SILVADENE) 1 % CREAM    Apply 1 application topically 2 (two) times daily. to affected area   VITAMIN B-12 (CYANOCOBALAMIN) 1000 MCG TABLET    Inject 1,000 mcg into the muscle every 30 (thirty) days.    Review of Systems  Constitutional: Positive for fatigue. Negative for fever, chills and diaphoresis.  HENT: Negative for congestion and sore throat.   Respiratory: Negative for cough and shortness of breath.   Cardiovascular: Negative for chest pain.  Gastrointestinal: Negative for nausea, vomiting, abdominal pain, anorexia and  change in bowel habit.  Musculoskeletal: Negative for myalgias, joint swelling, arthralgias and neck pain.  Skin: Negative for rash.  Neurological: Positive for dizziness, vertigo, weakness and numbness (in feet). Negative for headaches.    History  Substance Use Topics  . Smoking status: Never Smoker   . Smokeless tobacco: Not on file  . Alcohol Use: No   Objective:   BP 110/52 mmHg  Pulse 48  Temp(Src) 98.3 F (36.8 C) (Oral)  Resp 18  Wt 201 lb (91.173 kg)  SpO2 97%  Physical Exam  General Appearance:    Alert, cooperative, no distress  Eyes:    PERRL, conjunctiva/corneas clear, EOM's intact       Lungs:     Clear to auscultation bilaterally, respirations unlabored  Heart:    Irregular rate and rhythm. Bradycardia  Neurologic:   Awake, alert,  oriented x 3. No apparent focal neurological           defect.            Assessment & Plan:     1. Bradycardia Is completing holter monitor today and scheduled to follow up with Dr. Ubaldo Glassing next week.   2. Atrial fibrillation, unspecified Is already on ASA and Plavix  3. Dizziness Likely multifactorial. Will reduce lisinopril in half for now while awaiting holter results.   4. Diabetes mellitus with neurological manifestations, uncontrolled Had A1c done at ER the first of the month at 8.1 Continue current medications well for now.   Follow up 3 months.       Lelon Huh, MD  [facility]

## 2015-04-10 NOTE — Patient Instructions (Signed)
Reduce lisinopril 10mg  to 1/2 tablet daily until you see Dr. Ubaldo Glassing.

## 2015-04-13 NOTE — Progress Notes (Signed)
[place]  [phone] [phone]  ID: [name] OB: 22-May-1925  MR#: [id number]  FBP#:[id number]  [name], MD as PCP - General (Family Medicine) [name], MD as Referring Physician (Vascular Surgery) [name], MD as Consulting Physician (Cardiology) [name], MD as Consulting Physician (Hematology and Oncology)  CHIEF COMPLAINT:  Chief Complaint  Patient presents with  . Follow-up    PET results    INTERVAL HISTORY: Patient is an 81 year old male who presented to the emergency room with increasing abdominal pain. He had no other symptoms. He returns to clinic today to discuss the results of his PET scan. His pain is well-controlled with his current narcotic regimen. He has no neurologic complaints. He denies any weight loss. He denies any recent fevers. He has a good appetite and denies any nausea, vomiting, constipation, or diarrhea. He has no changes in his bowel movements and denies any melanotic or hematochezia. He has no urinary complaints. Patient otherwise feels well and offers no further specific complaints.  REVIEW OF SYSTEMS:   Review of Systems  Constitutional: Negative.   Respiratory: Negative.   Cardiovascular: Negative.   Gastrointestinal: Negative.     As per HPI. Otherwise, a complete review of systems is negatve.  PAST MEDICAL HISTORY: Past Medical History  Diagnosis Date  . Diabetes mellitus without complication   . Hypertension   . PAD (peripheral artery disease)   . Gout   . Hypothyroidism   . Cancer     Skin cancer  . History of chicken pox   . History of measles as a child   . History of mumps as a child     PAST SURGICAL HISTORY: Past Surgical History  Procedure Laterality Date  . Appendectomy    . Carotid endarterectomy Right     Dr. Lucky Cowboy  . Heart stents    . Bilateral leg stents      Bilateral iliac and right popliteal  . Skin cancer excision    .  Cataract extraction  09/2013  . Angioplasty  2009, 2010    done by Dr. Hulda Humphrey and Dr. Dian Situ    FAMILY HISTORY Family History  Problem Relation Age of Onset  . Diabetes Mother   . Cancer Sister     Uterine  . Arthritis Father   . Kidney disease Father        ADVANCED DIRECTIVES:    HEALTH MAINTENANCE: History  Substance Use Topics  . Smoking status: Never Smoker   . Smokeless tobacco: Not on file  . Alcohol Use: No     Colonoscopy:  PAP:  Bone density:  Lipid panel:  Allergies  Allergen Reactions  . Crestor  [Rosuvastatin Calcium]   . Penicillins Rash    Current Outpatient Prescriptions  Medication Sig Dispense Refill  . allopurinol (ZYLOPRIM) 100 MG tablet Take 100 mg by mouth 2 (two) times daily.    Marland Kitchen aspirin EC 81 MG tablet Take 81 mg by mouth daily.    . bumetanide (BUMEX) 2 MG tablet Take 2 mg by mouth daily. 1 or 2 tablets AS NEEDED    . clopidogrel (PLAVIX) 75 MG tablet Take 75 mg by mouth daily.    Marland Kitchen gabapentin (NEURONTIN) 300 MG capsule Take 900 mg by mouth at bedtime.     . Garlic 8242 MG CAPS Take by mouth daily.    Marland Kitchen glipiZIDE (GLUCOTROL) 5 MG tablet Take by mouth 2 (two) times daily  before a meal.    . levothyroxine (SYNTHROID, LEVOTHROID) 112 MCG tablet Take 112 mcg by mouth daily before breakfast.    . lisinopril (PRINIVIL,ZESTRIL) 10 MG tablet Take 10 mg by mouth daily.    . montelukast (SINGULAIR) 10 MG tablet Take 10 mg by mouth at bedtime.    . Omega-3 Fatty Acids (FISH OIL) 1000 MG CAPS Take by mouth 2 (two) times daily.    . pravastatin (PRAVACHOL) 40 MG tablet Take 40 mg by mouth daily.    . ranitidine (ZANTAC) 150 MG tablet Take 150 mg by mouth 2 (two) times daily. AS NEEDED    . vitamin B-12 (CYANOCOBALAMIN) 1000 MCG tablet Inject 1,000 mcg into the muscle every 30 (thirty) days.    . COD LIVER OIL PO     . glucose blood test strip 1 each daily.    . Insulin Glargine (TOUJEO SOLOSTAR) 300 UNIT/ML SOPN Inject 10 Units into the skin daily.      Marland Kitchen oxyCODONE-acetaminophen (PERCOCET/ROXICET) 5-325 MG per tablet Take 1 tablet by mouth every 8 (eight) hours as needed for moderate pain. 30 tablet 0  . silver sulfADIAZINE (SILVADENE) 1 % cream Apply 1 application topically 2 (two) times daily. to affected area     No current facility-administered medications for this visit.    OBJECTIVE: Filed Vitals:   04/01/15 1127  BP: 154/77  Pulse: 84  Temp: 97 F (36.1 C)  Resp: 18     Body mass index is 27.44 kg/(m^2).    ECOG FS:1 - Symptomatic but completely ambulatory  General: Well-developed, well-nourished, no acute distress. Eyes: Pink conjunctiva, anicteric sclera. HEENT: Normocephalic, moist mucous membranes, clear oropharnyx. Lungs: Clear to auscultation bilaterally. Heart: Regular rate and rhythm. No rubs, murmurs, or gallops. Abdomen: Soft, nontender, nondistended. No organomegaly noted, normoactive bowel sounds. Musculoskeletal: No edema, cyanosis, or clubbing. Neuro: Alert, answering all questions appropriately. Cranial nerves grossly intact. Skin: No rashes or petechiae noted. Psych: Normal affect. Lymphatics: No cervical, calvicular, axillary or inguinal LAD.   LAB RESULTS:  Lab Results  Component Value Date   NA 140 03/14/2015   K 4.0 03/14/2015   CL 96* 03/14/2015   CO2 34* 03/14/2015   GLUCOSE 97 03/14/2015   BUN 23* 03/14/2015   CREATININE 1.72* 03/14/2015   CALCIUM 9.1 03/14/2015   PROT 7.2 03/14/2015   ALBUMIN 4.2 03/14/2015   AST 25 03/14/2015   ALT 15* 03/14/2015   ALKPHOS 91 03/14/2015   BILITOT 0.5 03/14/2015   GFRNONAA 34* 03/14/2015   GFRAA 39* 03/14/2015    Lab Results  Component Value Date   WBC 8.2 03/14/2015   NEUTROABS 6.7* 03/14/2015   HGB 14.9 03/14/2015   HCT 46.3 03/14/2015   MCV 97.3 03/14/2015   PLT 145* 03/14/2015     STUDIES: Ct Abdomen Pelvis W Contrast  03/14/2015   CLINICAL DATA:  Two day history of epigastric pain.  EXAM: CT ABDOMEN AND PELVIS WITH CONTRAST   TECHNIQUE: Multidetector CT imaging of the abdomen and pelvis was performed using the standard protocol following bolus administration of intravenous contrast.  CONTRAST:  60 mL Omnipaque 300 intravenous  COMPARISON:  05/13/2013  FINDINGS: There is a 3.2 x 4.4 x 6.5 cm mass involving the pancreatic tail. The mass contiguously involves the posterior wall of the gastric body and there is stranding opacity tethering the medial aspect of the proximal descending colon. 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 The splenic  artery is patent. The superior mesenteric artery is patent, with preservation of surrounding fat. A few small regional nodes are suggested.  The liver, gallbladder and bile ducts appear unremarkable. The pancreatic head is unremarkable. Spleen is normal in size and unremarkable. The ureters and kidneys are unremarkable.  Abdominal aorta is normal in caliber with moderate atherosclerotic calcification. 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  There is moderate colonic diverticulosis. Small bowel appears normal.  There are multiple noncalcified nodules in the lung bases, measuring up to 3.7 mm. For the most part these appear to be unchanged from 05/13/2013 and they are likely benign.  The pancreatic tail mass likely represents a primary neoplasm. Acute pancreatitis can simulate a neoplasm and is not entirely excluded, but there is a notable absence of surrounding inflammatory change and an absence of fluid around the pancreas, along Gerota's fashion or in the pericolic gutters. 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  IMPRESSION: 1. 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 Acute pancreatitis is less likely but not entirely excluded. 2. The pancreatic tail abnormality directly extends to the posterior wall of the stomach and to  the medial wall of proximal descending colon, without obstruction of either of these structures. 3. 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 4. No hepatic metastases are evident. The multiple noncalcified lung base nodules are more likely benign. 5. 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 6. Diverticulosis 7. Abdominal MRI without/with contrast will be helpful in further characterization and definition of the extent of disease.   Electronically Signed   By: Andreas Newport M.D.   On: 03/14/2015 21:47   Nm Pet Image Initial (pi) Skull Base To Thigh  03/31/2015   CLINICAL DATA:  Initial treatment strategy for pancreatic mass seen on recent CT.  EXAM: NUCLEAR MEDICINE PET SKULL BASE TO THIGH  TECHNIQUE: 12.9 mCi F-18 FDG was injected intravenously. Full-ring PET imaging was performed from the skull base to thigh after the radiotracer. CT data was obtained and used for attenuation correction and anatomic localization.  FASTING BLOOD GLUCOSE:  Value: 97 mg/dl  COMPARISON:  CT on 03/14/2015  FINDINGS: NECK  No hypermetabolic lymph nodes in the neck.  CHEST  No hypermetabolic mediastinal or hilar nodes. No suspicious pulmonary nodules on the CT scan.  ABDOMEN/PELVIS  No abnormal hypermetabolic activity within the liver, adrenal glands, or spleen.  An irregular mass is seen in the pancreatic tail measuring approximately 4 x 6.5 cm which is hypermetabolic, with SUV max of 8.2. This is highly suspicious for primary pancreatic carcinoma.  Ill-defined soft tissue density in the peripancreatic fat shows hypermetabolic activity with SUV max of 4.4. This appears to involve the adjacent post to wall the stomach and medial wall of the proximal descending colon, consistent with local tumor extension. No hypermetabolic lymph nodes identified within the abdomen or pelvis.  Sigmoid diverticulosis noted, without evidence of  diverticulitis. Left scrotal hydrocele incidentally noted. 3.8 cm infrarenal abdominal aortic aneurysm again demonstrated as well as 1.5 cm right renal artery aneurysm.  SKELETON  No focal hypermetabolic activity to suggest skeletal metastasis.  IMPRESSION: 6.5 cm hypermetabolic mass involving the pancreatic tail, highly suspicious for primary pancreatic adenocarcinoma. Consider endoscopic ultrasound for tissue diagnosis.  Hypermetabolic peripancreatic soft tissue density involving the posterior wall of the stomach and proximal descending  colon, consistent with local tumor extension.  No evidence of distant metastatic disease.  Incidental findings as described above, which include a 3.8 cm infrarenal abdominal aortic aneurysm. Recommend follow up by [place] in 2 years. This recommendation follows ACR consensus guidelines: White Paper of the ACR Incidental Findings Committee II on Vascular Findings. J Am Coll Radiol 2013; 10:789-794.   Electronically Signed   By: Earle Gell M.D.   On: 03/31/2015 14:44    ASSESSMENT: Pancreatic tail mass highly suspicious for malignancy.  PLAN:    1. Pancreatic mass: PET scan results as above reviewed independently. CA-19-9 is elevated at 112. Patient will have a EUS scheduled at [facility] in the next several weeks and then return to clinic in the first 1 to 2 weeks of August to discuss the results and potential treatment planning.  Approximately 30 minutes was spent in discussion and consultation.   Patient expressed understanding and was in agreement with this plan. He also understands that He can call clinic at any time with any questions, concerns, or complaints.   No matching staging information was found for the patient.  Lloyd Huger, MD   04/13/2015 8:24 AM

## 2015-04-14 ENCOUNTER — Telehealth: Payer: Self-pay | Admitting: *Deleted

## 2015-04-14 ENCOUNTER — Inpatient Hospital Stay: Payer: Medicare Other | Attending: Oncology | Admitting: Oncology

## 2015-04-14 VITALS — BP 93/46 | HR 51 | Temp 98.0°F | Ht 72.0 in | Wt 196.7 lb

## 2015-04-14 DIAGNOSIS — K869 Disease of pancreas, unspecified: Secondary | ICD-10-CM

## 2015-04-14 DIAGNOSIS — N433 Hydrocele, unspecified: Secondary | ICD-10-CM | POA: Diagnosis not present

## 2015-04-14 DIAGNOSIS — E039 Hypothyroidism, unspecified: Secondary | ICD-10-CM | POA: Diagnosis not present

## 2015-04-14 DIAGNOSIS — Z8619 Personal history of other infectious and parasitic diseases: Secondary | ICD-10-CM | POA: Diagnosis not present

## 2015-04-14 DIAGNOSIS — I10 Essential (primary) hypertension: Secondary | ICD-10-CM | POA: Diagnosis not present

## 2015-04-14 DIAGNOSIS — Z85828 Personal history of other malignant neoplasm of skin: Secondary | ICD-10-CM | POA: Insufficient documentation

## 2015-04-14 DIAGNOSIS — Z7982 Long term (current) use of aspirin: Secondary | ICD-10-CM | POA: Insufficient documentation

## 2015-04-14 DIAGNOSIS — I739 Peripheral vascular disease, unspecified: Secondary | ICD-10-CM | POA: Diagnosis not present

## 2015-04-14 DIAGNOSIS — Z809 Family history of malignant neoplasm, unspecified: Secondary | ICD-10-CM | POA: Insufficient documentation

## 2015-04-14 DIAGNOSIS — R109 Unspecified abdominal pain: Secondary | ICD-10-CM | POA: Diagnosis not present

## 2015-04-14 DIAGNOSIS — K5730 Diverticulosis of large intestine without perforation or abscess without bleeding: Secondary | ICD-10-CM | POA: Diagnosis not present

## 2015-04-14 DIAGNOSIS — Z794 Long term (current) use of insulin: Secondary | ICD-10-CM | POA: Diagnosis not present

## 2015-04-14 DIAGNOSIS — Z79899 Other long term (current) drug therapy: Secondary | ICD-10-CM | POA: Insufficient documentation

## 2015-04-14 DIAGNOSIS — I714 Abdominal aortic aneurysm, without rupture: Secondary | ICD-10-CM

## 2015-04-14 DIAGNOSIS — E119 Type 2 diabetes mellitus without complications: Secondary | ICD-10-CM | POA: Diagnosis not present

## 2015-04-14 DIAGNOSIS — K8689 Other specified diseases of pancreas: Secondary | ICD-10-CM

## 2015-04-14 DIAGNOSIS — Z8639 Personal history of other endocrine, nutritional and metabolic disease: Secondary | ICD-10-CM | POA: Diagnosis not present

## 2015-04-14 HISTORY — DX: Other specified diseases of pancreas: K86.89

## 2015-04-14 MED ORDER — FENTANYL 12 MCG/HR TD PT72: 12.5000 ug | MEDICATED_PATCH | TRANSDERMAL | Status: DC

## 2015-04-14 NOTE — Telephone Encounter (Signed)
Has increased abd pain over weekend and needs to be seen. Was unable to have biopsy at [facility], needs cardiac clearance first. Dr Grayland Ormond agrred to see pt today adn he agreed to 2:30 appt today

## 2015-04-14 NOTE — Progress Notes (Signed)
  Oncology Nurse Navigator Documentation  Referral date to RadOnc/MedOnc: 04/14/15 (04/14/15 1500) Navigator Encounter Type: Initial MedOnc (04/14/15 1500)               Pt seeing Dr Ubaldo Glassing 04/17/15. Can potentially has EUS 8/11 if cleared by Dr Ubaldo Glassing.

## 2015-04-14 NOTE — Progress Notes (Signed)
Acute add on today for increased abdominal pain since Saturday; pain relieved with prn pain meds; denies any nausea or vomiting or any other symptoms; took milk of magnesium twice with good results; scheduled for biopsy last week but due to decreased heart rate did not have done

## 2015-04-15 ENCOUNTER — Other Ambulatory Visit: Payer: Self-pay | Admitting: *Deleted

## 2015-04-15 ENCOUNTER — Ambulatory Visit: Payer: Self-pay | Admitting: Surgery

## 2015-04-15 MED ORDER — CLOPIDOGREL BISULFATE 75 MG PO TABS: 75.0000 mg | ORAL_TABLET | Freq: Every day | ORAL | Status: DC

## 2015-04-15 MED ORDER — GABAPENTIN 300 MG PO CAPS: 900.0000 mg | ORAL_CAPSULE | Freq: Every day | ORAL | Status: DC

## 2015-04-15 NOTE — Telephone Encounter (Signed)
Refill request for gabapentin 300 mg Last filled by MD on- 06/03/2014 #90 x11 Last Appt: 04/10/2015 Next Appt: 07/11/2015 Please advise refill? Also requesting 90 supply of clopidogrel 75 mg qd sent to [facility].

## 2015-04-16 ENCOUNTER — Ambulatory Visit: Payer: Self-pay | Admitting: Oncology

## 2015-04-18 ENCOUNTER — Telehealth: Payer: Self-pay

## 2015-04-18 ENCOUNTER — Other Ambulatory Visit: Payer: Self-pay | Admitting: Family Medicine

## 2015-04-18 NOTE — Telephone Encounter (Signed)
  Oncology Nurse Navigator Documentation    Navigator Encounter Type: Telephone (04/18/15 1000)               Spoke with Mr Bonneau on the phone. Had appt with Dr Ubaldo Glassing 8/4. He will be having a pacemaker placed 8/17 and will follow up with Dr Ubaldo Glassing a week later to make sure it is working properly. Will be able to proceed with EUS after this is completed.

## 2015-04-18 NOTE — Progress Notes (Signed)
[place]  Telephone:(336) [phone] [phone]) [phone]  ID: [name] OB: 07/14/1925  MR#: [id number]  LGX#:[id number]  [name], MD as PCP - General (Family Medicine) [name], MD as Referring Physician (Vascular Surgery) [name], MD as Consulting Physician (Cardiology) [name], MD as Consulting Physician (Hematology and Oncology)  CHIEF COMPLAINT:  Chief Complaint  Patient presents with  . Follow-up    acute add on for increased pain; pancreatic mass    INTERVAL HISTORY: Patient returns to clinic today as an acute add-on with complaints of increasing abdominal pain. His EUS and biopsy was delayed secondary to cardiac workup and possible need for pacemaker. He has no neurologic complaints. He denies any weight loss. He denies any recent fevers. He has a good appetite and denies any nausea, vomiting, constipation, or diarrhea. He has no changes in his bowel movements and denies any melanotic or hematochezia. He has no urinary complaints. Patient offers no further specific complaints today.  REVIEW OF SYSTEMS:   Review of Systems  Constitutional: Negative.   Respiratory: Negative.   Cardiovascular: Negative.   Gastrointestinal: Positive for abdominal pain.    As per HPI. Otherwise, a complete review of systems is negatve.  PAST MEDICAL HISTORY: Past Medical History  Diagnosis Date  . Diabetes mellitus without complication   . Hypertension   . PAD (peripheral artery disease)   . Gout   . Hypothyroidism   . Cancer     Skin cancer  . History of chicken pox   . History of measles as a child   . History of mumps as a child     PAST SURGICAL HISTORY: Past Surgical History  Procedure Laterality Date  . Appendectomy    . Carotid endarterectomy Right     Dr. Lucky Cowboy  . Heart stents    . Bilateral leg stents      Bilateral iliac and right popliteal  . Skin cancer excision    . Cataract extraction  09/2013    . Angioplasty  2009, 2010    done by Dr. Hulda Humphrey and Dr. Dian Situ    FAMILY HISTORY Family History  Problem Relation Age of Onset  . Diabetes Mother   . Cancer Sister     Uterine  . Arthritis Father   . Kidney disease Father        ADVANCED DIRECTIVES:    HEALTH MAINTENANCE: History  Substance Use Topics  . Smoking status: Never Smoker   . Smokeless tobacco: Not on file  . Alcohol Use: No     Colonoscopy:  PAP:  Bone density:  Lipid panel:  Allergies  Allergen Reactions  . Crestor  [Rosuvastatin Calcium]   . Penicillins Rash    Current Outpatient Prescriptions  Medication Sig Dispense Refill  . allopurinol (ZYLOPRIM) 100 MG tablet Take 100 mg by mouth 2 (two) times daily.    Marland Kitchen aspirin EC 81 MG tablet Take 81 mg by mouth daily.    . bumetanide (BUMEX) 2 MG tablet Take 2 mg by mouth daily. 1 or 2 tablets AS NEEDED    . COD LIVER OIL PO     . Garlic 8144 MG CAPS Take by mouth daily.    Marland Kitchen glipiZIDE (GLUCOTROL) 5 MG tablet Take by mouth 2 (two) times daily before a meal.    . glucose blood test strip 1 each daily.    . Insulin Glargine (TOUJEO SOLOSTAR) 300 UNIT/ML SOPN Inject 10 Units into  the skin daily.    Marland Kitchen levothyroxine (SYNTHROID, LEVOTHROID) 112 MCG tablet Take 112 mcg by mouth daily before breakfast.    . lisinopril (PRINIVIL,ZESTRIL) 10 MG tablet Take 10 mg by mouth daily.    . montelukast (SINGULAIR) 10 MG tablet Take 10 mg by mouth at bedtime.    . Omega-3 Fatty Acids (FISH OIL) 1000 MG CAPS Take by mouth 2 (two) times daily.    Marland Kitchen oxyCODONE-acetaminophen (PERCOCET/ROXICET) 5-325 MG per tablet Take 1 tablet by mouth every 8 (eight) hours as needed for moderate pain. 30 tablet 0  . pravastatin (PRAVACHOL) 40 MG tablet Take 40 mg by mouth daily.    . ranitidine (ZANTAC) 150 MG tablet Take 150 mg by mouth 2 (two) times daily. AS NEEDED    . silver sulfADIAZINE (SILVADENE) 1 % cream Apply 1 application topically 2 (two) times daily. to affected area    . vitamin  B-12 (CYANOCOBALAMIN) 1000 MCG tablet Inject 1,000 mcg into the muscle every 30 (thirty) days.    . clopidogrel (PLAVIX) 75 MG tablet Take 1 tablet (75 mg total) by mouth daily. 30 tablet 5  . fentaNYL (DURAGESIC - DOSED MCG/HR) 12 MCG/HR Place 1 patch (12.5 mcg total) onto the skin every 3 (three) days. 10 patch 0  . gabapentin (NEURONTIN) 300 MG capsule Take 3 capsules (900 mg total) by mouth at bedtime. 90 capsule 5  . GLIPIZIDE XL 5 MG 24 hr tablet TAKE 1-2 TABLETS BY MOUTH DAILY 60 tablet 12   No current facility-administered medications for this visit.    OBJECTIVE: Filed Vitals:   04/14/15 1445  BP: 93/46  Pulse: 51  Temp: 98 F (36.7 C)     Body mass index is 26.66 kg/(m^2).    ECOG FS:1 - Symptomatic but completely ambulatory  General: Well-developed, well-nourished, no acute distress. Eyes: Pink conjunctiva, anicteric sclera. Lungs: Clear to auscultation bilaterally. Heart: Regular rate and rhythm. No rubs, murmurs, or gallops. Abdomen: Soft, nontender, nondistended. No organomegaly noted, normoactive bowel sounds. Musculoskeletal: No edema, cyanosis, or clubbing. Neuro: Alert, answering all questions appropriately. Cranial nerves grossly intact. Skin: No rashes or petechiae noted. Psych: Normal affect.  LAB RESULTS:  Lab Results  Component Value Date   NA 140 03/14/2015   K 4.0 03/14/2015   CL 96* 03/14/2015   CO2 34* 03/14/2015   GLUCOSE 97 03/14/2015   BUN 23* 03/14/2015   CREATININE 1.72* 03/14/2015   CALCIUM 9.1 03/14/2015   PROT 7.2 03/14/2015   ALBUMIN 4.2 03/14/2015   AST 25 03/14/2015   ALT 15* 03/14/2015   ALKPHOS 91 03/14/2015   BILITOT 0.5 03/14/2015   GFRNONAA 34* 03/14/2015   GFRAA 39* 03/14/2015    Lab Results  Component Value Date   WBC 8.2 03/14/2015   NEUTROABS 6.7* 03/14/2015   HGB 14.9 03/14/2015   HCT 46.3 03/14/2015   MCV 97.3 03/14/2015   PLT 145* 03/14/2015     STUDIES: Nm Pet Image Initial (pi) Skull Base To  Thigh  03/31/2015   CLINICAL DATA:  Initial treatment strategy for pancreatic mass seen on recent CT.  EXAM: NUCLEAR MEDICINE PET SKULL BASE TO THIGH  TECHNIQUE: 12.9 mCi F-18 FDG was injected intravenously. Full-ring PET imaging was performed from the skull base to thigh after the radiotracer. CT data was obtained and used for attenuation correction and anatomic localization.  FASTING BLOOD GLUCOSE:  Value: 97 mg/dl  COMPARISON:  CT on 03/14/2015  FINDINGS: NECK  No hypermetabolic lymph nodes in the neck.  CHEST  No hypermetabolic mediastinal or hilar nodes. No suspicious pulmonary nodules on the CT scan.  ABDOMEN/PELVIS  No abnormal hypermetabolic activity within the liver, adrenal glands, or spleen.  An irregular mass is seen in the pancreatic tail measuring approximately 4 x 6.5 cm which is hypermetabolic, with SUV max of 8.2. This is highly suspicious for primary pancreatic carcinoma.  Ill-defined soft tissue density in the peripancreatic fat shows hypermetabolic activity with SUV max of 4.4. This appears to involve the adjacent post to wall the stomach and medial wall of the proximal descending colon, consistent with local tumor extension. No hypermetabolic lymph nodes identified within the abdomen or pelvis.  Sigmoid diverticulosis noted, without evidence of diverticulitis. Left scrotal hydrocele incidentally noted. 3.8 cm infrarenal abdominal aortic aneurysm again demonstrated as well as 1.5 cm right renal artery aneurysm.  SKELETON  No focal hypermetabolic activity to suggest skeletal metastasis.  IMPRESSION: 6.5 cm hypermetabolic mass involving the pancreatic tail, highly suspicious for primary pancreatic adenocarcinoma. Consider endoscopic ultrasound for tissue diagnosis.  Hypermetabolic peripancreatic soft tissue density involving the posterior wall of the stomach and proximal descending colon, consistent with local tumor extension.  No evidence of distant metastatic disease.  Incidental findings as  described above, which include a 3.8 cm infrarenal abdominal aortic aneurysm. Recommend follow up by [place] in 2 years. This recommendation follows ACR consensus guidelines: White Paper of the ACR Incidental Findings Committee II on Vascular Findings. J Am Coll Radiol 2013; 10:789-794.   Electronically Signed   By: Earle Gell M.D.   On: 03/31/2015 14:44    ASSESSMENT: Pancreatic tail mass highly suspicious for malignancy.  PLAN:    1. Pancreatic mass: PET scan results as above reviewed independently. CA-19-9 is elevated at 112. Once patient's cardiac workup is complete, EUS will be prescheduled at [facility] to determine a definitive diagnosis. If malignancy, given patient's advanced age surgery is likely not an option. We may benefit from chemotherapy using gemcitabine and Abraxane in the future. Return to clinic as previously scheduled to discuss his pathology results and treatment planning. 2. Pain: Patient was given a prescription for fentanyl patch and instructed to continue oxycodone as needed.   Approximately 30 minutes was spent in discussion and consultation.  Patient expressed understanding and was in agreement with this plan. He also understands that He can call clinic at any time with any questions, concerns, or complaints.   Lloyd Huger, MD   04/18/2015 1:10 PM

## 2015-04-23 ENCOUNTER — Ambulatory Visit: Payer: Self-pay | Admitting: Family Medicine

## 2015-04-24 ENCOUNTER — Encounter
Admission: RE | Admit: 2015-04-24 | Discharge: 2015-04-24 | Disposition: A | Discharge status: Home / Self Care | Payer: Medicare Other | Source: Ambulatory Visit | Attending: Cardiology | Admitting: Cardiology

## 2015-04-24 ENCOUNTER — Ambulatory Visit
Admission: RE | Admit: 2015-04-24 | Discharge: 2015-04-24 | Disposition: A | Discharge status: Home / Self Care | Payer: Medicare Other | Source: Ambulatory Visit | Attending: Cardiology | Admitting: Cardiology

## 2015-04-24 DIAGNOSIS — Z01810 Encounter for preprocedural cardiovascular examination: Secondary | ICD-10-CM | POA: Diagnosis present

## 2015-04-24 DIAGNOSIS — Z01818 Encounter for other preprocedural examination: Secondary | ICD-10-CM | POA: Insufficient documentation

## 2015-04-24 DIAGNOSIS — I10 Essential (primary) hypertension: Secondary | ICD-10-CM

## 2015-04-24 HISTORY — DX: Cardiac arrhythmia, unspecified: I49.9

## 2015-04-24 HISTORY — DX: Gastro-esophageal reflux disease without esophagitis: K21.9

## 2015-04-24 HISTORY — DX: Sick sinus syndrome: I49.5

## 2015-04-24 HISTORY — DX: Polyneuropathy, unspecified: G62.9

## 2015-04-24 HISTORY — DX: Chronic kidney disease, unspecified: N18.9

## 2015-04-24 LAB — BASIC METABOLIC PANEL
Anion gap: 9 (ref 5–15)
BUN: 26 mg/dL — AB (ref 6–20)
CHLORIDE: 96 mmol/L — AB (ref 101–111)
CO2: 30 mmol/L (ref 22–32)
CREATININE: 1.93 mg/dL — AB (ref 0.61–1.24)
Calcium: 8.9 mg/dL (ref 8.9–10.3)
GFR calc Af Amer: 34 mL/min — ABNORMAL LOW (ref >60)
GFR calc non Af Amer: 29 mL/min — ABNORMAL LOW (ref >60)
Glucose, Bld: 192 mg/dL — ABNORMAL HIGH (ref 65–99)
Potassium: 4.3 mmol/L (ref 3.5–5.1)
Sodium: 135 mmol/L (ref 135–145)

## 2015-04-24 LAB — CBC
HEMATOCRIT: 44.8 % (ref 40.0–52.0)
Hemoglobin: 14.4 g/dL (ref 13.0–18.0)
MCH: 30.7 pg (ref 26.0–34.0)
MCHC: 32.1 g/dL (ref 32.0–36.0)
MCV: 95.4 fL (ref 80.0–100.0)
Platelets: 120 10*3/uL — ABNORMAL LOW (ref 150–440)
RBC: 4.7 MIL/uL (ref 4.40–5.90)
RDW: 16 % — ABNORMAL HIGH (ref 11.5–14.5)
WBC: 7.2 10*3/uL (ref 3.8–10.6)

## 2015-04-24 LAB — DIFFERENTIAL
BASOS ABS: 0 10*3/uL (ref 0–0.1)
Basophils Relative: 1 %
Eosinophils Absolute: 0 10*3/uL (ref 0–0.7)
Eosinophils Relative: 1 %
LYMPHS PCT: 10 %
Lymphs Abs: 0.7 10*3/uL — ABNORMAL LOW (ref 1.0–3.6)
MONO ABS: 0.4 10*3/uL (ref 0.2–1.0)
Monocytes Relative: 6 %
NEUTROS ABS: 6 10*3/uL (ref 1.4–6.5)
NEUTROS PCT: 82 %

## 2015-04-24 LAB — APTT: aPTT: 30 seconds (ref 24–36)

## 2015-04-24 LAB — PROTIME-INR
INR: 1
Prothrombin Time: 13.4 seconds (ref 11.4–15.0)

## 2015-04-24 NOTE — Patient Instructions (Signed)
  Your procedure is scheduled on:04/30/15 Report to Day Surgery. [facility]OND [facility]LO[facility] To find out your arrival time please call [phone] between 1PM - 3PM on 04/29/15  Remember: Instructions that are not followed completely may result in serious medical risk, up to and including death, or upon the discretion of your surgeon and anesthesiologist your surgery may need to be rescheduled.    ___X_ 1. Do not eat food or drink liquids after midnight. No gum chewing or hard candies.     _X___ 2. No Alcohol for 24 hours before or after surgery.   ____ 3. Bring all medications with you on the day of surgery if instructed.    ___X_ 4. Notify your doctor if there is any change in your medical condition     (cold, fever, infections).     Do not wear jewelry, make-up, hairpins, clips or nail polish.  Do not wear lotions, powders, or perfumes. You may wear deodorant.  Do not shave 48 hours prior to surgery. Men may shave face and neck.  Do not bring valuables to the hospital.    [facility] is not responsible for any belongings or valuables.               Contacts, dentures or bridgework may not be worn into surgery.  Leave your suitcase in the car. After surgery it may be brought to your room.  For patients admitted to the hospital, discharge time is determined by your                treatment team.   Patients discharged the day of surgery will not be allowed to drive home.   Please read over the following fact sheets that you were given:   Surgical Site Infection Prevention   __X__ Take these medicines the morning of surgery with A SIP OF WATER:    1. TAKE AS INSTRUCTED BY HEART DOCTOR  2.   3.   4.  5.  6.  ____ Fleet Enema (as directed)  X ____ Use CHG Soap as directed  ____ Use inhalers on the day of surgery  ____ Stop metformin 2 days prior to surgery    _X___ Take 1/2 of usual insulin dose the night before surgery and none on the morning of surgery.   _X___ Stop  Coumadin/Plavix/aspirin on  ASPIRIN AND PLAVIX STOPPED ____ Stop Anti-inflammatories on   __X__ Stop supplements until after surgery.  STOP COD LIVER OIL,FISH OIL,   ____ Bring C-Pap to the hospital.

## 2015-04-29 ENCOUNTER — Telehealth: Payer: Self-pay

## 2015-04-29 NOTE — Telephone Encounter (Signed)
  Oncology Nurse Navigator Documentation    Navigator Encounter Type: Telephone (04/29/15 1600)               Pt called and is having pacemaker placed 8/17. Will reschedule EUS for 05/08/15 at [facility]

## 2015-04-30 ENCOUNTER — Inpatient Hospital Stay: Payer: Medicare Other | Admitting: Anesthesiology

## 2015-04-30 ENCOUNTER — Encounter
Admission: RE | Disposition: A | Discharge status: Home / Self Care | Payer: Self-pay | Source: Ambulatory Visit | Attending: Cardiology

## 2015-04-30 ENCOUNTER — Encounter: Payer: Self-pay | Admitting: *Deleted

## 2015-04-30 ENCOUNTER — Observation Stay: Payer: Medicare Other

## 2015-04-30 ENCOUNTER — Inpatient Hospital Stay: Payer: Medicare Other

## 2015-04-30 ENCOUNTER — Observation Stay
Admission: RE | Admit: 2015-04-30 | Discharge: 2015-05-01 | Disposition: A | Discharge status: Home / Self Care | Payer: Medicare Other | Source: Ambulatory Visit | Attending: Cardiology | Admitting: Cardiology

## 2015-04-30 DIAGNOSIS — E1122 Type 2 diabetes mellitus with diabetic chronic kidney disease: Secondary | ICD-10-CM | POA: Insufficient documentation

## 2015-04-30 DIAGNOSIS — E785 Hyperlipidemia, unspecified: Secondary | ICD-10-CM | POA: Diagnosis not present

## 2015-04-30 DIAGNOSIS — I739 Peripheral vascular disease, unspecified: Secondary | ICD-10-CM | POA: Diagnosis not present

## 2015-04-30 DIAGNOSIS — Z880 Allergy status to penicillin: Secondary | ICD-10-CM | POA: Diagnosis not present

## 2015-04-30 DIAGNOSIS — E039 Hypothyroidism, unspecified: Secondary | ICD-10-CM | POA: Insufficient documentation

## 2015-04-30 DIAGNOSIS — Z888 Allergy status to other drugs, medicaments and biological substances status: Secondary | ICD-10-CM | POA: Insufficient documentation

## 2015-04-30 DIAGNOSIS — I4891 Unspecified atrial fibrillation: Secondary | ICD-10-CM | POA: Diagnosis not present

## 2015-04-30 DIAGNOSIS — Z7982 Long term (current) use of aspirin: Secondary | ICD-10-CM | POA: Diagnosis not present

## 2015-04-30 DIAGNOSIS — Z8249 Family history of ischemic heart disease and other diseases of the circulatory system: Secondary | ICD-10-CM | POA: Insufficient documentation

## 2015-04-30 DIAGNOSIS — Z79899 Other long term (current) drug therapy: Secondary | ICD-10-CM | POA: Insufficient documentation

## 2015-04-30 DIAGNOSIS — K219 Gastro-esophageal reflux disease without esophagitis: Secondary | ICD-10-CM | POA: Diagnosis not present

## 2015-04-30 DIAGNOSIS — I728 Aneurysm of other specified arteries: Secondary | ICD-10-CM | POA: Insufficient documentation

## 2015-04-30 DIAGNOSIS — G629 Polyneuropathy, unspecified: Secondary | ICD-10-CM | POA: Diagnosis not present

## 2015-04-30 DIAGNOSIS — R5383 Other fatigue: Secondary | ICD-10-CM | POA: Insufficient documentation

## 2015-04-30 DIAGNOSIS — I1310 Hypertensive heart and chronic kidney disease without heart failure, with stage 1 through stage 4 chronic kidney disease, or unspecified chronic kidney disease: Secondary | ICD-10-CM | POA: Diagnosis not present

## 2015-04-30 DIAGNOSIS — N189 Chronic kidney disease, unspecified: Secondary | ICD-10-CM | POA: Diagnosis not present

## 2015-04-30 DIAGNOSIS — R0989 Other specified symptoms and signs involving the circulatory and respiratory systems: Secondary | ICD-10-CM | POA: Insufficient documentation

## 2015-04-30 DIAGNOSIS — Z794 Long term (current) use of insulin: Secondary | ICD-10-CM | POA: Insufficient documentation

## 2015-04-30 DIAGNOSIS — R001 Bradycardia, unspecified: Secondary | ICD-10-CM | POA: Diagnosis present

## 2015-04-30 DIAGNOSIS — Z950 Presence of cardiac pacemaker: Secondary | ICD-10-CM

## 2015-04-30 HISTORY — DX: Other specified diseases of pancreas: K86.89

## 2015-04-30 HISTORY — PX: PACEMAKER INSERTION: SHX728

## 2015-04-30 LAB — GLUCOSE, CAPILLARY
GLUCOSE-CAPILLARY: 75 mg/dL (ref 65–99)
Glucose-Capillary: 106 mg/dL — ABNORMAL HIGH (ref 65–99)

## 2015-04-30 SURGERY — INSERTION, CARDIAC PACEMAKER
Anesthesia: General

## 2015-04-30 MED ORDER — MONTELUKAST SODIUM 10 MG PO TABS
10.0000 mg | ORAL_TABLET | Freq: Every day | ORAL | Status: DC
  Administered 2015-05-01: 10 mg via ORAL
  Filled 2015-04-30 (×2): qty 1

## 2015-04-30 MED ORDER — SODIUM CHLORIDE 0.9 % IR SOLN
Status: DC | PRN
  Administered 2015-04-30: 200 mL

## 2015-04-30 MED ORDER — SODIUM CHLORIDE 0.9 % IV SOLN
INTRAVENOUS | Status: DC
  Administered 2015-04-30: 75 mL/h via INTRAVENOUS

## 2015-04-30 MED ORDER — LISINOPRIL 5 MG PO TABS
5.0000 mg | ORAL_TABLET | Freq: Every day | ORAL | Status: DC
  Administered 2015-05-01: 5 mg via ORAL
  Filled 2015-04-30: qty 1

## 2015-04-30 MED ORDER — CEFAZOLIN SODIUM 1-5 GM-% IV SOLN
1.0000 g | Freq: Four times a day (QID) | INTRAVENOUS | Status: AC
  Administered 2015-04-30 – 2015-05-01 (×3): 1 g via INTRAVENOUS
  Filled 2015-04-30 (×3): qty 50

## 2015-04-30 MED ORDER — PRAVASTATIN SODIUM 40 MG PO TABS
40.0000 mg | ORAL_TABLET | Freq: Every day | ORAL | Status: DC
  Filled 2015-04-30 (×2): qty 1

## 2015-04-30 MED ORDER — GENTAMICIN SULFATE 40 MG/ML IJ SOLN
INTRAMUSCULAR | Status: AC
  Filled 2015-04-30: qty 2

## 2015-04-30 MED ORDER — ONDANSETRON HCL 4 MG/2ML IJ SOLN: 4.0000 mg | Freq: Four times a day (QID) | INTRAMUSCULAR | Status: DC | PRN

## 2015-04-30 MED ORDER — FENTANYL CITRATE (PF) 100 MCG/2ML IJ SOLN: 25.0000 ug | INTRAMUSCULAR | Status: DC | PRN

## 2015-04-30 MED ORDER — GABAPENTIN 300 MG PO CAPS
300.0000 mg | ORAL_CAPSULE | Freq: Three times a day (TID) | ORAL | Status: DC
  Administered 2015-04-30: 300 mg via ORAL
  Filled 2015-04-30: qty 1

## 2015-04-30 MED ORDER — INSULIN GLARGINE 100 UNIT/ML ~~LOC~~ SOLN
10.0000 [IU] | Freq: Every day | SUBCUTANEOUS | Status: DC
  Administered 2015-04-30: 10 [IU] via SUBCUTANEOUS
  Filled 2015-04-30 (×2): qty 0.1

## 2015-04-30 MED ORDER — LIDOCAINE HCL (CARDIAC) 20 MG/ML IV SOLN
INTRAVENOUS | Status: DC | PRN
  Administered 2015-04-30: 10 mg via INTRAVENOUS

## 2015-04-30 MED ORDER — LEVOTHYROXINE SODIUM 100 MCG PO TABS
100.0000 ug | ORAL_TABLET | Freq: Every day | ORAL | Status: DC
  Administered 2015-05-01: 100 ug via ORAL
  Filled 2015-04-30: qty 1

## 2015-04-30 MED ORDER — LIDOCAINE 1 % OPTIME INJ - NO CHARGE
INTRAMUSCULAR | Status: DC | PRN
  Administered 2015-04-30: 39 mL

## 2015-04-30 MED ORDER — CEFAZOLIN SODIUM 1-5 GM-% IV SOLN
INTRAVENOUS | Status: AC
  Administered 2015-04-30: 1 g via INTRAVENOUS
  Filled 2015-04-30: qty 50

## 2015-04-30 MED ORDER — PROPOFOL INFUSION 10 MG/ML OPTIME
INTRAVENOUS | Status: DC | PRN
  Administered 2015-04-30: 25 ug/kg/min via INTRAVENOUS

## 2015-04-30 MED ORDER — LIDOCAINE HCL (PF) 1 % IJ SOLN
INTRAMUSCULAR | Status: AC
  Filled 2015-04-30: qty 30

## 2015-04-30 MED ORDER — FAMOTIDINE 20 MG PO TABS
20.0000 mg | ORAL_TABLET | Freq: Two times a day (BID) | ORAL | Status: DC
  Administered 2015-04-30 – 2015-05-01 (×2): 20 mg via ORAL
  Filled 2015-04-30 (×2): qty 1

## 2015-04-30 MED ORDER — SODIUM CHLORIDE 0.9 % IJ SOLN
INTRAMUSCULAR | Status: AC
  Filled 2015-04-30: qty 50

## 2015-04-30 MED ORDER — GABAPENTIN 300 MG PO CAPS
900.0000 mg | ORAL_CAPSULE | Freq: Every day | ORAL | Status: DC
  Administered 2015-04-30: 900 mg via ORAL
  Filled 2015-04-30: qty 3

## 2015-04-30 MED ORDER — ACETAMINOPHEN 325 MG PO TABS: 325.0000 mg | ORAL_TABLET | ORAL | Status: DC | PRN

## 2015-04-30 MED ORDER — CEFAZOLIN SODIUM 1-5 GM-% IV SOLN
1.0000 g | INTRAVENOUS | Status: AC
  Administered 2015-04-30: 1 g via INTRAVENOUS

## 2015-04-30 MED ORDER — SODIUM CHLORIDE 0.9 % IJ SOLN
INTRAMUSCULAR | Status: DC | PRN
  Administered 2015-04-30: 6 mL via INTRAVENOUS

## 2015-04-30 MED ORDER — GLIPIZIDE 5 MG PO TABS
5.0000 mg | ORAL_TABLET | Freq: Two times a day (BID) | ORAL | Status: DC
  Administered 2015-04-30 – 2015-05-01 (×2): 5 mg via ORAL
  Filled 2015-04-30 (×2): qty 1

## 2015-04-30 MED ORDER — SODIUM CHLORIDE 0.9 % IR SOLN
Freq: Once | Status: DC
  Filled 2015-04-30: qty 2

## 2015-04-30 MED ORDER — SODIUM CHLORIDE 0.9 % IV SOLN
INTRAVENOUS | Status: DC
  Administered 2015-04-30 – 2015-05-01 (×3): via INTRAVENOUS

## 2015-04-30 SURGICAL SUPPLY — 34 items
BAG DECANTER STRL (MISCELLANEOUS) ×3 IMPLANT
BRUSH SCRUB 4% CHG (MISCELLANEOUS) ×3 IMPLANT
CABLE SURG 12 DISP A/V CHANNEL (MISCELLANEOUS) ×3 IMPLANT
CANISTER SUCT 1200ML W/VALVE (MISCELLANEOUS) ×3 IMPLANT
CHLORAPREP W/TINT 26ML (MISCELLANEOUS) ×3 IMPLANT
COVER LIGHT HANDLE STERIS (MISCELLANEOUS) ×6 IMPLANT
COVER MAYO STAND STRL (DRAPES) ×3 IMPLANT
DRAPE C-ARM XRAY 36X54 (DRAPES) ×3 IMPLANT
DRESSING TELFA 4X3 1S ST N-ADH (GAUZE/BANDAGES/DRESSINGS) ×3 IMPLANT
DRSG TEGADERM 4X4.75 (GAUZE/BANDAGES/DRESSINGS) ×3 IMPLANT
GLOVE BIO SURGEON STRL SZ7.5 (GLOVE) ×6 IMPLANT
GLOVE BIO SURGEON STRL SZ8 (GLOVE) ×6 IMPLANT
GOWN STRL REUS W/ TWL LRG LVL3 (GOWN DISPOSABLE) ×1 IMPLANT
GOWN STRL REUS W/ TWL XL LVL3 (GOWN DISPOSABLE) ×1 IMPLANT
GOWN STRL REUS W/TWL LRG LVL3 (GOWN DISPOSABLE) ×2
GOWN STRL REUS W/TWL XL LVL3 (GOWN DISPOSABLE) ×2
IMMOBILIZER SHDR MD LX WHT (SOFTGOODS) IMPLANT
IMMOBILIZER SHDR XL LX WHT (SOFTGOODS) ×3 IMPLANT
INTRO PACEMAKR LEAD 9FR 13CM (INTRODUCER) ×3
INTRO PACEMKR SHEATH II 7FR (MISCELLANEOUS) ×3
INTRODUCER PACEMKR LD 9FR 13CM (INTRODUCER) ×1 IMPLANT
INTRODUCER PACEMKR SHTH II 7FR (MISCELLANEOUS) ×1 IMPLANT
IV NS 500ML (IV SOLUTION) ×2
IV NS 500ML BAXH (IV SOLUTION) ×1 IMPLANT
KIT RM TURNOVER STRD PROC AR (KITS) ×3 IMPLANT
LABEL OR SOLS (LABEL) ×3 IMPLANT
LEAD CAPSURE NOVUS 5092-58CM (Lead) ×3 IMPLANT
MARKER SKIN W/RULER 31145785 (MISCELLANEOUS) ×3 IMPLANT
PACEMAKER ADAPTA SR ADSR01 (Pacemaker) ×1 IMPLANT
PACK PACE INSERTION (MISCELLANEOUS) ×3 IMPLANT
PAD GROUND ADULT SPLIT (MISCELLANEOUS) ×3 IMPLANT
PAD STATPAD (MISCELLANEOUS) ×3 IMPLANT
PPM ADAPTA SR ADSR01 (Pacemaker) ×3 IMPLANT
SUT SILK 0 SH 30 (SUTURE) ×3 IMPLANT

## 2015-04-30 NOTE — OR Nursing (Signed)
Pacing pads removed from chest, patient denies pain.  Pacing at intervals.

## 2015-04-30 NOTE — Op Note (Signed)
[facility]   04/30/2015                     1:44 PM  PATIENT:  [name]    PRE-OPERATIVE DIAGNOSIS:  BRADYCARDIA  POST-OPERATIVE DIAGNOSIS:  Same  PROCEDURE:  INSERTION PACEMAKER  SURGEON:  Mycal Conde, MD    ANESTHESIA:     PREOPERATIVE INDICATIONS:  [name] is a  81 y.o. male with a diagnosis of BRADYCARDIA who failed conservative measures and elected for surgical management.    The risks benefits and alternatives were discussed with the patient preoperatively including but not limited to the risks of infection, bleeding, cardiopulmonary complications, the need for revision surgery, among others, and the patient was willing to proceed.   OPERATIVE PROCEDURE: Patient was brought to the operating room the fasting state. The left pectoral region was prepped and draped in the usual sterile manner. Anesthesia was obtained with 1% lidocaine locally. A 6 cm incision was performed a left pectoral region. The pacemaker pocket was generated by electrocautery and blunt dissection. Access was obtained to left subclavian vein by fine needle aspiration. The ventricular lead was positioned to right ventricular apical septum. After proper  thresholds were obtained the lead was sutured in place. The pacemaker pocket was irrigated with gentamicin solution. The pacemaker generator was positioned into the pocket and the pocket was closed with 20 and 40 Vicryls, respectively. Steri-Strips and a pressure dressing were applied.

## 2015-04-30 NOTE — Progress Notes (Signed)
   04/30/15 1800  Clinical Encounter Type  Visited With Family  Visit Type Spiritual support  Spiritual Encounters  Spiritual Needs Prayer  Stress Factors  Patient Stress Factors Health changes   Status: Status post cardiac pacemaker procedure/ male 50yrs Family: stepdaughter and husband Visit Assessment: The chaplain gave encouraging words to the patient's family before they left. They said that the patient was doing better.   Pastoral Care: [phone] pager or by online request

## 2015-04-30 NOTE — Anesthesia Preprocedure Evaluation (Signed)
Anesthesia Evaluation  Patient identified by MRN, date of birth, ID band Patient awake    Reviewed: Allergy & Precautions, H&P , NPO status , Patient's Chart, lab work & pertinent test results, reviewed documented beta blocker date and time   History of Anesthesia Complications Negative for: history of anesthetic complications  Airway Mallampati: I  TM Distance: >3 FB Neck ROM: limited    Dental no notable dental hx. (+) Edentulous Upper, Edentulous Lower, Upper Dentures   Pulmonary neg shortness of breath, neg sleep apnea, neg COPDneg recent URI,  breath sounds clear to auscultation  Pulmonary exam normal       Cardiovascular Exercise Tolerance: Good hypertension, On Medications - angina+ CAD, + Cardiac Stents (in 2015) and + Peripheral Vascular Disease - Past MI, - CABG and - CHF Normal cardiovascular exam+ dysrhythmias (Sick Sinus syndrome, resting HR in the 30s) Rhythm:regular Rate:Bradycardia  H/o CEA and AAA repair   Neuro/Psych negative neurological ROS  negative psych ROS   GI/Hepatic Neg liver ROS, GERD-  Medicated and Controlled,  Endo/Other  diabetes, Insulin Dependent, Oral Hypoglycemic AgentsHypothyroidism   Renal/GU CRFRenal disease  negative genitourinary   Musculoskeletal   Abdominal   Peds  Hematology negative hematology ROS (+)   Anesthesia Other Findings Past Medical History:   Diabetes mellitus without complication                       Hypertension                                                 PAD (peripheral artery disease)                              Gout                                                         Hypothyroidism                                               History of chicken pox                                       History of measles as a child                                History of mumps as a child                                  SSS (sick sinus syndrome)                                     Chronic kidney disease  Comment:ckd   GERD (gastroesophageal reflux disease)                       Neuropathy                                                   Dysrhythmia                                                    Comment:a fib   Cancer                                                         Comment:Skin cancer   Pancreatic mass                                 04/2015         Comment:scheduled for biopsy on 05/08/2015   Reproductive/Obstetrics negative OB ROS                             Anesthesia Physical Anesthesia Plan  ASA: IV  Anesthesia Plan: General   Post-op Pain Management:    Induction:   Airway Management Planned:   Additional Equipment:   Intra-op Plan:   Post-operative Plan:   Informed Consent: I have reviewed the patients History and Physical, chart, labs and discussed the procedure including the risks, benefits and alternatives for the proposed anesthesia with the patient or authorized representative who has indicated his/her understanding and acceptance.   Dental Advisory Given  Plan Discussed with: Anesthesiologist, CRNA and Surgeon  Anesthesia Plan Comments:         Anesthesia Quick Evaluation

## 2015-04-30 NOTE — Progress Notes (Signed)
Pt. admitted to unit, rm 239. Oriented to room, call bell, Ascom phones and staff. Bed in low position. Fall safety plan reviewed, contract signed and placed on wall, yellow non-skid socks in place, bed alarm on. Full assessment to Epic, pacing on monitor, incision clean, dry and intact, no complaints of pain. Will continue to monitor.

## 2015-04-30 NOTE — Interval H&P Note (Signed)
History and Physical Interval Note:  04/30/2015 11:37 AM  [name]  has presented today for surgery, with the diagnosis of BRADYCARDIA  The various methods of treatment have been discussed with the patient and family. After consideration of risks, benefits and other options for treatment, the patient has consented to  Procedure(s): INSERTION PACEMAKER (N/A) as a surgical intervention .  The patient's history has been reviewed, patient examined, no change in status, stable for surgery.  I have reviewed the patient's chart and labs.  Questions were answered to the patient's satisfaction.     [name]

## 2015-04-30 NOTE — H&P (Signed)
Progress Notes   [name] (MR# [id number])        Progress Notes Info     Author Note Status Last Update User Last Update Date/Time Service Date   Sydnee Levans, MD Signed Sydnee Levans, MD Thu Apr 17, 2015 12:30 PM Thu Apr 17, 2015 12:23 PM    Progress Notes    Expand All Collapse All      Chief Complaint: Chief Complaint  Patient presents with  . Hospital Follow Up    had holter in er and heart rate was in the 30   Date of Service: 04/17/2015 Date of Birth: 1924-10-18 PCP: Birdie Sons, MD  History of Present Illness: Mr. [name] is a 81 y.o.male patient who presents for follow-up visit. Has a history of peripheral vascular disease status post lower extremity stenting with scheduled carotid end arterectomy. He also has chronic kidney disease . He recent was noted to have bradycardia in atrial fibrillation. Patient new [facility] for procedure is lightheadedness and dizziness. He is on no rate-related medications. Past Medical and Surgical History  Past Medical History Past Medical History  Diagnosis Date  . Hypertension   . GERD (gastroesophageal reflux disease)   . Hypothyroidism   . PVD (peripheral vascular disease)   . Hyperlipidemia   . Carotid bruit   . Aneurysm of artery of lower extremity   . Type 2 diabetes mellitus with circulatory disorder   . Chronic kidney disease     Creatinine 2.26  . Kidney disease   . Gout   . Peripheral neuropathy   . Atrial fibrillation     Past Surgical History He has past surgical history that includes Appendectomy and endoscopic ultrasound (N/A, 04/08/2015).   Medications and Allergies  Current Medications   Current Medications    Current Outpatient Prescriptions  Medication Sig Dispense Refill  . allopurinol (ZYLOPRIM) 100 MG tablet Take 1 tablet (100 mg total) by mouth once daily. (Patient taking differently: Take 200 mg by  mouth once daily. ) 30 tablet 11  . aspirin 81 MG EC tablet Take 81 mg by mouth once daily.    . bumetanide (BUMEX) 0.5 MG tablet 1-2 tablets prn daily     . clopidogrel (PLAVIX) 75 mg tablet Take 75 mg by mouth once daily.    . cod liver oil Oil Take 1,000 capsules by mouth 3 (three) times daily.    . cyanocobalamin (VITAMIN B12) 1,000 mcg/mL injection Inject into the muscle monthly.    . gabapentin (NEURONTIN) 300 MG capsule Take 1 capsule (300 mg total) by mouth 3 (three) times daily. (Patient taking differently: Take 900 mg by mouth nightly.  ) 90 capsule 11  . garlic 633 mg Tab Take 354 capsules by mouth once daily.    Marland Kitchen glipiZIDE (GLUCOTROL) 5 MG tablet Take 5 mg by mouth 2 (two) times daily before meals.    . INSULIN GLARGINE,HUM.REC.ANLOG (TOUJEO SOLOSTAR SUBQ) Inject 10 Units subcutaneously nightly.    . levothyroxine (SYNTHROID, LEVOTHROID) 112 MCG tablet Take 112 mcg by mouth once daily. Take on an empty stomach with a glass of water at least 30-60 minutes before breakfast.    . lisinopril (PRINIVIL,ZESTRIL) 10 MG tablet Take 5 mg by mouth once daily.      . montelukast (SINGULAIR) 10 mg tablet Take 10 mg by mouth nightly.    Marland Kitchen omega-3 fatty acids/fish oil 340-1,000 mg capsule Take 1 capsule by mouth 2 (two) times daily.    Marland Kitchen  oxyCODONE-acetaminophen (PERCOCET) 5-325 mg tablet Take by mouth.    . pravastatin (PRAVACHOL) 40 MG tablet Take 40 mg by mouth nightly.    . ranitidine (ZANTAC) 150 MG tablet Take 150 mg by mouth 2 (two) times daily as needed.      No current facility-administered medications for this visit.      Allergies: Penicillin and Rosuvastatin  Social and Family History  Social History  reports that he has never smoked. He does not have any smokeless tobacco history on file. He reports that he does not drink alcohol or use illicit drugs.  Family History Family History   Problem Relation Age of Onset  . Aneurysm Father     Brain    Review of Systems  Review of Systems  Constitutional: Positive for malaise/fatigue. Negative for fever, chills, weight loss and diaphoresis.  HENT: Negative for congestion, ear discharge, hearing loss and tinnitus.  Eyes: Negative for blurred vision.  Respiratory: Negative for cough, hemoptysis, sputum production, shortness of breath and wheezing.  Cardiovascular: Negative for chest pain, palpitations, orthopnea, claudication, leg swelling and PND.  Gastrointestinal: Negative for heartburn, nausea, vomiting, abdominal pain, diarrhea, constipation, blood in stool and melena.  Genitourinary: Negative for dysuria, urgency, frequency and hematuria.  Musculoskeletal: Negative for myalgias, back pain, joint pain and falls.  Skin: Negative for itching and rash.  Neurological: Negative for dizziness, tingling, focal weakness, loss of consciousness, weakness and headaches.  Endo/Heme/Allergies: Negative for polydipsia. Does not bruise/bleed easily.  Psychiatric/Behavioral: Negative for depression, memory loss and substance abuse. The patient is not nervous/anxious.    Physical Examination   Vitals:BP 140/60 mmHg  Pulse 52  Resp 12  Ht 182.9 cm (6')  Wt 89.359 kg (197 lb)  BMI 26.71 kg/m2 Ht:182.9 cm (6') Wt:89.359 kg (197 lb) TMH:DQQI surface area is 2.13 meters squared. Body mass index is 26.71 kg/(m^2).  Wt Readings from Last 3 Encounters:  04/17/15 89.359 kg (197 lb)  04/08/15 90.719 kg (200 lb)  02/07/15 97.523 kg (215 lb)    BP Readings from Last 3 Encounters:  04/17/15 140/60  04/08/15 160/75  04/08/15 137/64   General: Caucasian male in no acute distress LUNGS Breath Sounds: Normal Percussion: Normal  CARDIOVASCULAR JVP CV wave: no HJR: no Elevation at 90  degrees: None Carotid Pulse: normal pulsation bilaterally Bruit: carotid bilateral Apex: apical impulse normal  Auscultation Rhythm: atrial fibrillation S1: normal S2: normal Clicks: no Rub: no Murmurs: 2/6 medium pitched mid systolic blowing at apex  Gallop: None ABDOMEN Liver enlargement: no Pulsatile aorta: no Ascites: no Bruits: no  EXTREMITIES Clubbing: no Edema: 1+ bilateral pedal edema Pulses: peripheral pulses symmetrical and pedal pulses decreased or absent bilateral Femoral Bruits: yes Amputation: no SKIN Rash: no Cyanosis: no Embolic phemonenon: no Bruising: yes at cardiac catheterization site NEURO Alert and Oriented to person, place and time: yes Non focal: yes LABS Last 3 CBC results: Lab Results  Component Value Date   WBC 5.4 04/08/2015   WBC 7.4 07/26/2014   Lab Results  Component Value Date   HGB 14.4 04/08/2015   HGB 14.8 07/26/2014   Lab Results  Component Value Date   HCT 46.9 04/08/2015   HCT 45.1 07/26/2014    Lab Results  Component Value Date   PLT 133* 04/08/2015   PLT 156 07/26/2014    Lab Results  Component Value Date   CREATININE 1.9* 04/08/2015   BUN 25* 04/08/2015   NA 139 04/08/2015   K 4.1 04/08/2015   CL  96* 04/08/2015   CO2 32* 04/08/2015       Assessment and Plan   81 y.o. male with    ICD-10-CM ICD-9-CM   1. Aneurysm of artery of lower  extremity-currently doing fairly well with no evidence of progression of disease. Being followed by vascular surgery I72.4 442.3   2. Pure hypercholesterolemia-Will continue with pravastatin with an LDL goal of less than 100. Low-fat diet recommended E78.0 272.0   3. Essential hypertension-blood pressure is controlled with lisinopril. Will continue with this and a low-sodium dash diet I10 401.9   4. PVD (peripheral vascular disease)-being followed by vascular surgery. Will need to hold aspirin and Plavix for his permanent pacemaker. Will resume this after device is been placed. I73.9 443.9   5. Type 2 diabetes mellitus with other circulatory complications-continue with diet control as well as glipizide. Hemoglobin A1c goal of less than 6 E11.59 250.70   6. Bilateral carotid bruits-stable at present  R09.89 785.9    7. Atrial fibrillation-AFib has recent been noted with slow ventricular response. Patient's chads 2 Vasc score is 4. Will need to discuss consideration for chronic anticoagulation after pacemaker is placed. Risk and benefits of permanent pacemaker were explained to the patient and his daughter. Single chamber pacemaker is indicated for sick sinus syndrome with atrial fibrillation with pauses of up to 2.14 seconds.   Return in about 2 weeks (around 05/01/2015).   These notes generated with voice recognition software. I apologize for typographical errors.  Sydnee Levans, [facility]   [address] [place] [address]    Service Location    Name Address       [place] [address]      Department    Name Address Phone Fax   [facility] [place] [place] [address] [phone]

## 2015-04-30 NOTE — Transfer of Care (Signed)
Immediate Anesthesia Transfer of Care Note  Patient: [name]  Procedure(s) Performed: Procedure(s): INSERTION PACEMAKER (N/A)  Patient Location: PACU  Anesthesia Type:General  Level of Consciousness: awake, alert  and oriented  Airway & Oxygen Therapy: Patient Spontanous Breathing and Patient connected to face mask oxygen  Post-op Assessment: Report given to RN and Post -op Vital signs reviewed and stable  Post vital signs: Reviewed and stable  Last Vitals:  Filed Vitals:   04/30/15 1343  BP: 142/71  Pulse:   Temp: 36.4 C  Resp: 20    Complications: No apparent anesthesia complications

## 2015-04-30 NOTE — Progress Notes (Signed)
Patient c/o heart burn, says he takes Zantac at home. Dr. Saralyn Pilar paged, order for zantac received, but not offered from our pharmacy, so pepcid was recommended and ordered instead.

## 2015-05-01 DIAGNOSIS — R001 Bradycardia, unspecified: Secondary | ICD-10-CM | POA: Diagnosis not present

## 2015-05-01 MED ORDER — CEPHALEXIN 250 MG PO CAPS
250.0000 mg | ORAL_CAPSULE | Freq: Four times a day (QID) | ORAL | Status: DC
Start: 2015-05-01 — End: 2015-05-15

## 2015-05-01 NOTE — Discharge Instructions (Signed)
Do not lift left arm above head

## 2015-05-01 NOTE — Progress Notes (Signed)
Pt  In NAD, skin warm and dry, VSS, V paced per monitor.  Pt denies any pain or discomfort at this time.  IV and telemetry discontinued per policy and procedure.  Discharge instructions given to  and reviewed with patient and pts son, understanding verbalized.

## 2015-05-01 NOTE — Progress Notes (Signed)
Communicated with Dr. Jannifer Franklin in reference to patient's gabapentin. Informed Dr. Jannifer Franklin that patient stated that he takes his gabapentin 900 mg at night. Made Dr. Jannifer Franklin aware of the current order in [facility]... Gabapentin 300mg  three times a day. New order gabapentin 900mg  HS.

## 2015-05-02 ENCOUNTER — Telehealth: Payer: Self-pay

## 2015-05-02 NOTE — Telephone Encounter (Signed)
  Oncology Nurse Navigator Documentation    Navigator Encounter Type: Telephone (05/02/15 1400)               Spoke with Mr Leavell 8/18. Pacemaker placement went well. Will have pacemaker checked 8/23.Cardiologist to document final clearance on 8/23 after pacemaker check.  Last dose of plavix/asa for upcoming EUS 8/18. Instructions for EUS reviewed and mailed to home address.    INSTRUCTIONS FOR ENDOSCOPIC ULTRASOUND  -Your procedure has been scheduled for August 25th   -The hospital will contact you to pre-register over the phone. If for any reason you have not received a call within one week prior to your scheduled procedure date, please call [phone]. -To get your scheduled arrival time, please call the Endoscopy unit at  [phone] between 1-3pm on: August 25th  -ON THE DAY OF YOU PROCEDURE:  1. If you are scheduled for a morning procedure, nothing to drink after midnight  -If you are scheduled for an afternoon procedure, you may have clear liquids until 5 hours prior  to the procedure but no carbonated drinks or broth  2. NO FOOD THE DAY OF YOUR PROCEDURE  3. You may take your heart, seizure, blood pressure, Parkinson's or breathing medications at  6am with just enough water to get your pills down  4. Do not take Vitamins  -On the day of your procedure, come to the [facility] Admitting/Registration desk (First desk on the right) at the scheduled arrival time. You MUST have someone drive you home from your procedure. You must have a responsible adult with a valid drivers license who is on site throughout your entire procedure and who can stay with you for several hours after your procedure. You may not go home alone in a taxi, shuttle [place] or bus, as the drivers will not be responsible for you.

## 2015-05-02 NOTE — Anesthesia Postprocedure Evaluation (Signed)
  Anesthesia Post-op Note  Patient: [name]  Procedure(s) Performed: Procedure(s): INSERTION PACEMAKER (N/A)  Anesthesia type:General  Patient location: PACU  Post pain: Pain level controlled  Post assessment: Post-op Vital signs reviewed, Patient's Cardiovascular Status Stable, Respiratory Function Stable, Patent Airway and No signs of Nausea or vomiting  Post vital signs: Reviewed and stable  Last Vitals:  Filed Vitals:   05/01/15 0438  BP: 165/93  Pulse: 112  Temp: 36.3 C  Resp: 19    Level of consciousness: awake, alert  and patient cooperative  Complications: No apparent anesthesia complications

## 2015-05-08 ENCOUNTER — Ambulatory Visit: Payer: Medicare Other | Admitting: Anesthesiology

## 2015-05-08 ENCOUNTER — Observation Stay
Admission: AD | Admit: 2015-05-08 | Discharge: 2015-05-09 | Disposition: A | Discharge status: Home / Self Care | Payer: Medicare Other | Source: Ambulatory Visit | Attending: Internal Medicine | Admitting: Internal Medicine

## 2015-05-08 ENCOUNTER — Encounter
Admission: AD | Disposition: A | Discharge status: Home / Self Care | Payer: Self-pay | Source: Ambulatory Visit | Attending: Internal Medicine

## 2015-05-08 ENCOUNTER — Ambulatory Visit: Payer: Medicare Other

## 2015-05-08 ENCOUNTER — Encounter: Payer: Self-pay | Admitting: *Deleted

## 2015-05-08 DIAGNOSIS — K219 Gastro-esophageal reflux disease without esophagitis: Secondary | ICD-10-CM | POA: Insufficient documentation

## 2015-05-08 DIAGNOSIS — K589 Irritable bowel syndrome without diarrhea: Secondary | ICD-10-CM | POA: Insufficient documentation

## 2015-05-08 DIAGNOSIS — E1165 Type 2 diabetes mellitus with hyperglycemia: Secondary | ICD-10-CM | POA: Diagnosis not present

## 2015-05-08 DIAGNOSIS — I1310 Hypertensive heart and chronic kidney disease without heart failure, with stage 1 through stage 4 chronic kidney disease, or unspecified chronic kidney disease: Secondary | ICD-10-CM | POA: Diagnosis not present

## 2015-05-08 DIAGNOSIS — C252 Malignant neoplasm of tail of pancreas: Principal | ICD-10-CM | POA: Insufficient documentation

## 2015-05-08 DIAGNOSIS — E780 Pure hypercholesterolemia: Secondary | ICD-10-CM | POA: Insufficient documentation

## 2015-05-08 DIAGNOSIS — R001 Bradycardia, unspecified: Secondary | ICD-10-CM | POA: Diagnosis not present

## 2015-05-08 DIAGNOSIS — M545 Low back pain: Secondary | ICD-10-CM | POA: Insufficient documentation

## 2015-05-08 DIAGNOSIS — D510 Vitamin B12 deficiency anemia due to intrinsic factor deficiency: Secondary | ICD-10-CM | POA: Diagnosis not present

## 2015-05-08 DIAGNOSIS — D751 Secondary polycythemia: Secondary | ICD-10-CM | POA: Diagnosis not present

## 2015-05-08 DIAGNOSIS — M1990 Unspecified osteoarthritis, unspecified site: Secondary | ICD-10-CM | POA: Diagnosis not present

## 2015-05-08 DIAGNOSIS — Z880 Allergy status to penicillin: Secondary | ICD-10-CM | POA: Diagnosis not present

## 2015-05-08 DIAGNOSIS — M109 Gout, unspecified: Secondary | ICD-10-CM | POA: Insufficient documentation

## 2015-05-08 DIAGNOSIS — E039 Hypothyroidism, unspecified: Secondary | ICD-10-CM | POA: Insufficient documentation

## 2015-05-08 DIAGNOSIS — K255 Chronic or unspecified gastric ulcer with perforation: Secondary | ICD-10-CM | POA: Insufficient documentation

## 2015-05-08 DIAGNOSIS — Z91048 Other nonmedicinal substance allergy status: Secondary | ICD-10-CM | POA: Insufficient documentation

## 2015-05-08 DIAGNOSIS — I739 Peripheral vascular disease, unspecified: Secondary | ICD-10-CM | POA: Insufficient documentation

## 2015-05-08 DIAGNOSIS — I714 Abdominal aortic aneurysm, without rupture: Secondary | ICD-10-CM | POA: Diagnosis not present

## 2015-05-08 DIAGNOSIS — Z888 Allergy status to other drugs, medicaments and biological substances status: Secondary | ICD-10-CM | POA: Insufficient documentation

## 2015-05-08 DIAGNOSIS — N508 Other specified disorders of male genital organs: Secondary | ICD-10-CM | POA: Diagnosis not present

## 2015-05-08 DIAGNOSIS — R109 Unspecified abdominal pain: Secondary | ICD-10-CM

## 2015-05-08 DIAGNOSIS — K3189 Other diseases of stomach and duodenum: Secondary | ICD-10-CM

## 2015-05-08 DIAGNOSIS — N183 Chronic kidney disease, stage 3 (moderate): Secondary | ICD-10-CM | POA: Diagnosis not present

## 2015-05-08 DIAGNOSIS — R42 Dizziness and giddiness: Secondary | ICD-10-CM | POA: Insufficient documentation

## 2015-05-08 DIAGNOSIS — J9811 Atelectasis: Secondary | ICD-10-CM | POA: Insufficient documentation

## 2015-05-08 DIAGNOSIS — E1140 Type 2 diabetes mellitus with diabetic neuropathy, unspecified: Secondary | ICD-10-CM | POA: Insufficient documentation

## 2015-05-08 DIAGNOSIS — Z85828 Personal history of other malignant neoplasm of skin: Secondary | ICD-10-CM | POA: Insufficient documentation

## 2015-05-08 DIAGNOSIS — B351 Tinea unguium: Secondary | ICD-10-CM | POA: Insufficient documentation

## 2015-05-08 DIAGNOSIS — R1909 Other intra-abdominal and pelvic swelling, mass and lump: Secondary | ICD-10-CM | POA: Diagnosis present

## 2015-05-08 DIAGNOSIS — E1122 Type 2 diabetes mellitus with diabetic chronic kidney disease: Secondary | ICD-10-CM | POA: Insufficient documentation

## 2015-05-08 DIAGNOSIS — Y658 Other specified misadventures during surgical and medical care: Secondary | ICD-10-CM | POA: Diagnosis not present

## 2015-05-08 DIAGNOSIS — Z950 Presence of cardiac pacemaker: Secondary | ICD-10-CM | POA: Insufficient documentation

## 2015-05-08 DIAGNOSIS — K4090 Unilateral inguinal hernia, without obstruction or gangrene, not specified as recurrent: Secondary | ICD-10-CM | POA: Insufficient documentation

## 2015-05-08 DIAGNOSIS — K5900 Constipation, unspecified: Secondary | ICD-10-CM | POA: Diagnosis not present

## 2015-05-08 DIAGNOSIS — G4730 Sleep apnea, unspecified: Secondary | ICD-10-CM | POA: Diagnosis not present

## 2015-05-08 DIAGNOSIS — E1149 Type 2 diabetes mellitus with other diabetic neurological complication: Secondary | ICD-10-CM | POA: Insufficient documentation

## 2015-05-08 DIAGNOSIS — R1013 Epigastric pain: Secondary | ICD-10-CM | POA: Insufficient documentation

## 2015-05-08 DIAGNOSIS — K9181 Other intraoperative complications of digestive system: Secondary | ICD-10-CM | POA: Diagnosis not present

## 2015-05-08 DIAGNOSIS — I4891 Unspecified atrial fibrillation: Secondary | ICD-10-CM | POA: Insufficient documentation

## 2015-05-08 DIAGNOSIS — I495 Sick sinus syndrome: Secondary | ICD-10-CM | POA: Insufficient documentation

## 2015-05-08 DIAGNOSIS — Z794 Long term (current) use of insulin: Secondary | ICD-10-CM | POA: Insufficient documentation

## 2015-05-08 DIAGNOSIS — Z7982 Long term (current) use of aspirin: Secondary | ICD-10-CM | POA: Insufficient documentation

## 2015-05-08 HISTORY — PX: UPPER ESOPHAGEAL ENDOSCOPIC ULTRASOUND (EUS): SHX6562

## 2015-05-08 LAB — GLUCOSE, CAPILLARY
GLUCOSE-CAPILLARY: 106 mg/dL — AB (ref 65–99)
GLUCOSE-CAPILLARY: 142 mg/dL — AB (ref 65–99)

## 2015-05-08 SURGERY — UPPER ESOPHAGEAL ENDOSCOPIC ULTRASOUND (EUS)
Anesthesia: General

## 2015-05-08 MED ORDER — ACETAMINOPHEN 325 MG PO TABS: 650.0000 mg | ORAL_TABLET | Freq: Four times a day (QID) | ORAL | Status: DC | PRN

## 2015-05-08 MED ORDER — LEVOTHYROXINE SODIUM 112 MCG PO TABS
112.0000 ug | ORAL_TABLET | Freq: Every day | ORAL | Status: DC
  Administered 2015-05-09: 112 ug via ORAL
  Filled 2015-05-08: qty 1

## 2015-05-08 MED ORDER — GABAPENTIN 300 MG PO CAPS
900.0000 mg | ORAL_CAPSULE | Freq: Every day | ORAL | Status: DC
  Administered 2015-05-08: 900 mg via ORAL
  Filled 2015-05-08 (×2): qty 3

## 2015-05-08 MED ORDER — LIDOCAINE HCL (CARDIAC) 20 MG/ML IV SOLN
INTRAVENOUS | Status: DC | PRN
  Administered 2015-05-08: 30 mg via INTRAVENOUS

## 2015-05-08 MED ORDER — SODIUM CHLORIDE 0.9 % IV SOLN
INTRAVENOUS | Status: DC
  Administered 2015-05-08: 14:00:00 via INTRAVENOUS

## 2015-05-08 MED ORDER — PROPOFOL INFUSION 10 MG/ML OPTIME
INTRAVENOUS | Status: DC | PRN
  Administered 2015-05-08: 100 ug/kg/min via INTRAVENOUS

## 2015-05-08 MED ORDER — ACETAMINOPHEN 650 MG RE SUPP: 650.0000 mg | Freq: Four times a day (QID) | RECTAL | Status: DC | PRN

## 2015-05-08 MED ORDER — PRAVASTATIN SODIUM 40 MG PO TABS
40.0000 mg | ORAL_TABLET | Freq: Every day | ORAL | Status: DC
  Administered 2015-05-08: 40 mg via ORAL
  Filled 2015-05-08 (×2): qty 1

## 2015-05-08 MED ORDER — OMEGA-3-ACID ETHYL ESTERS 1 G PO CAPS
1.0000 g | ORAL_CAPSULE | Freq: Two times a day (BID) | ORAL | Status: DC
  Administered 2015-05-08: 1 g via ORAL
  Filled 2015-05-08 (×2): qty 1

## 2015-05-08 MED ORDER — INSULIN ASPART 100 UNIT/ML ~~LOC~~ SOLN
0.0000 [IU] | Freq: Three times a day (TID) | SUBCUTANEOUS | Status: DC
Start: 2015-05-09 — End: 2015-05-09
  Administered 2015-05-09: 1 [IU] via SUBCUTANEOUS

## 2015-05-08 MED ORDER — IOHEXOL 240 MG/ML SOLN: 25.0000 mL | Freq: Once | INTRAMUSCULAR | Status: DC | PRN

## 2015-05-08 MED ORDER — FENTANYL CITRATE (PF) 100 MCG/2ML IJ SOLN
INTRAMUSCULAR | Status: DC | PRN
  Administered 2015-05-08: 50 ug via INTRAVENOUS

## 2015-05-08 MED ORDER — CEPHALEXIN 250 MG PO CAPS
250.0000 mg | ORAL_CAPSULE | Freq: Four times a day (QID) | ORAL | Status: DC
  Administered 2015-05-08 – 2015-05-09 (×2): 250 mg via ORAL
  Filled 2015-05-08 (×5): qty 1

## 2015-05-08 MED ORDER — MIDAZOLAM HCL 2 MG/2ML IJ SOLN
INTRAMUSCULAR | Status: DC | PRN
  Administered 2015-05-08: 1 mg via INTRAVENOUS

## 2015-05-08 MED ORDER — ASPIRIN EC 81 MG PO TBEC: 81.0000 mg | DELAYED_RELEASE_TABLET | Freq: Every day | ORAL | Status: DC

## 2015-05-08 MED ORDER — FENTANYL 12 MCG/HR TD PT72: 12.5000 ug | MEDICATED_PATCH | TRANSDERMAL | Status: DC

## 2015-05-08 MED ORDER — FAMOTIDINE 10 MG PO TABS
10.0000 mg | ORAL_TABLET | Freq: Every day | ORAL | Status: DC
  Filled 2015-05-08 (×2): qty 1

## 2015-05-08 MED ORDER — GARLIC 1000 MG PO CAPS: ORAL_CAPSULE | Freq: Every day | ORAL | Status: DC

## 2015-05-08 MED ORDER — ALLOPURINOL 100 MG PO TABS
100.0000 mg | ORAL_TABLET | Freq: Two times a day (BID) | ORAL | Status: DC
  Administered 2015-05-09: 100 mg via ORAL
  Filled 2015-05-08: qty 1

## 2015-05-08 MED ORDER — OXYCODONE-ACETAMINOPHEN 5-325 MG PO TABS: 1.0000 | ORAL_TABLET | Freq: Three times a day (TID) | ORAL | Status: DC | PRN

## 2015-05-08 MED ORDER — INSULIN GLARGINE 100 UNIT/ML ~~LOC~~ SOLN
10.0000 [IU] | Freq: Every day | SUBCUTANEOUS | Status: DC
  Filled 2015-05-08 (×2): qty 0.1

## 2015-05-08 MED ORDER — LISINOPRIL 5 MG PO TABS
5.0000 mg | ORAL_TABLET | Freq: Every day | ORAL | Status: DC
  Administered 2015-05-09: 5 mg via ORAL
  Filled 2015-05-08: qty 1

## 2015-05-08 MED ORDER — SODIUM CHLORIDE 0.9 % IV SOLN
INTRAVENOUS | Status: DC
  Administered 2015-05-09: 03:00:00 via INTRAVENOUS

## 2015-05-08 MED ORDER — BUMETANIDE 2 MG PO TABS
2.0000 mg | ORAL_TABLET | Freq: Every day | ORAL | Status: DC
  Filled 2015-05-08: qty 1

## 2015-05-08 MED ORDER — INSULIN ASPART 100 UNIT/ML ~~LOC~~ SOLN
0.0000 [IU] | Freq: Every day | SUBCUTANEOUS | Status: DC
  Filled 2015-05-08: qty 1

## 2015-05-08 MED ORDER — CIPROFLOXACIN IN D5W 400 MG/200ML IV SOLN
400.0000 mg | Freq: Two times a day (BID) | INTRAVENOUS | Status: DC
  Administered 2015-05-08: 400 mg via INTRAVENOUS
  Filled 2015-05-08 (×5): qty 200

## 2015-05-08 MED ORDER — MONTELUKAST SODIUM 10 MG PO TABS
10.0000 mg | ORAL_TABLET | Freq: Every day | ORAL | Status: DC
  Administered 2015-05-08: 10 mg via ORAL
  Filled 2015-05-08: qty 1

## 2015-05-08 MED ORDER — MIDAZOLAM HCL 2 MG/2ML IJ SOLN: INTRAMUSCULAR | Status: DC | PRN

## 2015-05-08 NOTE — H&P (Signed)
[facility] [place] NAME: [name]    MR#:  [id number]  DATE OF BIRTH:  26-Dec-1924  DATE OF ADMISSION:  05/08/2015  PRIMARY CARE PHYSICIAN: Lelon Huh, MD   REQUESTING/REFERRING PHYSICIAN: Dr. Gustavo Lah  CHIEF COMPLAINT:  No chief complaint on file.  monitoring the patient status post EGD.  HISTORY OF PRESENT ILLNESS:  [name]  is a 81 y.o. male with a known history of hypertension, PAD, hypothyroidism, gout, pancreatic cancer, recent pacemaker placement came to have endoscopy. Patient had an endoscopic , ultrasound by Elspeth Cho during the endoscopy patient to had the small mucosal tear in the cardia after passing the endoscope. Patient will tear was not full-thickness, for hemostatic clips were placed. Concerning the mucosal tear patient had a CT of abdomen which did not show any perforation. But GI doctor Dr. Gustavo Lah recommends overnight observation to make sure he will not develop any perforation, possible  discharge him tomorrow. He recommends continuing clear liquids tonight and tomorrow he can take full liquids. He denies any abdominal pain. No nausea.  PAST MEDICAL HISTORY:   Past Medical History  Diagnosis Date  . Diabetes mellitus without complication   . Hypertension   . PAD (peripheral artery disease)   . Gout   . Hypothyroidism   . History of chicken pox   . History of measles as a child   . History of mumps as a child   . SSS (sick sinus syndrome)   . Chronic kidney disease     ckd  . GERD (gastroesophageal reflux disease)   . Neuropathy   . Dysrhythmia     a fib  . Cancer     Skin cancer  . Pancreatic mass 04/2015    scheduled for biopsy on 05/08/2015    PAST SURGICAL HISTOIRY:   Past Surgical History  Procedure Laterality Date  . Carotid endarterectomy Right 2015    Dr. Lucky Cowboy  . Heart stents  2015    one stent per patient  . Bilateral leg stents Bilateral 2015    Bilateral iliac and  right popliteal  . Skin cancer excision    . Cataract extraction Bilateral 09/2013  . Angioplasty  2009, 2010    done by Dr. Hulda Humphrey and Dr. Dian Situ  . Eye surgery    . Coronary angioplasty  2015  . Appendectomy  1962  . Pacemaker insertion N/A 04/30/2015    Procedure: INSERTION PACEMAKER;  Surgeon: Isaias Cowman, MD;  Location: [facility] ORS;  Service: Cardiovascular;  Laterality: N/A;    SOCIAL HISTORY:   Social History  Substance Use Topics  . Smoking status: Never Smoker   . Smokeless tobacco: Never Used  . Alcohol Use: No    FAMILY HISTORY:   Family History  Problem Relation Age of Onset  . Diabetes Mother   . Cancer Sister     Uterine  . Arthritis Father   . Kidney disease Father     DRUG ALLERGIES:   Allergies  Allergen Reactions  . Adhesive [Tape] Other (See Comments)    Patient states that adhesive tape causes his skin to tear very easily and asks for paper tape ONLY  . Crestor [Rosuvastatin Calcium] Rash  . Penicillins Rash    REVIEW OF SYSTEMS:  CONSTITUTIONAL: No fever, fatigue or weakness.  EYES: No blurred or double vision.  EARS, NOSE, AND THROAT: No tinnitus or ear pain.  RESPIRATORY: No cough, shortness of breath, wheezing or hemoptysis.  CARDIOVASCULAR: No chest pain, orthopnea, edema.  GASTROINTESTINAL: No nausea, vomiting, diarrhea or abdominal pain.  GENITOURINARY: No dysuria, hematuria.  ENDOCRINE: No polyuria, nocturia,  HEMATOLOGY: No anemia, easy bruising or bleeding SKIN: No rash or lesion. MUSCULOSKELETAL: No joint pain or arthritis.   NEUROLOGIC: No tingling, numbness, weakness.  PSYCHIATRY: No anxiety or depression.   MEDICATIONS AT HOME:   Prior to Admission medications   Medication Sig Start Date End Date Taking? Authorizing Provider  allopurinol (ZYLOPRIM) 100 MG tablet Take 100 mg by mouth 2 (two) times daily.   Yes Historical Provider, MD  bumetanide (BUMEX) 2 MG tablet Take 2 mg by mouth daily. 1 or 2 tablets AS NEEDED   Yes  Historical Provider, MD  gabapentin (NEURONTIN) 300 MG capsule Take 3 capsules (900 mg total) by mouth at bedtime. 04/15/15  Yes Birdie Sons, MD  Garlic 3149 MG CAPS Take by mouth daily.   Yes Historical Provider, MD  glipiZIDE (GLUCOTROL) 5 MG tablet Take by mouth 2 (two) times daily before a meal.   Yes Historical Provider, MD  GLIPIZIDE XL 5 MG 24 hr tablet TAKE 1-2 TABLETS BY MOUTH DAILY 04/18/15  Yes Birdie Sons, MD  levothyroxine (SYNTHROID, LEVOTHROID) 112 MCG tablet Take 112 mcg by mouth daily before breakfast.   Yes Historical Provider, MD  lisinopril (PRINIVIL,ZESTRIL) 10 MG tablet Take 5 mg by mouth daily.    Yes Historical Provider, MD  montelukast (SINGULAIR) 10 MG tablet Take 10 mg by mouth at bedtime.   Yes Historical Provider, MD  pravastatin (PRAVACHOL) 40 MG tablet Take 40 mg by mouth daily.   Yes Historical Provider, MD  ranitidine (ZANTAC) 150 MG tablet Take 150 mg by mouth 2 (two) times daily. AS NEEDED   Yes Historical Provider, MD  vitamin B-12 (CYANOCOBALAMIN) 1000 MCG tablet Inject 1,000 mcg into the muscle every 30 (thirty) days.   Yes Historical Provider, MD  aspirin EC 81 MG tablet Take 81 mg by mouth daily.    Historical Provider, MD  cephALEXin (KEFLEX) 250 MG capsule Take 1 capsule (250 mg total) by mouth 4 (four) times daily. 05/01/15   Isaias Cowman, MD  clopidogrel (PLAVIX) 75 MG tablet Take 1 tablet (75 mg total) by mouth daily. 04/15/15   Birdie Sons, MD  COD LIVER OIL PO  12/27/08   Historical Provider, MD  fentaNYL (DURAGESIC - DOSED MCG/HR) 12 MCG/HR Place 1 patch (12.5 mcg total) onto the skin every 3 (three) days. Patient not taking: Reported on 04/30/2015 04/14/15   Lloyd Huger, MD  glucose blood test strip 1 each daily. 08/27/14   Historical Provider, MD  Insulin Glargine (TOUJEO SOLOSTAR) 300 UNIT/ML SOPN Inject 10 Units into the skin daily. 01/21/15   Historical Provider, MD  Omega-3 Fatty Acids (FISH OIL) 1000 MG CAPS Take by mouth 2 (two)  times daily.    Historical Provider, MD  oxyCODONE-acetaminophen (PERCOCET/ROXICET) 5-325 MG per tablet Take 1 tablet by mouth every 8 (eight) hours as needed for moderate pain. Patient not taking: Reported on 04/30/2015 04/04/15   Birdie Sons, MD  silver sulfADIAZINE (SILVADENE) 1 % cream Apply 1 application topically 2 (two) times daily. to affected area 01/10/15   Historical Provider, MD      VITAL SIGNS:  Blood pressure 140/90, pulse 84, temperature 96.8 F (36 C), temperature source Tympanic, resp. rate 20, SpO2 93 %.  PHYSICAL EXAMINATION:  GENERAL:  81 y.o.-year-old patient lying in the bed with no acute distress.  EYES: Pupils equal, round, reactive to light and accommodation. No scleral icterus. Extraocular muscles intact.  HEENT: Head atraumatic, normocephalic. Oropharynx and nasopharynx clear.  NECK:  Supple, no jugular venous distention. No thyroid enlargement, no tenderness.  LUNGS: Normal breath sounds bilaterally, no wheezing, rales,rhonchi or crepitation. No use of accessory muscles of respiration.  CARDIOVASCULAR: S1, S2 normal. No murmurs, rubs, or gallops.  ABDOMEN: Soft, nontender, nondistended. Bowel sounds present. No organomegaly or mass.  EXTREMITIES: No pedal edema, cyanosis, or clubbing.  NEUROLOGIC: Cranial nerves II through XII are intact. Muscle strength 5/5 in all extremities. Sensation intact. Gait not checked.  PSYCHIATRIC: The patient is alert and oriented x 3.  SKIN: No obvious rash, lesion, or ulcer.   LABORATORY PANEL:   CBC No results for input(s): WBC, HGB, HCT, PLT in the last 168 hours. ------------------------------------------------------------------------------------------------------------------  Chemistries  No results for input(s): NA, K, CL, CO2, GLUCOSE, BUN, CREATININE, CALCIUM, MG, AST, ALT, ALKPHOS, BILITOT in the last 168 hours.  Invalid input(s):  GFRCGP ------------------------------------------------------------------------------------------------------------------  Cardiac Enzymes No results for input(s): TROPONINI in the last 168 hours. ------------------------------------------------------------------------------------------------------------------  RADIOLOGY:  Ct Abdomen Pelvis Wo Contrast  05/08/2015   CLINICAL DATA:  Status post EUS, abdominal pain, evaluate for perforation at cardia of stomach  EXAM: CT ABDOMEN AND PELVIS WITHOUT CONTRAST  TECHNIQUE: Multidetector CT imaging of the abdomen and pelvis was performed following the standard protocol without IV contrast.  COMPARISON:  PET-CT dated 03/31/2015. CT abdomen pelvis dated 03/14/2015.  FINDINGS: Lower chest:  Mild dependent atelectasis the lung bases.  Cardiomegaly.  Pacemaker leads, incompletely visualized.  Hepatobiliary: Unenhanced liver is grossly unremarkable.  Gallbladder is unremarkable. No intrahepatic or extrahepatic ductal dilatation.  Pancreas: 6.0 x 5.9 cm mass in the distal pancreatic body/ tail (series 2/ image 34), poorly evaluated on unenhanced CT, corresponding to suspected primary pancreatic neoplasm.  Mass tethers/involves the posterior wall of the stomach (series 2/image 30) and also tethers/involves the descending colon (series 2/image 31).  Mass abuts the celiac axis (series 2/ image 34). Splenic vein is occluded with collateralization. Mass abuts the SMV (series 2/image 37).  Spleen: Within normal limits.  Adrenals/Urinary Tract: Adrenal glands are unremarkable.  Left renal atrophy.  Right kidney is unremarkable.  No renal, ureteral, or bladder calculi.  Bladder is within normal limits.  Stomach/Bowel: Contrast within the stomach. Surgical clips in the region of the gastric cardia (series 2/ image 24). No extraluminal contrast or free air to suggest gastric perforation.  No evidence of bowel obstruction.  Colonic diverticulosis, without evidence of diverticulitis.   Vascular/Lymphatic: Atherosclerotic calcifications of the abdominal aorta and branch vessels.  3.6 x 3.5 cm infrarenal abdominal aortic aneurysm (series 2/ image 57).  No suspicious abdominopelvic lymphadenopathy.  Reproductive: Prostate is notable for dystrophic calcifications.  Other: No abdominopelvic ascites.  Small fat containing right inguinal hernia.  Musculoskeletal: Degenerative changes of the visualized thoracolumbar spine.  IMPRESSION: Surgical clips in the region of the gastric cardia. No extraluminal contrast or free air to suggest gastric perforation.  Stable distal pancreatic mass, corresponding to suspected primary pancreatic neoplasm. Secondary involvement of the posterior stomach and adjacent descending colon. Suspected vascular involvement, as above.  Stable 3.6 cm infrarenal abdominal aortic aneurysm.  Additional ancillary findings as above.   Electronically Signed   By: Julian Hy M.D.   On: 05/08/2015 17:13    EKG:   Orders placed or performed during the hospital encounter of 04/30/15  . EKG 12-Lead in am (before 8am)  . EKG  12-Lead in am (before 8am)    IMPRESSION AND PLAN:   #1;Mucosal tear of stomach;s/p repair, medical observation overnight for  Perforation ;CT and did not show any perforation. Continue clear liquids as suggested by GI. #2 history of recent pacemaker placement for bradycardia. Patient is on prophylactic cephalexin. I would the increase gram-negative coverage, start him on Levaquin. #3 history of peripheral artery disease. Hold aspirin, Plavix secondary to recent gastric tear status post repair. Discussed this Dr. Gustavo Lah  4. pancreatic cancer further management as per oncology.had EUS today  For eval of pancreatic mass. #5 history of diabetes mellitus hold the glipizide at this time secondary to   Inadequate po intake, . Wecan start the sliding scale coverage. #6 hypertension 7.Hypothyroidism; #8 diabetic neuropathy  All the records are  reviewed and case discussed with ED provider. Management plans discussed with the patient, family and they are in agreement.  CODE STATUS: full  TOTAL TIME TAKING CARE OF THIS PATIENT: 55 minutes.    Epifanio Lesches M.D on 05/08/2015 at 6:24 PM  Between 7am to 6pm - Pager - [phone]  After 6pm go to [email or web address] - password [email or web address] [facility]  Office  [phone]  CC: Primary care physician; Lelon Huh, MD

## 2015-05-08 NOTE — Anesthesia Preprocedure Evaluation (Addendum)
Anesthesia Evaluation  Patient identified by MRN, date of birth, ID band Patient awake    Reviewed: Allergy & Precautions, NPO status , Patient's Chart, lab work & pertinent test results, reviewed documented beta blocker date and time   Airway Mallampati: II  TM Distance: >3 FB     Dental  (+) Edentulous Lower, Upper Dentures   Pulmonary sleep apnea , COPD         Cardiovascular hypertension, Pt. on medications + Peripheral Vascular Disease and +CHF + dysrhythmias Atrial Fibrillation     Neuro/Psych    GI/Hepatic GERD-  ,  Endo/Other  diabetes, Type 2Hypothyroidism   Renal/GU Renal disease     Musculoskeletal  (+) Arthritis -,   Abdominal   Peds  Hematology  (+) anemia ,   Anesthesia Other Findings Gout. Pt denies sleep apnea. IBS. Pacemaker. Family denies sleep apnea. Has never used duragesic patch.  Reproductive/Obstetrics                            Anesthesia Physical Anesthesia Plan  ASA: III  Anesthesia Plan: General   Post-op Pain Management:    Induction:   Airway Management Planned: Nasal Cannula  Additional Equipment:   Intra-op Plan:   Post-operative Plan:   Informed Consent: I have reviewed the patients History and Physical, chart, labs and discussed the procedure including the risks, benefits and alternatives for the proposed anesthesia with the patient or authorized representative who has indicated his/her understanding and acceptance.     Plan Discussed with: CRNA  Anesthesia Plan Comments:         Anesthesia Quick Evaluation

## 2015-05-08 NOTE — Consult Note (Signed)
GI Inpatient Consult Note Lollie Sails MD  Reason for Consult: Gastric mucosal injury during and endoscopic procedure   Attending Requesting Consult: Dr. Vida Roller, Dr. Epifanio Lesches  Outpatient Primary Physician: Dr. Lelon Huh  History of Present Illness: [name] is a 81 y.o. male who came to the endoscopy unit today for an endoscopic ultrasound. The ultrasound was done in regards to findings of pancreatic mass. The procedure had been done and the scope was being withdrawn by the physician and was noted that there was a substantial tear in the lining of the cardia. It did not appear to be full-thickness. This mucosal injury was closed with several clips. Subsequently a CT scan was done showed no evidence of perforation. He was admitted for observation.  She has a history of pancreatitis that initially began about a year ago. He was admitted and came to the hospital on several different occasions in this regard. I fourth he had a CT scan during one of these admission showing a mass in the tail of the pancreas. He went to [facility] where a endoscopic ultrasound was attempted however he had a episode of bradycardia requiring placement of a pacemaker. He had return to [facility] completion of the disc endoscopic ultrasound today with biopsies. There was noted some unusual orientation of the stomach.  She has had no pain discomfort or nausea in regards to these findings today.  Patient states that he has no problems with heartburn or dysphagia. He does take a daily Zantac daily. He does have intermittent abdominal pain in the epigastric region this occurring in regards to his pancreatitis history. Usually has a daily bowel movement no evidence of blood, black, or mucus in the stool. He has noted some increased flatus recently. There is also been some increased constipation recently for which she is taking milk of magnesia and a stool softener.  These have been effective for him to maintain a daily bowel movement.  Past Medical History:  Past Medical History  Diagnosis Date  . Diabetes mellitus without complication   . Hypertension   . PAD (peripheral artery disease)   . Gout   . Hypothyroidism   . History of chicken pox   . History of measles as a child   . History of mumps as a child   . SSS (sick sinus syndrome)   . Chronic kidney disease     ckd  . GERD (gastroesophageal reflux disease)   . Neuropathy   . Dysrhythmia     a fib  . Cancer     Skin cancer  . Pancreatic mass 04/2015    scheduled for biopsy on 05/08/2015    Problem List: Patient Active Problem List   Diagnosis Date Noted  . Abdominal pain 05/08/2015  . Status post cardiac pacemaker procedure 04/30/2015  . Bradycardia 04/10/2015  . Dizziness 04/10/2015  . Arthritis 04/09/2015  . Bilateral carotid artery disease 04/09/2015  . Chronic kidney disease (CKD), stage III (moderate) 04/09/2015  . Edema 04/09/2015  . History of pancreatitis 04/09/2015  . Irritable colon 04/09/2015  . Onychomycosis 04/09/2015  . Skin ulcer 04/09/2015  . Testicular mass 04/09/2015  . Pancreatic mass 03/15/2015  . Neuropathy 02/17/2010  . LBP (low back pain) 05/20/2009  . Gouty arthropathy 01/01/2009  . Adult hypothyroidism 01/01/2009  . Pernicious anemia 12/27/2008  . B-complex deficiency 12/27/2008  . Congestive heart failure 12/27/2008  . Esophageal reflux 12/27/2008  . Apnea, sleep 12/27/2008  . Diabetes  mellitus with neurological manifestations, uncontrolled 09/13/2008  . Atrial fibrillation 09/14/2007  . Peripheral vascular disease 09/14/2007  . Polycythemia, secondary 09/14/2007  . Essential (primary) hypertension 01/31/2007  . Hypercholesteremia 01/31/2007    Past Surgical History: Past Surgical History  Procedure Laterality Date  . Carotid endarterectomy Right 2015    Dr. Lucky Cowboy  . Heart stents  2015    one stent per patient  . Bilateral leg stents  Bilateral 2015    Bilateral iliac and right popliteal  . Skin cancer excision    . Cataract extraction Bilateral 09/2013  . Angioplasty  2009, 2010    done by Dr. Hulda Humphrey and Dr. Dian Situ  . Eye surgery    . Coronary angioplasty  2015  . Appendectomy  1962  . Pacemaker insertion N/A 04/30/2015    Procedure: INSERTION PACEMAKER;  Surgeon: Isaias Cowman, MD;  Location: [facility] ORS;  Service: Cardiovascular;  Laterality: N/A;    Allergies: Allergies  Allergen Reactions  . Adhesive [Tape] Other (See Comments)    Patient states that adhesive tape causes his skin to tear very easily and asks for paper tape ONLY  . Crestor [Rosuvastatin Calcium] Rash  . Penicillins Rash    Home Medications: Prescriptions prior to admission  Medication Sig Dispense Refill Last Dose  . allopurinol (ZYLOPRIM) 100 MG tablet Take 100 mg by mouth 2 (two) times daily.   05/08/2015 at Unknown time  . bumetanide (BUMEX) 2 MG tablet Take 2 mg by mouth daily. 1 or 2 tablets AS NEEDED   05/07/2015 at Unknown time  . gabapentin (NEURONTIN) 300 MG capsule Take 3 capsules (900 mg total) by mouth at bedtime. 90 capsule 5 05/07/2015 at Unknown time  . Garlic 8453 MG CAPS Take by mouth daily.   Past Week at Unknown time  . glipiZIDE (GLUCOTROL) 5 MG tablet Take by mouth 2 (two) times daily before a meal.   05/07/2015 at Unknown time  . GLIPIZIDE XL 5 MG 24 hr tablet TAKE 1-2 TABLETS BY MOUTH DAILY 60 tablet 12 05/07/2015 at Unknown time  . levothyroxine (SYNTHROID, LEVOTHROID) 112 MCG tablet Take 112 mcg by mouth daily before breakfast.   05/08/2015 at Unknown time  . lisinopril (PRINIVIL,ZESTRIL) 10 MG tablet Take 5 mg by mouth daily.    05/08/2015 at Unknown time  . montelukast (SINGULAIR) 10 MG tablet Take 10 mg by mouth at bedtime.   05/07/2015 at Unknown time  . pravastatin (PRAVACHOL) 40 MG tablet Take 40 mg by mouth daily.   05/07/2015 at Unknown time  . ranitidine (ZANTAC) 150 MG tablet Take 150 mg by mouth 2 (two) times daily.  AS NEEDED   05/07/2015 at Unknown time  . vitamin B-12 (CYANOCOBALAMIN) 1000 MCG tablet Inject 1,000 mcg into the muscle every 30 (thirty) days.   Past Week at Unknown time  . aspirin EC 81 MG tablet Take 81 mg by mouth daily.   04/24/2015  . cephALEXin (KEFLEX) 250 MG capsule Take 1 capsule (250 mg total) by mouth 4 (four) times daily. 28 capsule 0   . clopidogrel (PLAVIX) 75 MG tablet Take 1 tablet (75 mg total) by mouth daily. 30 tablet 5 04/24/2015  . COD LIVER OIL PO    04/24/2015  . fentaNYL (DURAGESIC - DOSED MCG/HR) 12 MCG/HR Place 1 patch (12.5 mcg total) onto the skin every 3 (three) days. (Patient not taking: Reported on 04/30/2015) 10 patch 0 Not Taking at Unknown time  . glucose blood test strip 1 each daily.  04/30/2015 at 0800  . Insulin Glargine (TOUJEO SOLOSTAR) 300 UNIT/ML SOPN Inject 10 Units into the skin daily.   04/29/2015 at 1700  . Omega-3 Fatty Acids (FISH OIL) 1000 MG CAPS Take by mouth 2 (two) times daily.   04/24/2015  . oxyCODONE-acetaminophen (PERCOCET/ROXICET) 5-325 MG per tablet Take 1 tablet by mouth every 8 (eight) hours as needed for moderate pain. (Patient not taking: Reported on 04/30/2015) 30 tablet 0 Not Taking at Unknown time  . silver sulfADIAZINE (SILVADENE) 1 % cream Apply 1 application topically 2 (two) times daily. to affected area   04/29/2015 at 1700   Home medication reconciliation was completed with the patient.   Scheduled Inpatient Medications:   . allopurinol  100 mg Oral BID  . aspirin EC  81 mg Oral Daily  . [START ON 05/09/2015] bumetanide  2 mg Oral Daily  . cephALEXin  250 mg Oral QID  . famotidine  10 mg Oral Daily  . fentaNYL  12.5 mcg Transdermal Q72H  . gabapentin  900 mg Oral QHS  . insulin aspart  0-5 Units Subcutaneous QHS  . [START ON 05/09/2015] insulin aspart  0-9 Units Subcutaneous TID WC  . Insulin Glargine  10 Units Subcutaneous Daily  . [START ON 05/09/2015] levothyroxine  112 mcg Oral QAC breakfast  . [START ON 05/09/2015]  lisinopril  5 mg Oral Daily  . montelukast  10 mg Oral QHS  . omega-3 acid ethyl esters  1 g Oral BID  . pravastatin  40 mg Oral Daily    Continuous Inpatient Infusions:   . sodium chloride      PRN Inpatient Medications:  acetaminophen **OR** acetaminophen, iohexol, oxyCODONE-acetaminophen  Family History: family history includes Arthritis in his father; Cancer in his sister; Diabetes in his mother; Kidney disease in his father.   GI Family History: Noncontributory  Social History:   reports that he has never smoked. He has never used smokeless tobacco. He reports that he does not drink alcohol or use illicit drugs. The patient denies ETOH, tobacco, or drug use.   ROS  Review of Systems: 10 systems reviewed per admission history and physical agree with same. Recent change of bowel habits with increasing constipation. He lost about 15 pounds in the past month.  Physical Examination: BP 145/93 mmHg  Pulse 91  Temp(Src) 96.8 F (36 C) (Tympanic)  Resp 15  SpO2 94% Gen: Elderly-appearing male no acute distress HEENT: Normocephalic atraumatic eyes are anicteric. Patient is edentulous though he does wear a upper plate Neck: No JVD, supple Chest: Clear to auscultation CV: Regular rate and rhythm without rub or gallop Abd: Soft nontender nondistended bowel sounds positive normoactive Ext: No clubbing cyanosis or edema Skin: Other:  Data: Lab Results  Component Value Date   WBC 7.2 04/24/2015   HGB 14.4 04/24/2015   HCT 44.8 04/24/2015   MCV 95.4 04/24/2015   PLT 120* 04/24/2015   No results for input(s): HGB in the last 168 hours. Lab Results  Component Value Date   NA 135 04/24/2015   K 4.3 04/24/2015   CL 96* 04/24/2015   CO2 30 04/24/2015   BUN 26* 04/24/2015   CREATININE 1.93* 04/24/2015   GLU 151 11/13/2014   Lab Results  Component Value Date   ALT 15* 03/14/2015   AST 25 03/14/2015   ALKPHOS 91 03/14/2015   BILITOT 0.5 03/14/2015   No results for  input(s): APTT, INR, PTT in the last 168 hours. CBC Latest Ref Rng 04/24/2015 03/14/2015  08/16/2014  WBC 3.8 - 10.6 K/uL 7.2 8.2 7.5  Hemoglobin 13.0 - 18.0 g/dL 14.4 14.9 13.0  Hematocrit 40.0 - 52.0 % 44.8 46.3 40.6  Platelets 150 - 440 K/uL 120(L) 145(L) 102(L)    STUDIES: Ct Abdomen Pelvis Wo Contrast  05/08/2015   CLINICAL DATA:  Status post EUS, abdominal pain, evaluate for perforation at cardia of stomach  EXAM: CT ABDOMEN AND PELVIS WITHOUT CONTRAST  TECHNIQUE: Multidetector CT imaging of the abdomen and pelvis was performed following the standard protocol without IV contrast.  COMPARISON:  PET-CT dated 03/31/2015. CT abdomen pelvis dated 03/14/2015.  FINDINGS: Lower chest:  Mild dependent atelectasis the lung bases.  Cardiomegaly.  Pacemaker leads, incompletely visualized.  Hepatobiliary: Unenhanced liver is grossly unremarkable.  Gallbladder is unremarkable. No intrahepatic or extrahepatic ductal dilatation.  Pancreas: 6.0 x 5.9 cm mass in the distal pancreatic body/ tail (series 2/ image 34), poorly evaluated on unenhanced CT, corresponding to suspected primary pancreatic neoplasm.  Mass tethers/involves the posterior wall of the stomach (series 2/image 30) and also tethers/involves the descending colon (series 2/image 31).  Mass abuts the celiac axis (series 2/ image 34). Splenic vein is occluded with collateralization. Mass abuts the SMV (series 2/image 37).  Spleen: Within normal limits.  Adrenals/Urinary Tract: Adrenal glands are unremarkable.  Left renal atrophy.  Right kidney is unremarkable.  No renal, ureteral, or bladder calculi.  Bladder is within normal limits.  Stomach/Bowel: Contrast within the stomach. Surgical clips in the region of the gastric cardia (series 2/ image 24). No extraluminal contrast or free air to suggest gastric perforation.  No evidence of bowel obstruction.  Colonic diverticulosis, without evidence of diverticulitis.  Vascular/Lymphatic: Atherosclerotic  calcifications of the abdominal aorta and branch vessels.  3.6 x 3.5 cm infrarenal abdominal aortic aneurysm (series 2/ image 57).  No suspicious abdominopelvic lymphadenopathy.  Reproductive: Prostate is notable for dystrophic calcifications.  Other: No abdominopelvic ascites.  Small fat containing right inguinal hernia.  Musculoskeletal: Degenerative changes of the visualized thoracolumbar spine.  IMPRESSION: Surgical clips in the region of the gastric cardia. No extraluminal contrast or free air to suggest gastric perforation.  Stable distal pancreatic mass, corresponding to suspected primary pancreatic neoplasm. Secondary involvement of the posterior stomach and adjacent descending colon. Suspected vascular involvement, as above.  Stable 3.6 cm infrarenal abdominal aortic aneurysm.  Additional ancillary findings as above.   Electronically Signed   By: Julian Hy M.D.   On: 05/08/2015 17:13   @IMAGES @  Assessment: 1. Pancreatic mass awaiting final diagnosis on biopsy from today. 2. Status post endoscopic ultrasound with complication as outlined above. No overt evidence of perforation.  Recommendations: 1. 23 hour observation. Clear liquid diet this evening. If he tolerates this would advance to a full liquid diet for tomorrow. Continue full liquid diet for at least 4-5 days prior to starting a soft diet. The clips that were placed or in the gastric cardia and solid food should not pass these at least several days. 2. Will follow with you. Thank you for your assistance  Thank you for the consult. Please call with questions or concerns.  Lollie Sails, MD  05/08/2015 7:26 PM

## 2015-05-08 NOTE — Anesthesia Procedure Notes (Signed)
Performed by: COOK-MARTIN, Jet Armbrust Pre-anesthesia Checklist: Patient identified, Emergency Drugs available, Suction available, Patient being monitored and Timeout performed Patient Re-evaluated:Patient Re-evaluated prior to inductionOxygen Delivery Method: Nasal cannula Preoxygenation: Pre-oxygenation with 100% oxygen Intubation Type: IV induction Airway Equipment and Method: Bite block Placement Confirmation: positive ETCO2 and CO2 detector     

## 2015-05-08 NOTE — Op Note (Signed)
[facility]astroenterology Patient Name: [name] Procedure Date: 05/08/2015 2:33 PM MRN: [id number] Account #: [id number] Date of Birth: 1925/08/13 Admit Type: Outpatient Age: 81 Room: [facility] ENDO ROOM 3 Gender: Male Note Status: Finalized Procedure:         Upper EUS Indications:       Suspected mass in pancreas on CT scan Providers:         Christian Mate. Earlie Counts Referring MD:      Kirstie Peri. Caryn Section, MD (Referring MD), Kathlene November. Grayland Ormond,                     MD (Referring MD) Medicines:         Monitored Anesthesia Care, Cipro 433 mg IV Complications:     No immediate complications. Procedure:         Pre-Anesthesia Assessment:                    - Prior to the procedure, a History and Physical was                     performed, and patient medications, allergies and                     sensitivities were reviewed. The patient's tolerance of                     previous anesthesia was reviewed.                    - The risks and benefits of the procedure and the sedation                     options and risks were discussed with the patient. All                     questions were answered and informed consent was obtained.                    After obtaining informed consent, the endoscope was passed                     under direct vision. Throughout the procedure, the                     patient's blood pressure, pulse, and oxygen saturations                     were monitored continuously. The EUS GI Linear Array                     [id number] was introduced through the mouth, and advanced to                     the second part of duodenum. The Endoscope was introduced                     through the mouth, and advanced to the second part of                     duodenum. The upper EUS was accomplished without                     difficulty. The patient tolerated the procedure well. Findings:      Endoscopic  Finding :      The examined esophagus was  endoscopically normal.      A small bleeding mucosal tear was found in the cardia, after passage of       the echoendoscope. This appeared to be a mucosal tear, not a full       thickness injury. Four hemostatic clips were successfully placed, with       closure of the defect. There was no bleeding at the end of the procedure.      Diffuse atrophic mucosa was found in the entire examined stomach.       Biopsies were taken with a cold forceps for Helicobacter pylori testing.      The examined duodenum was endoscopically normal.      Endosonographic Finding :      An irregular mass was identified in the pancreatic tail. The mass was       hypoechoic, heterogenous, lobulated and mixed solid and cystic. The mass       measured 49 mm by 39 mm in maximal cross-sectional diameter. The       endosonographic borders were poorly-defined. There was sonographic       evidence suggesting invasion into the stomach (manifested by abutment).       An intact interface was seen between the mass and the celiac trunk       suggesting a lack of invasion. Fine needle aspiration was performed.       Color Doppler imaging was utilized prior to needle puncture to confirm a       lack of significant vascular structures within the needle path. Three       passes were made with the 22 gauge needle using a transgastric approach.       A stylet was used. A cytologist was present and performed a preliminary       cytologic examination. The cellularity of the specimen was adequate.       Final cytology results are pending.      No abnormal-appearing lymph nodes were seen during endosonographic       examination in the celiac region (level 20) and in the peripancreatic       region.      There was no sign of significant endosonographic abnormality in the left       lobe of the liver. No masses were identified. Impression:        - Mucosal tear after passage of echoendoscope. This did                     not appear to be  full thickness. Clips were placed, with                     successful defect closure.                    - Gastric mucosal atrophy. Biopsied.                    - A mass was identified in the pancreatic tail. Tissue was                     obtained from this exam, and results are pending. However,                     the endosonographic appearance is highly suspicious for  adenocarcinoma. This was staged T4 N0 Mx by                     endosonographic criteria. The staging applies if                     malignancy is confirmed. Fine needle aspiration performed.                    - There was no evidence of significant pathology in the                     left lobe of the liver. Recommendation:    - Perform CT scan (computed tomography) of the abdomen                     with water soluble contrast today.                    - NPO.                    - Pending clinical course, patient should receive coverage                     for Gram Negatives (i.e. Cipro 500 mg PO) for 3 days.                    - Admit the patient to hospital ward overnight for                     observation.                    - Case was discussed with Dr. Gustavo Lah, covering for                     Gastroenterology. Procedure Code(s): --- Professional ---                    [phone], Esophagogastroduodenoscopy, flexible, transoral;                     with transendoscopic ultrasound-guided intramural or                     transmural fine needle aspiration/biopsy(s), (includes                     endoscopic ultrasound examination limited to the                     esophagus, stomach or duodenum, and adjacent structures)                    43239, 59, Esophagogastroduodenoscopy, flexible,                     transoral; with biopsy, single or multiple Diagnosis Code(s): --- Professional ---                    R93.3, Abnormal findings on diagnostic imaging of other                     parts of  digestive tract                    K31.89, Other diseases of stomach and duodenum  K86.8, Other specified diseases of pancreas CPT copyright 2014 [employer]. All rights reserved. The codes documented in this report are preliminary and upon coder review may  be revised to meet current compliance requirements. Attending Participation:      I personally performed the entire procedure without the assistance of a       fellow, resident or surgical assistant. Lindie Spruce,  05/08/2015 4:08:38 PM This report has been signed electronically. Number of Addenda: 0 Note Initiated On: 05/08/2015 2:33 PM      [facility]

## 2015-05-08 NOTE — Progress Notes (Signed)
Pt taken to CT.

## 2015-05-08 NOTE — H&P (Signed)
Primary Care Physician:  Lelon Huh, MD  Pre-Procedure History & Physical: HPI:  [name] is a 81 y.o. male is here for an Upper EUS.   Past Medical History  Diagnosis Date  . Diabetes mellitus without complication   . Hypertension   . PAD (peripheral artery disease)   . Gout   . Hypothyroidism   . History of chicken pox   . History of measles as a child   . History of mumps as a child   . SSS (sick sinus syndrome)   . Chronic kidney disease     ckd  . GERD (gastroesophageal reflux disease)   . Neuropathy   . Dysrhythmia     a fib  . Cancer     Skin cancer  . Pancreatic mass 04/2015    scheduled for biopsy on 05/08/2015    Past Surgical History  Procedure Laterality Date  . Carotid endarterectomy Right 2015    Dr. Lucky Cowboy  . Heart stents  2015    one stent per patient  . Bilateral leg stents Bilateral 2015    Bilateral iliac and right popliteal  . Skin cancer excision    . Cataract extraction Bilateral 09/2013  . Angioplasty  2009, 2010    done by Dr. Hulda Humphrey and Dr. Dian Situ  . Eye surgery    . Coronary angioplasty  2015  . Appendectomy  1962  . Pacemaker insertion N/A 04/30/2015    Procedure: INSERTION PACEMAKER;  Surgeon: Isaias Cowman, MD;  Location: [facility] ORS;  Service: Cardiovascular;  Laterality: N/A;    Prior to Admission medications   Medication Sig Start Date End Date Taking? Authorizing Provider  allopurinol (ZYLOPRIM) 100 MG tablet Take 100 mg by mouth 2 (two) times daily.   Yes Historical Provider, MD  bumetanide (BUMEX) 2 MG tablet Take 2 mg by mouth daily. 1 or 2 tablets AS NEEDED   Yes Historical Provider, MD  gabapentin (NEURONTIN) 300 MG capsule Take 3 capsules (900 mg total) by mouth at bedtime. 04/15/15  Yes Birdie Sons, MD  Garlic 1308 MG CAPS Take by mouth daily.   Yes Historical Provider, MD  glipiZIDE (GLUCOTROL) 5 MG tablet Take by mouth 2 (two) times daily before a meal.   Yes Historical Provider, MD  GLIPIZIDE XL 5 MG 24 hr tablet  TAKE 1-2 TABLETS BY MOUTH DAILY 04/18/15  Yes Birdie Sons, MD  levothyroxine (SYNTHROID, LEVOTHROID) 112 MCG tablet Take 112 mcg by mouth daily before breakfast.   Yes Historical Provider, MD  lisinopril (PRINIVIL,ZESTRIL) 10 MG tablet Take 5 mg by mouth daily.    Yes Historical Provider, MD  montelukast (SINGULAIR) 10 MG tablet Take 10 mg by mouth at bedtime.   Yes Historical Provider, MD  pravastatin (PRAVACHOL) 40 MG tablet Take 40 mg by mouth daily.   Yes Historical Provider, MD  ranitidine (ZANTAC) 150 MG tablet Take 150 mg by mouth 2 (two) times daily. AS NEEDED   Yes Historical Provider, MD  vitamin B-12 (CYANOCOBALAMIN) 1000 MCG tablet Inject 1,000 mcg into the muscle every 30 (thirty) days.   Yes Historical Provider, MD  aspirin EC 81 MG tablet Take 81 mg by mouth daily.    Historical Provider, MD  cephALEXin (KEFLEX) 250 MG capsule Take 1 capsule (250 mg total) by mouth 4 (four) times daily. 05/01/15   Isaias Cowman, MD  clopidogrel (PLAVIX) 75 MG tablet Take 1 tablet (75 mg total) by mouth daily. 04/15/15   Birdie Sons, MD  COD LIVER OIL PO  12/27/08   Historical Provider, MD  fentaNYL (DURAGESIC - DOSED MCG/HR) 12 MCG/HR Place 1 patch (12.5 mcg total) onto the skin every 3 (three) days. Patient not taking: Reported on 04/30/2015 04/14/15   Lloyd Huger, MD  glucose blood test strip 1 each daily. 08/27/14   Historical Provider, MD  Insulin Glargine (TOUJEO SOLOSTAR) 300 UNIT/ML SOPN Inject 10 Units into the skin daily. 01/21/15   Historical Provider, MD  Omega-3 Fatty Acids (FISH OIL) 1000 MG CAPS Take by mouth 2 (two) times daily.    Historical Provider, MD  oxyCODONE-acetaminophen (PERCOCET/ROXICET) 5-325 MG per tablet Take 1 tablet by mouth every 8 (eight) hours as needed for moderate pain. Patient not taking: Reported on 04/30/2015 04/04/15   Birdie Sons, MD  silver sulfADIAZINE (SILVADENE) 1 % cream Apply 1 application topically 2 (two) times daily. to affected area  01/10/15   Historical Provider, MD    Allergies as of 05/02/2015 - Review Complete 04/30/2015  Allergen Reaction Noted  . Crestor [rosuvastatin calcium] Rash 04/09/2015  . Penicillins Rash 03/14/2015    Family History  Problem Relation Age of Onset  . Diabetes Mother   . Cancer Sister     Uterine  . Arthritis Father   . Kidney disease Father     Social History   Social History  . Marital Status: Married    Spouse Name: N/A  . Number of Children: 5  . Years of Education: HS Diploma   Occupational History  . Not on file.   Social History Main Topics  . Smoking status: Never Smoker   . Smokeless tobacco: Never Used  . Alcohol Use: No  . Drug Use: No  . Sexual Activity: Not on file   Other Topics Concern  . Not on file   Social History Narrative   Lives with his wife    Review of Systems: See HPI, otherwise negative ROS  Physical Exam: BP 123/63 mmHg  Pulse 80  Temp(Src) 97.8 F (36.6 C) (Tympanic)  Resp 16  SpO2 95% General:   Alert,  pleasant and cooperative in NAD Head:  Normocephalic and atraumatic. Neck:  Supple; no masses or thyromegaly. Lungs:  Clear throughout to auscultation.    Heart:  Regular rate and rhythm. Abdomen:  Soft, nontender and nondistended. Normal bowel sounds, without guarding, and without rebound.   Neurologic:  Alert and  oriented x4;  grossly normal neurologically.  Impression/Plan: [name] is here for an Upper EUS to be performed for pancreas mass.  Risks, benefits, limitations, and alternatives regarding Upper EUS have been reviewed with the patient.  Questions have been answered.  All parties agreeable.   Cora Daniels, MD  05/08/2015, 2:30 PM

## 2015-05-08 NOTE — Transfer of Care (Signed)
Immediate Anesthesia Transfer of Care Note  Patient: [name]  Procedure(s) Performed: Procedure(s): UPPER ESOPHAGEAL ENDOSCOPIC ULTRASOUND (EUS) (N/A)  Patient Location: PACU  Anesthesia Type:General  Level of Consciousness: sedated  Airway & Oxygen Therapy: Patient Spontanous Breathing and Patient connected to nasal cannula oxygen  Post-op Assessment: Report given to RN and Post -op Vital signs reviewed and stable  Post vital signs: Reviewed and stable  Last Vitals:  Filed Vitals:   05/08/15 1311  BP: 123/63  Pulse: 80  Temp: 36.6 C  Resp: 16    Complications: No apparent anesthesia complications

## 2015-05-09 ENCOUNTER — Observation Stay: Payer: Medicare Other

## 2015-05-09 ENCOUNTER — Encounter: Payer: Self-pay | Admitting: Gastroenterology

## 2015-05-09 DIAGNOSIS — C252 Malignant neoplasm of tail of pancreas: Secondary | ICD-10-CM | POA: Diagnosis not present

## 2015-05-09 LAB — BASIC METABOLIC PANEL
Anion gap: 8 (ref 5–15)
BUN: 19 mg/dL (ref 6–20)
CHLORIDE: 102 mmol/L (ref 101–111)
CO2: 27 mmol/L (ref 22–32)
CREATININE: 1.31 mg/dL — AB (ref 0.61–1.24)
Calcium: 8.7 mg/dL — ABNORMAL LOW (ref 8.9–10.3)
GFR calc Af Amer: 54 mL/min — ABNORMAL LOW (ref >60)
GFR calc non Af Amer: 47 mL/min — ABNORMAL LOW (ref >60)
GLUCOSE: 107 mg/dL — AB (ref 65–99)
Potassium: 4.5 mmol/L (ref 3.5–5.1)
SODIUM: 137 mmol/L (ref 135–145)

## 2015-05-09 LAB — GLUCOSE, CAPILLARY
Glucose-Capillary: 88 mg/dL (ref 65–99)
Glucose-Capillary: 134 mg/dL — ABNORMAL HIGH (ref 65–99)

## 2015-05-09 LAB — CBC
HEMATOCRIT: 44.8 % (ref 40.0–52.0)
Hemoglobin: 14.3 g/dL (ref 13.0–18.0)
MCH: 30.8 pg (ref 26.0–34.0)
MCHC: 31.9 g/dL — AB (ref 32.0–36.0)
MCV: 96.6 fL (ref 80.0–100.0)
PLATELETS: 103 10*3/uL — AB (ref 150–440)
RBC: 4.64 MIL/uL (ref 4.40–5.90)
RDW: 16.2 % — AB (ref 11.5–14.5)
WBC: 6 10*3/uL (ref 3.8–10.6)

## 2015-05-09 MED ORDER — CIPROFLOXACIN HCL 500 MG PO TABS: 500.0000 mg | ORAL_TABLET | Freq: Two times a day (BID) | ORAL | Status: DC

## 2015-05-09 MED ORDER — OMEPRAZOLE 20 MG PO CPDR: 20.0000 mg | DELAYED_RELEASE_CAPSULE | Freq: Every day | ORAL | Status: DC

## 2015-05-09 NOTE — Discharge Instructions (Signed)
Liquid diet for 3-4 days the soft diet afterwards

## 2015-05-09 NOTE — Discharge Summary (Signed)
[facility] NAME: [name]    MR#:  [id number]  DATE OF BIRTH:  [birth year]  DATE OF ADMISSION:  05/08/2015 ADMITTING PHYSICIAN: Epifanio Lesches, MD  DATE OF DISCHARGE: 05/09/2015  PRIMARY CARE PHYSICIAN: Lelon Huh, MD    ADMISSION DIAGNOSIS:  PANCREATIC TAIL MASS  DISCHARGE DIAGNOSIS:  Active Problems:   Abdominal pain   SECONDARY DIAGNOSIS:   Past Medical History  Diagnosis Date  . Diabetes mellitus without complication   . Hypertension   . PAD (peripheral artery disease)   . Gout   . Hypothyroidism   . History of chicken pox   . History of measles as a child   . History of mumps as a child   . SSS (sick sinus syndrome)   . Chronic kidney disease     ckd  . GERD (gastroesophageal reflux disease)   . Neuropathy   . Dysrhythmia     a fib  . Cancer     Skin cancer  . Pancreatic mass 04/2015    scheduled for biopsy on 05/08/2015    HOSPITAL COURSE:   1.  Complication of endoscopic ultrasound biopsy with gastric perforation requiring clips. Patient currently asymptomatic. CT scan yesterday did not show any free air. Dr. Gustavo Lah gastroenterology wanted to get an x-ray this morning and if that's okay he can be discharged home on a PPI. Hold aspirin and Plavix for 1 week. Full liquid diet for 3 or 4 days then soft diet after that. Follow-up with GI one week. 2. Diabetes mellitus without complication can go back on usual medications 3. Essential hypertension-continue usual medications 4. Peripheral artery disease-aspirin and Plavix on hold 5. A pancreatic mass-follow-up with biopsy results next week. 6. 3.6 cm aortic aneurysm seen on CT scan 7. Hypothyroidism unspecified continue levothyroxine.    DISCHARGE CONDITIONS:   Satisfactory  CONSULTS OBTAINED:  Treatment Team:  Lollie Sails, MD  DRUG ALLERGIES:   Allergies  Allergen Reactions  . Adhesive [Tape] Other (See Comments)     Patient states that adhesive tape causes his skin to tear very easily and asks for paper tape ONLY  . Crestor [Rosuvastatin Calcium] Rash  . Penicillins Rash    DISCHARGE MEDICATIONS:   Current Discharge Medication List    START taking these medications   Details  omeprazole (PRILOSEC) 20 MG capsule Take 1 capsule (20 mg total) by mouth daily. Qty: 60 capsule, Refills: 0      CONTINUE these medications which have NOT CHANGED   Details  allopurinol (ZYLOPRIM) 100 MG tablet Take 100 mg by mouth 2 (two) times daily.    bumetanide (BUMEX) 2 MG tablet Take 2 mg by mouth daily. 1 or 2 tablets AS NEEDED    gabapentin (NEURONTIN) 300 MG capsule Take 3 capsules (900 mg total) by mouth at bedtime. Qty: 90 capsule, Refills: 5    Garlic 9678 MG CAPS Take by mouth daily.    glipiZIDE (GLUCOTROL) 5 MG tablet Take by mouth 2 (two) times daily before a meal.    levothyroxine (SYNTHROID, LEVOTHROID) 112 MCG tablet Take 112 mcg by mouth daily before breakfast.    lisinopril (PRINIVIL,ZESTRIL) 10 MG tablet Take 5 mg by mouth daily.     montelukast (SINGULAIR) 10 MG tablet Take 10 mg by mouth at bedtime.    pravastatin (PRAVACHOL) 40 MG tablet Take 40 mg by mouth daily.    vitamin B-12 (CYANOCOBALAMIN) 1000 MCG tablet Inject 1,000 mcg into  the muscle every 30 (thirty) days.    cephALEXin (KEFLEX) 250 MG capsule Take 1 capsule (250 mg total) by mouth 4 (four) times daily. Qty: 28 capsule, Refills: 0    fentaNYL (DURAGESIC - DOSED MCG/HR) 12 MCG/HR Place 1 patch (12.5 mcg total) onto the skin every 3 (three) days. Qty: 10 patch, Refills: 0    glucose blood test strip 1 each daily.    Insulin Glargine (TOUJEO SOLOSTAR) 300 UNIT/ML SOPN Inject 10 Units into the skin daily.    Omega-3 Fatty Acids (FISH OIL) 1000 MG CAPS Take by mouth 2 (two) times daily.    oxyCODONE-acetaminophen (PERCOCET/ROXICET) 5-325 MG per tablet Take 1 tablet by mouth every 8 (eight) hours as needed for moderate  pain. Qty: 30 tablet, Refills: 0    silver sulfADIAZINE (SILVADENE) 1 % cream Apply 1 application topically 2 (two) times daily. to affected area      STOP taking these medications     GLIPIZIDE XL 5 MG 24 hr tablet      ranitidine (ZANTAC) 150 MG tablet      aspirin EC 81 MG tablet      clopidogrel (PLAVIX) 75 MG tablet      COD LIVER OIL PO          DISCHARGE INSTRUCTIONS:    Follow-up one week Dr. Gustavo Lah  If you experience worsening of your admission symptoms, develop shortness of breath, life threatening emergency, suicidal or homicidal thoughts you must seek medical attention immediately by calling 911 or calling your MD immediately  if symptoms less severe.  You Must read complete instructions/literature along with all the possible adverse reactions/side effects for all the Medicines you take and that have been prescribed to you. Take any new Medicines after you have completely understood and accept all the possible adverse reactions/side effects.   Please note  You were cared for by a hospitalist during your hospital stay. If you have any questions about your discharge medications or the care you received while you were in the hospital after you are discharged, you can call the unit and asked to speak with the hospitalist on call if the hospitalist that took care of you is not available. Once you are discharged, your primary care physician will handle any further medical issues. Please note that NO REFILLS for any discharge medications will be authorized once you are discharged, as it is imperative that you return to your primary care physician (or establish a relationship with a primary care physician if you do not have one) for your aftercare needs so that they can reassess your need for medications and monitor your lab values.    Today   CHIEF COMPLAINT:  No chief complaint on file.   HISTORY OF PRESENT ILLNESS:  [name]  is a 81 y.o. male with a known  history of pancreatic mass. Had a endoscopic ultrasound and biopsy yesterday with gastric perforation requiring clips.   VITAL SIGNS:  Blood pressure 146/52, pulse 80, temperature 97.6 F (36.4 C), temperature source Oral, resp. rate 16, height 6' (1.829 m), weight 87.9 kg (193 lb 12.6 oz), SpO2 94 %.    PHYSICAL EXAMINATION:  GENERAL:  81 y.o.-year-old patient lying in the bed with no acute distress.  EYES: Pupils equal, round, reactive to light and accommodation. No scleral icterus. Extraocular muscles intact.  HEENT: Head atraumatic, normocephalic. Oropharynx and nasopharynx clear.  NECK:  Supple, no jugular venous distention. No thyroid enlargement, no tenderness.  LUNGS: Normal breath sounds  bilaterally, no wheezing, rales,rhonchi or crepitation. No use of accessory muscles of respiration.  CARDIOVASCULAR: S1, S2 normal. No murmurs, rubs, or gallops.  ABDOMEN: Soft, non-tender, non-distended. Bowel sounds present. No organomegaly or mass.  EXTREMITIES: No pedal edema, cyanosis, or clubbing.  NEUROLOGIC: Cranial nerves II through XII are intact. Muscle strength 5/5 in all extremities. Sensation intact. Gait not checked.  PSYCHIATRIC: The patient is alert and oriented x 3.  SKIN: No obvious rash, lesion, or ulcer.   DATA REVIEW:   CBC  Recent Labs Lab 05/09/15 0430  WBC 6.0  HGB 14.3  HCT 44.8  PLT 103*    Chemistries   Recent Labs Lab 05/09/15 0430  NA 137  K 4.5  CL 102  CO2 27  GLUCOSE 107*  BUN 19  CREATININE 1.31*  CALCIUM 8.7*    Cardiac Enzymes No results for input(s): TROPONINI in the last 168 hours.  Microbiology Results  Results for orders placed or performed during the hospital encounter of 01/29/15  Wound culture     Status: None   Collection Time: 01/29/15  3:33 PM  Result Value Ref Range Status   Specimen Description WOUND  Final   Special Requests NONE  Final   Gram Stain RARE WBC SEEN NO ORGANISMS SEEN   Final   Culture RARE CANDIDA  PARAPSILOSIS  Final   Report Status 02/04/2015 FINAL  Final    RADIOLOGY:  Ct Abdomen Pelvis Wo Contrast  05/08/2015   CLINICAL DATA:  Status post EUS, abdominal pain, evaluate for perforation at cardia of stomach  EXAM: CT ABDOMEN AND PELVIS WITHOUT CONTRAST  TECHNIQUE: Multidetector CT imaging of the abdomen and pelvis was performed following the standard protocol without IV contrast.  COMPARISON:  PET-CT dated 03/31/2015. CT abdomen pelvis dated 03/14/2015.  FINDINGS: Lower chest:  Mild dependent atelectasis the lung bases.  Cardiomegaly.  Pacemaker leads, incompletely visualized.  Hepatobiliary: Unenhanced liver is grossly unremarkable.  Gallbladder is unremarkable. No intrahepatic or extrahepatic ductal dilatation.  Pancreas: 6.0 x 5.9 cm mass in the distal pancreatic body/ tail (series 2/ image 34), poorly evaluated on unenhanced CT, corresponding to suspected primary pancreatic neoplasm.  Mass tethers/involves the posterior wall of the stomach (series 2/image 30) and also tethers/involves the descending colon (series 2/image 31).  Mass abuts the celiac axis (series 2/ image 34). Splenic vein is occluded with collateralization. Mass abuts the SMV (series 2/image 37).  Spleen: Within normal limits.  Adrenals/Urinary Tract: Adrenal glands are unremarkable.  Left renal atrophy.  Right kidney is unremarkable.  No renal, ureteral, or bladder calculi.  Bladder is within normal limits.  Stomach/Bowel: Contrast within the stomach. Surgical clips in the region of the gastric cardia (series 2/ image 24). No extraluminal contrast or free air to suggest gastric perforation.  No evidence of bowel obstruction.  Colonic diverticulosis, without evidence of diverticulitis.  Vascular/Lymphatic: Atherosclerotic calcifications of the abdominal aorta and branch vessels.  3.6 x 3.5 cm infrarenal abdominal aortic aneurysm (series 2/ image 57).  No suspicious abdominopelvic lymphadenopathy.  Reproductive: Prostate is notable  for dystrophic calcifications.  Other: No abdominopelvic ascites.  Small fat containing right inguinal hernia.  Musculoskeletal: Degenerative changes of the visualized thoracolumbar spine.  IMPRESSION: Surgical clips in the region of the gastric cardia. No extraluminal contrast or free air to suggest gastric perforation.  Stable distal pancreatic mass, corresponding to suspected primary pancreatic neoplasm. Secondary involvement of the posterior stomach and adjacent descending colon. Suspected vascular involvement, as above.  Stable 3.6 cm infrarenal  abdominal aortic aneurysm.  Additional ancillary findings as above.   Electronically Signed   By: Julian Hy M.D.   On: 05/08/2015 17:13   Management plans discussed with the patient, family and they are in agreement.  CODE STATUS:     Code Status Orders        Start     Ordered   05/08/15 1750  Full code   Continuous     05/08/15 1751    Advance Directive Documentation        Most Recent Value   Type of Advance Directive  Living will   Pre-existing out of facility DNR order (yellow form or pink MOST form)     "MOST" Form in Place?        TOTAL TIME TAKING CARE OF THIS PATIENT: 38 minutes.    Loletha Grayer M.D on 05/09/2015 at 10:03 AM  Between 7am to 6pm - Pager - [phone]  After 6pm go to [email or web address] - password [email or web address]  Office  [phone]  CC: Primary care physician; Lelon Huh, MD

## 2015-05-12 LAB — CYTOLOGY - NON PAP

## 2015-05-12 NOTE — Anesthesia Postprocedure Evaluation (Signed)
  Anesthesia Post-op Note  Patient: [name]  Procedure(s) Performed: Procedure(s): UPPER ESOPHAGEAL ENDOSCOPIC ULTRASOUND (EUS) (N/A)  Anesthesia type:General  Patient location: PACU  Post pain: Pain level controlled  Post assessment: Post-op Vital signs reviewed, Patient's Cardiovascular Status Stable, Respiratory Function Stable, Patent Airway and No signs of Nausea or vomiting  Post vital signs: Reviewed and stable  Last Vitals:  Filed Vitals:   05/09/15 0747  BP: 146/52  Pulse: 80  Temp: 36.4 C  Resp: 16    Level of consciousness: awake, alert  and patient cooperative  Complications: No apparent anesthesia complications

## 2015-05-14 ENCOUNTER — Telehealth: Payer: Self-pay

## 2015-05-14 LAB — SURGICAL PATHOLOGY

## 2015-05-14 NOTE — Telephone Encounter (Signed)
  Oncology Nurse Navigator Documentation    Navigator Encounter Type: Telephone (05/14/15 1600)                      Time Spent with Patient: 15 (05/14/15 1600)   Pathology results are back and pt would like appt to come in to get results sooner than 9/6. Dr Loura Back can see pt 9/1 at 1430. He was made aware

## 2015-05-15 ENCOUNTER — Inpatient Hospital Stay: Payer: Medicare Other | Attending: Oncology | Admitting: Oncology

## 2015-05-15 VITALS — BP 89/58 | HR 88 | Temp 96.1°F | Resp 16

## 2015-05-15 DIAGNOSIS — C250 Malignant neoplasm of head of pancreas: Secondary | ICD-10-CM

## 2015-05-15 DIAGNOSIS — K4090 Unilateral inguinal hernia, without obstruction or gangrene, not specified as recurrent: Secondary | ICD-10-CM | POA: Diagnosis not present

## 2015-05-15 DIAGNOSIS — I495 Sick sinus syndrome: Secondary | ICD-10-CM | POA: Diagnosis not present

## 2015-05-15 DIAGNOSIS — Z5111 Encounter for antineoplastic chemotherapy: Secondary | ICD-10-CM | POA: Diagnosis not present

## 2015-05-15 DIAGNOSIS — Z85828 Personal history of other malignant neoplasm of skin: Secondary | ICD-10-CM

## 2015-05-15 DIAGNOSIS — I129 Hypertensive chronic kidney disease with stage 1 through stage 4 chronic kidney disease, or unspecified chronic kidney disease: Secondary | ICD-10-CM | POA: Insufficient documentation

## 2015-05-15 DIAGNOSIS — Z8639 Personal history of other endocrine, nutritional and metabolic disease: Secondary | ICD-10-CM | POA: Diagnosis not present

## 2015-05-15 DIAGNOSIS — C259 Malignant neoplasm of pancreas, unspecified: Secondary | ICD-10-CM | POA: Insufficient documentation

## 2015-05-15 DIAGNOSIS — N189 Chronic kidney disease, unspecified: Secondary | ICD-10-CM | POA: Diagnosis not present

## 2015-05-15 DIAGNOSIS — R978 Other abnormal tumor markers: Secondary | ICD-10-CM | POA: Insufficient documentation

## 2015-05-15 DIAGNOSIS — D696 Thrombocytopenia, unspecified: Secondary | ICD-10-CM | POA: Diagnosis not present

## 2015-05-15 DIAGNOSIS — Z950 Presence of cardiac pacemaker: Secondary | ICD-10-CM | POA: Diagnosis not present

## 2015-05-15 DIAGNOSIS — Z79899 Other long term (current) drug therapy: Secondary | ICD-10-CM | POA: Diagnosis not present

## 2015-05-15 DIAGNOSIS — K219 Gastro-esophageal reflux disease without esophagitis: Secondary | ICD-10-CM | POA: Diagnosis not present

## 2015-05-15 DIAGNOSIS — I4891 Unspecified atrial fibrillation: Secondary | ICD-10-CM | POA: Diagnosis not present

## 2015-05-15 DIAGNOSIS — E039 Hypothyroidism, unspecified: Secondary | ICD-10-CM | POA: Diagnosis not present

## 2015-05-15 DIAGNOSIS — I739 Peripheral vascular disease, unspecified: Secondary | ICD-10-CM | POA: Insufficient documentation

## 2015-05-15 DIAGNOSIS — R944 Abnormal results of kidney function studies: Secondary | ICD-10-CM | POA: Diagnosis not present

## 2015-05-15 DIAGNOSIS — Z794 Long term (current) use of insulin: Secondary | ICD-10-CM | POA: Diagnosis not present

## 2015-05-15 DIAGNOSIS — E119 Type 2 diabetes mellitus without complications: Secondary | ICD-10-CM | POA: Insufficient documentation

## 2015-05-15 DIAGNOSIS — G629 Polyneuropathy, unspecified: Secondary | ICD-10-CM | POA: Diagnosis not present

## 2015-05-18 ENCOUNTER — Other Ambulatory Visit: Payer: Self-pay | Admitting: Family Medicine

## 2015-05-19 DIAGNOSIS — C259 Malignant neoplasm of pancreas, unspecified: Secondary | ICD-10-CM | POA: Insufficient documentation

## 2015-05-19 NOTE — Progress Notes (Signed)
[place]  Telephone:([phone]) [phone] Fax:(336) [phone]  ID: [name] OB: 1925-03-13  MR#: [id number]  PQA#:[id number]  [name], MD as PCP - General (Family Medicine) [name], MD as Referring Physician (Vascular Surgery) [name], MD as Consulting Physician (Cardiology) [name], MD as Consulting Physician (Hematology and Oncology) Clent Jacks, RN as Registered Nurse  CHIEF COMPLAINT:  Chief Complaint  Patient presents with  . Follow-up    discuss test results    INTERVAL HISTORY: Patient returns to clinic today to discuss his pathology results and treatment planning. He has now completed his cardiac workup and pacemaker has been placed. He currently feels well.  He has no neurologic complaints. He denies any weight loss. He denies any recent fevers. He has a good appetite and denies any nausea, vomiting, constipation, or diarrhea. He has no changes in his bowel movements and denies any melanotic or hematochezia. He has no urinary complaints. Patient offers no specific complaints today.  REVIEW OF SYSTEMS:   Review of Systems  Constitutional: Negative.   Respiratory: Negative.   Cardiovascular: Negative.   Gastrointestinal: Negative for abdominal pain.    As per HPI. Otherwise, a complete review of systems is negatve.  PAST MEDICAL HISTORY: Past Medical History  Diagnosis Date  . Diabetes mellitus without complication   . Hypertension   . PAD (peripheral artery disease)   . Gout   . Hypothyroidism   . History of chicken pox   . History of measles as a child   . History of mumps as a child   . SSS (sick sinus syndrome)   . Chronic kidney disease     ckd  . GERD (gastroesophageal reflux disease)   . Neuropathy   . Dysrhythmia     a fib  . Cancer     Skin cancer  . Pancreatic mass 04/2015    scheduled for biopsy on 05/08/2015    PAST SURGICAL HISTORY: Past Surgical History  Procedure  Laterality Date  . Carotid endarterectomy Right 2015    Dr. Lucky Cowboy  . Heart stents  2015    one stent per patient  . Bilateral leg stents Bilateral 2015    Bilateral iliac and right popliteal  . Skin cancer excision    . Cataract extraction Bilateral 09/2013  . Angioplasty  2009, 2010    done by Dr. Hulda Humphrey and Dr. Dian Situ  . Eye surgery    . Coronary angioplasty  2015  . Appendectomy  1962  . Pacemaker insertion N/A 04/30/2015    Procedure: INSERTION PACEMAKER;  Surgeon: Isaias Cowman, MD;  Location: [facility] ORS;  Service: Cardiovascular;  Laterality: N/A;  . Upper esophageal endoscopic ultrasound (eus) N/A 05/08/2015    Procedure: UPPER ESOPHAGEAL ENDOSCOPIC ULTRASOUND (EUS);  Surgeon: Cora Daniels, MD;  Location: [facility];  Service: Endoscopy;  Laterality: N/A;    FAMILY HISTORY Family History  Problem Relation Age of Onset  . Diabetes Mother   . Cancer Sister     Uterine  . Arthritis Father   . Kidney disease Father        ADVANCED DIRECTIVES:    HEALTH MAINTENANCE: Social History  Substance Use Topics  . Smoking status: Never Smoker   . Smokeless tobacco: Never Used  . Alcohol Use: No     Colonoscopy:  PAP:  Bone density:  Lipid panel:  Allergies  Allergen Reactions  . Adhesive [Tape] Other (See Comments)  Patient states that adhesive tape causes his skin to tear very easily and asks for paper tape ONLY  . Crestor [Rosuvastatin Calcium] Rash  . Penicillins Rash    Current Outpatient Prescriptions  Medication Sig Dispense Refill  . allopurinol (ZYLOPRIM) 100 MG tablet Take 100 mg by mouth 2 (two) times daily.    . bumetanide (BUMEX) 2 MG tablet Take 2 mg by mouth daily. 1 or 2 tablets AS NEEDED    . ciprofloxacin (CIPRO) 500 MG tablet Take 1 tablet (500 mg total) by mouth 2 (two) times daily. 14 tablet 0  . gabapentin (NEURONTIN) 300 MG capsule Take 3 capsules (900 mg total) by mouth at bedtime. 90 capsule 5  . Garlic 8563 MG CAPS Take by  mouth daily.    Marland Kitchen glipiZIDE (GLUCOTROL) 5 MG tablet Take by mouth 2 (two) times daily before a meal.    . Insulin Glargine (TOUJEO SOLOSTAR) 300 UNIT/ML SOPN Inject 10 Units into the skin daily.    Marland Kitchen levothyroxine (SYNTHROID, LEVOTHROID) 112 MCG tablet Take 112 mcg by mouth daily before breakfast.    . lisinopril (PRINIVIL,ZESTRIL) 10 MG tablet Take 5 mg by mouth daily.     . montelukast (SINGULAIR) 10 MG tablet Take 10 mg by mouth at bedtime.    . Omega-3 Fatty Acids (FISH OIL) 1000 MG CAPS Take by mouth 2 (two) times daily.    Marland Kitchen oxyCODONE-acetaminophen (PERCOCET/ROXICET) 5-325 MG per tablet Take 1 tablet by mouth every 8 (eight) hours as needed for moderate pain. 30 tablet 0  . pravastatin (PRAVACHOL) 40 MG tablet Take 40 mg by mouth daily.    . ranitidine (ZANTAC) 150 MG tablet Take 150 mg by mouth daily. 1-2 tabs daily    . silver sulfADIAZINE (SILVADENE) 1 % cream Apply 1 application topically 2 (two) times daily. to affected area    . vitamin B-12 (CYANOCOBALAMIN) 1000 MCG tablet Inject 1,000 mcg into the muscle every 30 (thirty) days.     No current facility-administered medications for this visit.    OBJECTIVE: Filed Vitals:   05/15/15 1443  BP: 89/58  Pulse: 88  Temp: 96.1 F (35.6 C)  Resp: 16     There is no weight on file to calculate BMI.    ECOG FS:1 - Symptomatic but completely ambulatory  General: Well-developed, well-nourished, no acute distress. Eyes: Pink conjunctiva, anicteric sclera. Lungs: Clear to auscultation bilaterally. Heart: Regular rate and rhythm. No rubs, murmurs, or gallops. Abdomen: Soft, nontender, nondistended. No organomegaly noted, normoactive bowel sounds. Musculoskeletal: No edema, cyanosis, or clubbing. Neuro: Alert, answering all questions appropriately. Cranial nerves grossly intact. Skin: No rashes or petechiae noted. Psych: Normal affect.  LAB RESULTS:  Lab Results  Component Value Date   NA 137 05/09/2015   K 4.5 05/09/2015   CL  102 05/09/2015   CO2 27 05/09/2015   GLUCOSE 107* 05/09/2015   BUN 19 05/09/2015   CREATININE 1.31* 05/09/2015   CALCIUM 8.7* 05/09/2015   PROT 7.2 03/14/2015   ALBUMIN 4.2 03/14/2015   AST 25 03/14/2015   ALT 15* 03/14/2015   ALKPHOS 91 03/14/2015   BILITOT 0.5 03/14/2015   GFRNONAA 47* 05/09/2015   GFRAA 54* 05/09/2015    Lab Results  Component Value Date   WBC 6.0 05/09/2015   NEUTROABS 6.0 04/24/2015   HGB 14.3 05/09/2015   HCT 44.8 05/09/2015   MCV 96.6 05/09/2015   PLT 103* 05/09/2015     STUDIES: Ct Abdomen Pelvis Wo Contrast  05/08/2015  CLINICAL DATA:  Status post EUS, abdominal pain, evaluate for perforation at cardia of stomach  EXAM: CT ABDOMEN AND PELVIS WITHOUT CONTRAST  TECHNIQUE: Multidetector CT imaging of the abdomen and pelvis was performed following the standard protocol without IV contrast.  COMPARISON:  PET-CT dated 03/31/2015. CT abdomen pelvis dated 03/14/2015.  FINDINGS: Lower chest:  Mild dependent atelectasis the lung bases.  Cardiomegaly.  Pacemaker leads, incompletely visualized.  Hepatobiliary: Unenhanced liver is grossly unremarkable.  Gallbladder is unremarkable. No intrahepatic or extrahepatic ductal dilatation.  Pancreas: 6.0 x 5.9 cm mass in the distal pancreatic body/ tail (series 2/ image 34), poorly evaluated on unenhanced CT, corresponding to suspected primary pancreatic neoplasm.  Mass tethers/involves the posterior wall of the stomach (series 2/image 30) and also tethers/involves the descending colon (series 2/image 31).  Mass abuts the celiac axis (series 2/ image 34). Splenic vein is occluded with collateralization. Mass abuts the SMV (series 2/image 37).  Spleen: Within normal limits.  Adrenals/Urinary Tract: Adrenal glands are unremarkable.  Left renal atrophy.  Right kidney is unremarkable.  No renal, ureteral, or bladder calculi.  Bladder is within normal limits.  Stomach/Bowel: Contrast within the stomach. Surgical clips in the region of  the gastric cardia (series 2/ image 24). No extraluminal contrast or free air to suggest gastric perforation.  No evidence of bowel obstruction.  Colonic diverticulosis, without evidence of diverticulitis.  Vascular/Lymphatic: Atherosclerotic calcifications of the abdominal aorta and branch vessels.  3.6 x 3.5 cm infrarenal abdominal aortic aneurysm (series 2/ image 57).  No suspicious abdominopelvic lymphadenopathy.  Reproductive: Prostate is notable for dystrophic calcifications.  Other: No abdominopelvic ascites.  Small fat containing right inguinal hernia.  Musculoskeletal: Degenerative changes of the visualized thoracolumbar spine.  IMPRESSION: Surgical clips in the region of the gastric cardia. No extraluminal contrast or free air to suggest gastric perforation.  Stable distal pancreatic mass, corresponding to suspected primary pancreatic neoplasm. Secondary involvement of the posterior stomach and adjacent descending colon. Suspected vascular involvement, as above.  Stable 3.6 cm infrarenal abdominal aortic aneurysm.  Additional ancillary findings as above.   Electronically Signed   By: Julian Hy M.D.   On: 05/08/2015 17:13   Dg Chest 1 View  04/30/2015   CLINICAL DATA:  Post pacemaker insertion, history of hypertension  EXAM: CHEST  1 VIEW  COMPARISON:  Chest x-ray of 04/24/2015  FINDINGS: No active infiltrate or effusion is seen. Minimally prominent markings remain at the lung bases consistent with linear atelectasis or scarring. A single lead permanent pacemaker is now noted with the tip extending toward the apex of the right ventricle. No pneumothorax is seen. Mild cardiomegaly is stable.  IMPRESSION: Single lead permanent pacemaker tip extends toward the apex of the right ventricle. No pneumothorax.   Electronically Signed   By: Ivar Drape M.D.   On: 04/30/2015 15:29   Dg Chest 2 View  04/24/2015   CLINICAL DATA:  81 year old male with history of hypertension. Preoperative evaluation prior  to pacemaker placement.  EXAM: CHEST  2 VIEW  COMPARISON:  Chest x-ray 03/29/2008.  FINDINGS: Low lung volumes. Elevation of the left hemidiaphragm, similar to prior CT scan. No consolidative airspace disease. No pleural effusions. Linear opacities in lung bases bilaterally may reflect areas of mild subsegmental atelectasis or scarring. No evidence of pulmonary edema. Heart size is normal. Upper mediastinal contours are within normal limits. Atherosclerosis in the thoracic aorta.  IMPRESSION: 1. Low lung volumes without radiographic evidence of acute cardiopulmonary disease. 2. Atherosclerosis.  Electronically Signed   By: Vinnie Langton M.D.   On: 04/24/2015 15:57   Dg Abd 2 Views  05/09/2015   CLINICAL DATA:  Gastric perforation.  EXAM: ABDOMEN - 2 VIEW  COMPARISON:  None.  FINDINGS: Cardiac pacer. Surgical clips upper abdomen. No bowel distention. No free air. Nondilated loops of bowel are noted. Vascular calcification with right femoral vascular stents noted.  IMPRESSION: No evidence of significant bowel obstruction or free air. Oral contrast is noted throughout the colon. The stomach is nondistended. No abnormal perigastric free air or contrast collections are noted.   Electronically Signed   By: Marcello Moores  Register   On: 05/09/2015 11:47   Dg C-arm 61-120 Min-no Report  04/30/2015   CLINICAL DATA: surgery   C-ARM 61-120 MINUTES  Fluoroscopy was utilized by the requesting physician.  No radiographic  interpretation.     ASSESSMENT: Stage III adenocarcinoma of the pancreas.  PLAN:    1. Pancreatic mass: PET scan results as above reviewed independently. CA-19-9 is elevated at 112.  EUS confirmed pancreatic adenocarcinoma. Patient is not a surgical candidate.  After lengthy discussion with patient and his family, he wishes to proceed with palliative chemotherapy. He will return to clinic in 2 weeks to initiate cycle 1, day 1 of gemcitabine and Abraxane. Patient wishes to forego port at this time, but  will consider one in the future if he continues with treatment.  2. Pain: Continue fentanyl patch and oxycodone as needed.   Approximately 30 minutes was spent in discussion and consultation.  Patient expressed understanding and was in agreement with this plan. He also understands that He can call clinic at any time with any questions, concerns, or complaints.   Lloyd Huger, MD   05/19/2015 11:56 AM

## 2015-05-20 ENCOUNTER — Inpatient Hospital Stay: Payer: Medicare Other

## 2015-05-20 ENCOUNTER — Ambulatory Visit: Payer: Self-pay | Admitting: Oncology

## 2015-05-20 NOTE — Patient Instructions (Signed)
Gemcitabine injection What is this medicine? GEMCITABINE (jem SIT a been) is a chemotherapy drug. This medicine is used to treat many types of cancer like breast cancer, lung cancer, pancreatic cancer, and ovarian cancer. This medicine may be used for other purposes; ask your health care provider or pharmacist if you have questions. COMMON BRAND NAME(S): Gemzar What should I tell my health care provider before I take this medicine? They need to know if you have any of these conditions: -blood disorders -infection -kidney disease -liver disease -recent or ongoing radiation therapy -an unusual or allergic reaction to gemcitabine, other chemotherapy, other medicines, foods, dyes, or preservatives -pregnant or trying to get pregnant -breast-feeding How should I use this medicine? This drug is given as an infusion into a vein. It is administered in a hospital or clinic by a specially trained health care professional. Talk to your pediatrician regarding the use of this medicine in children. Special care may be needed. Overdosage: If you think you have taken too much of this medicine contact a poison control center or emergency room at once. NOTE: This medicine is only for you. Do not share this medicine with others. What if I miss a dose? It is important not to miss your dose. Call your doctor or health care professional if you are unable to keep an appointment. What may interact with this medicine? -medicines to increase blood counts like filgrastim, pegfilgrastim, sargramostim -some other chemotherapy drugs like cisplatin -vaccines Talk to your doctor or health care professional before taking any of these medicines: -acetaminophen -aspirin -ibuprofen -ketoprofen -naproxen This list may not describe all possible interactions. Give your health care provider a list of all the medicines, herbs, non-prescription drugs, or dietary supplements you use. Also tell them if you smoke, drink alcohol,  or use illegal drugs. Some items may interact with your medicine. What should I watch for while using this medicine? Visit your doctor for checks on your progress. This drug may make you feel generally unwell. This is not uncommon, as chemotherapy can affect healthy cells as well as cancer cells. Report any side effects. Continue your course of treatment even though you feel ill unless your doctor tells you to stop. In some cases, you may be given additional medicines to help with side effects. Follow all directions for their use. Call your doctor or health care professional for advice if you get a fever, chills or sore throat, or other symptoms of a cold or flu. Do not treat yourself. This drug decreases your body's ability to fight infections. Try to avoid being around people who are sick. This medicine may increase your risk to bruise or bleed. Call your doctor or health care professional if you notice any unusual bleeding. Be careful brushing and flossing your teeth or using a toothpick because you may get an infection or bleed more easily. If you have any dental work done, tell your dentist you are receiving this medicine. Avoid taking products that contain aspirin, acetaminophen, ibuprofen, naproxen, or ketoprofen unless instructed by your doctor. These medicines may hide a fever. Women should inform their doctor if they wish to become pregnant or think they might be pregnant. There is a potential for serious side effects to an unborn child. Talk to your health care professional or pharmacist for more information. Do not breast-feed an infant while taking this medicine. What side effects may I notice from receiving this medicine? Side effects that you should report to your doctor or health care professional as   soon as possible: -allergic reactions like skin rash, itching or hives, swelling of the face, lips, or tongue -low blood counts - this medicine may decrease the number of white blood cells,  red blood cells and platelets. You may be at increased risk for infections and bleeding. -signs of infection - fever or chills, cough, sore throat, pain or difficulty passing urine -signs of decreased platelets or bleeding - bruising, pinpoint red spots on the skin, black, tarry stools, blood in the urine -signs of decreased red blood cells - unusually weak or tired, fainting spells, lightheadedness -breathing problems -chest pain -mouth sores -nausea and vomiting -pain, swelling, redness at site where injected -pain, tingling, numbness in the hands or feet -stomach pain -swelling of ankles, feet, hands -unusual bleeding Side effects that usually do not require medical attention (report to your doctor or health care professional if they continue or are bothersome): -constipation -diarrhea -hair loss -loss of appetite -stomach upset This list may not describe all possible side effects. Call your doctor for medical advice about side effects. You may report side effects to FDA at 1-800-FDA-1088. Where should I keep my medicine? This drug is given in a hospital or clinic and will not be stored at home. NOTE: This sheet is a summary. It may not cover all possible information. If you have questions about this medicine, talk to your doctor, pharmacist, or health care provider.  2015, Elsevier/Gold Standard. (2008-01-09 18:45:54) Nanoparticle Albumin-Bound Paclitaxel injection What is this medicine? NANOPARTICLE ALBUMIN-BOUND PACLITAXEL (Na no PAHR ti kuhl al BYOO muhn-bound PAK li TAX el) is a chemotherapy drug. It targets fast dividing cells, like cancer cells, and causes these cells to die. This medicine is used to treat advanced breast cancer and advanced lung cancer. This medicine may be used for other purposes; ask your health care provider or pharmacist if you have questions. COMMON BRAND NAME(S): Abraxane What should I tell my health care provider before I take this medicine? They need  to know if you have any of these conditions: -kidney disease -liver disease -low blood counts, like low platelets, red blood cells, or white blood cells -recent or ongoing radiation therapy -an unusual or allergic reaction to paclitaxel, albumin, other chemotherapy, other medicines, foods, dyes, or preservatives -pregnant or trying to get pregnant -breast-feeding How should I use this medicine? This drug is given as an infusion into a vein. It is administered in a hospital or clinic by a specially trained health care professional. Talk to your pediatrician regarding the use of this medicine in children. Special care may be needed. Overdosage: If you think you have taken too much of this medicine contact a poison control center or emergency room at once. NOTE: This medicine is only for you. Do not share this medicine with others. What if I miss a dose? It is important not to miss your dose. Call your doctor or health care professional if you are unable to keep an appointment. What may interact with this medicine? -cyclosporine -diazepam -ketoconazole -medicines to increase blood counts like filgrastim, pegfilgrastim, sargramostim -other chemotherapy drugs like cisplatin, doxorubicin, epirubicin, etoposide, teniposide, vincristine -quinidine -testosterone -vaccines -verapamil Talk to your doctor or health care professional before taking any of these medicines: -acetaminophen -aspirin -ibuprofen -ketoprofen -naproxen This list may not describe all possible interactions. Give your health care provider a list of all the medicines, herbs, non-prescription drugs, or dietary supplements you use. Also tell them if you smoke, drink alcohol, or use illegal drugs. Some items may   interact with your medicine. What should I watch for while using this medicine? Your condition will be monitored carefully while you are receiving this medicine. You will need important blood work done while you are  taking this medicine. This drug may make you feel generally unwell. This is not uncommon, as chemotherapy can affect healthy cells as well as cancer cells. Report any side effects. Continue your course of treatment even though you feel ill unless your doctor tells you to stop. In some cases, you may be given additional medicines to help with side effects. Follow all directions for their use. Call your doctor or health care professional for advice if you get a fever, chills or sore throat, or other symptoms of a cold or flu. Do not treat yourself. This drug decreases your body's ability to fight infections. Try to avoid being around people who are sick. This medicine may increase your risk to bruise or bleed. Call your doctor or health care professional if you notice any unusual bleeding. Be careful brushing and flossing your teeth or using a toothpick because you may get an infection or bleed more easily. If you have any dental work done, tell your dentist you are receiving this medicine. Avoid taking products that contain aspirin, acetaminophen, ibuprofen, naproxen, or ketoprofen unless instructed by your doctor. These medicines may hide a fever. Do not become pregnant while taking this medicine. Women should inform their doctor if they wish to become pregnant or think they might be pregnant. There is a potential for serious side effects to an unborn child. Talk to your health care professional or pharmacist for more information. Do not breast-feed an infant while taking this medicine. Men are advised not to father a child while receiving this medicine. What side effects may I notice from receiving this medicine? Side effects that you should report to your doctor or health care professional as soon as possible: -allergic reactions like skin rash, itching or hives, swelling of the face, lips, or tongue -low blood counts - This drug may decrease the number of white blood cells, red blood cells and  platelets. You may be at increased risk for infections and bleeding. -signs of infection - fever or chills, cough, sore throat, pain or difficulty passing urine -signs of decreased platelets or bleeding - bruising, pinpoint red spots on the skin, black, tarry stools, nosebleeds -signs of decreased red blood cells - unusually weak or tired, fainting spells, lightheadedness -breathing problems -changes in vision -chest pain -high or low blood pressure -mouth sores -nausea and vomiting -pain, swelling, redness or irritation at the injection site -pain, tingling, numbness in the hands or feet -slow or irregular heartbeat -swelling of the ankle, feet, hands Side effects that usually do not require medical attention (report to your doctor or health care professional if they continue or are bothersome): -aches, pains -changes in the color of fingernails -diarrhea -hair loss -loss of appetite This list may not describe all possible side effects. Call your doctor for medical advice about side effects. You may report side effects to FDA at 1-800-FDA-1088. Where should I keep my medicine? This drug is given in a hospital or clinic and will not be stored at home. NOTE: This sheet is a summary. It may not cover all possible information. If you have questions about this medicine, talk to your doctor, pharmacist, or health care provider.  2015, Elsevier/Gold Standard. (2012-10-23 16:48:50)  

## 2015-05-29 ENCOUNTER — Inpatient Hospital Stay (HOSPITAL_BASED_OUTPATIENT_CLINIC_OR_DEPARTMENT_OTHER): Payer: Medicare Other | Admitting: Oncology

## 2015-05-29 ENCOUNTER — Inpatient Hospital Stay: Payer: Medicare Other

## 2015-05-29 VITALS — BP 109/71 | HR 81 | Temp 96.4°F | Resp 16 | Wt 191.6 lb

## 2015-05-29 DIAGNOSIS — C259 Malignant neoplasm of pancreas, unspecified: Secondary | ICD-10-CM

## 2015-05-29 DIAGNOSIS — I495 Sick sinus syndrome: Secondary | ICD-10-CM

## 2015-05-29 DIAGNOSIS — N189 Chronic kidney disease, unspecified: Secondary | ICD-10-CM

## 2015-05-29 DIAGNOSIS — I739 Peripheral vascular disease, unspecified: Secondary | ICD-10-CM

## 2015-05-29 DIAGNOSIS — C252 Malignant neoplasm of tail of pancreas: Secondary | ICD-10-CM

## 2015-05-29 DIAGNOSIS — K4090 Unilateral inguinal hernia, without obstruction or gangrene, not specified as recurrent: Secondary | ICD-10-CM

## 2015-05-29 DIAGNOSIS — I129 Hypertensive chronic kidney disease with stage 1 through stage 4 chronic kidney disease, or unspecified chronic kidney disease: Secondary | ICD-10-CM

## 2015-05-29 DIAGNOSIS — Z950 Presence of cardiac pacemaker: Secondary | ICD-10-CM

## 2015-05-29 DIAGNOSIS — G629 Polyneuropathy, unspecified: Secondary | ICD-10-CM

## 2015-05-29 DIAGNOSIS — Z794 Long term (current) use of insulin: Secondary | ICD-10-CM | POA: Diagnosis not present

## 2015-05-29 DIAGNOSIS — E119 Type 2 diabetes mellitus without complications: Secondary | ICD-10-CM

## 2015-05-29 DIAGNOSIS — Z79899 Other long term (current) drug therapy: Secondary | ICD-10-CM

## 2015-05-29 DIAGNOSIS — Z85828 Personal history of other malignant neoplasm of skin: Secondary | ICD-10-CM

## 2015-05-29 DIAGNOSIS — I4891 Unspecified atrial fibrillation: Secondary | ICD-10-CM

## 2015-05-29 DIAGNOSIS — K219 Gastro-esophageal reflux disease without esophagitis: Secondary | ICD-10-CM

## 2015-05-29 DIAGNOSIS — E039 Hypothyroidism, unspecified: Secondary | ICD-10-CM

## 2015-05-29 DIAGNOSIS — Z8639 Personal history of other endocrine, nutritional and metabolic disease: Secondary | ICD-10-CM

## 2015-05-29 LAB — COMPREHENSIVE METABOLIC PANEL
ALBUMIN: 3.9 g/dL (ref 3.5–5.0)
ALT: 12 U/L — AB (ref 17–63)
AST: 27 U/L (ref 15–41)
Alkaline Phosphatase: 89 U/L (ref 38–126)
Anion gap: 9 (ref 5–15)
BUN: 24 mg/dL — ABNORMAL HIGH (ref 6–20)
CHLORIDE: 97 mmol/L — AB (ref 101–111)
CO2: 31 mmol/L (ref 22–32)
CREATININE: 1.69 mg/dL — AB (ref 0.61–1.24)
Calcium: 8.9 mg/dL (ref 8.9–10.3)
GFR calc non Af Amer: 34 mL/min — ABNORMAL LOW (ref >60)
GFR, EST AFRICAN AMERICAN: 40 mL/min — AB (ref >60)
Glucose, Bld: 181 mg/dL — ABNORMAL HIGH (ref 65–99)
Potassium: 3.5 mmol/L (ref 3.5–5.1)
SODIUM: 137 mmol/L (ref 135–145)
Total Bilirubin: 1.1 mg/dL (ref 0.3–1.2)
Total Protein: 6.8 g/dL (ref 6.5–8.1)

## 2015-05-29 LAB — CBC WITH DIFFERENTIAL/PLATELET
BASOS ABS: 0.1 10*3/uL (ref 0–0.1)
BASOS PCT: 1 %
EOS ABS: 0.1 10*3/uL (ref 0–0.7)
EOS PCT: 1 %
HCT: 44 % (ref 40.0–52.0)
Hemoglobin: 14.2 g/dL (ref 13.0–18.0)
Lymphocytes Relative: 10 %
Lymphs Abs: 0.5 10*3/uL — ABNORMAL LOW (ref 1.0–3.6)
MCH: 30.9 pg (ref 26.0–34.0)
MCHC: 32.3 g/dL (ref 32.0–36.0)
MCV: 95.6 fL (ref 80.0–100.0)
Monocytes Absolute: 0.4 10*3/uL (ref 0.2–1.0)
Monocytes Relative: 7 %
Neutro Abs: 4.3 10*3/uL (ref 1.4–6.5)
Neutrophils Relative %: 81 %
PLATELETS: 128 10*3/uL — AB (ref 150–440)
RBC: 4.6 MIL/uL (ref 4.40–5.90)
RDW: 15.8 % — ABNORMAL HIGH (ref 11.5–14.5)
WBC: 5.3 10*3/uL (ref 3.8–10.6)

## 2015-05-29 MED ORDER — PACLITAXEL PROTEIN-BOUND CHEMO INJECTION 100 MG
125.0000 mg/m2 | Freq: Once | INTRAVENOUS | Status: AC
  Administered 2015-05-29: 275 mg via INTRAVENOUS
  Filled 2015-05-29: qty 55

## 2015-05-29 MED ORDER — SODIUM CHLORIDE 0.9 % IV SOLN
Freq: Once | INTRAVENOUS | Status: AC
  Administered 2015-05-29: 11:00:00 via INTRAVENOUS
  Filled 2015-05-29: qty 1000

## 2015-05-29 MED ORDER — SODIUM CHLORIDE 0.9 % IV SOLN
Freq: Once | INTRAVENOUS | Status: AC
  Administered 2015-05-29: 11:00:00 via INTRAVENOUS
  Filled 2015-05-29: qty 4

## 2015-05-29 MED ORDER — PROCHLORPERAZINE MALEATE 10 MG PO TABS: 10.0000 mg | ORAL_TABLET | Freq: Four times a day (QID) | ORAL | Status: AC | PRN

## 2015-05-29 MED ORDER — GEMCITABINE HCL CHEMO INJECTION 1 GM/26.3ML
1600.0000 mg | Freq: Once | INTRAVENOUS | Status: AC
  Administered 2015-05-29: 1600 mg via INTRAVENOUS
  Filled 2015-05-29: qty 42.08

## 2015-05-29 MED ORDER — LIDOCAINE-PRILOCAINE 2.5-2.5 % EX CREA: 1.0000 "application " | TOPICAL_CREAM | CUTANEOUS | Status: AC | PRN

## 2015-05-29 NOTE — Progress Notes (Signed)
Patient would like to have a port placed and is an established patient of Dr. Lucky Cowboy, information faxed to [facility].

## 2015-05-30 ENCOUNTER — Telehealth: Payer: Self-pay

## 2015-05-30 LAB — CANCER ANTIGEN 19-9: CA 19-9: 315 U/mL — ABNORMAL HIGH (ref 0–35)

## 2015-05-30 NOTE — Telephone Encounter (Signed)
  Oncology Nurse Navigator Documentation    Navigator Encounter Type: Telephone (05/30/15 1300)                      Time Spent with Patient: 15 (05/30/15 1300)   Pt called regarding his blood sugar being in the upper 200's. States he took his glipizide and insulin this am and his level came down by 50. States he usually runs around 100-110. Educated him that he did receive some steroids with his chemo and that can elevate his level. He states he also ate sweets. He is asymtomatic. He takes another dose of glipizide at supper. Encouraged him to just keep a check on his sugar as we don't want it to go to low.

## 2015-06-02 ENCOUNTER — Other Ambulatory Visit: Payer: Self-pay | Admitting: Vascular Surgery

## 2015-06-04 ENCOUNTER — Encounter
Admission: RE | Disposition: A | Discharge status: Home / Self Care | Payer: Self-pay | Source: Ambulatory Visit | Attending: Vascular Surgery

## 2015-06-04 ENCOUNTER — Ambulatory Visit
Admission: RE | Admit: 2015-06-04 | Discharge: 2015-06-04 | Disposition: A | Discharge status: Home / Self Care | Payer: Medicare Other | Source: Ambulatory Visit | Attending: Vascular Surgery | Admitting: Vascular Surgery

## 2015-06-04 ENCOUNTER — Encounter: Payer: Self-pay | Admitting: *Deleted

## 2015-06-04 DIAGNOSIS — C259 Malignant neoplasm of pancreas, unspecified: Secondary | ICD-10-CM | POA: Insufficient documentation

## 2015-06-04 DIAGNOSIS — I10 Essential (primary) hypertension: Secondary | ICD-10-CM | POA: Diagnosis not present

## 2015-06-04 DIAGNOSIS — K219 Gastro-esophageal reflux disease without esophagitis: Secondary | ICD-10-CM | POA: Insufficient documentation

## 2015-06-04 DIAGNOSIS — Z794 Long term (current) use of insulin: Secondary | ICD-10-CM | POA: Diagnosis not present

## 2015-06-04 DIAGNOSIS — E119 Type 2 diabetes mellitus without complications: Secondary | ICD-10-CM | POA: Insufficient documentation

## 2015-06-04 DIAGNOSIS — M109 Gout, unspecified: Secondary | ICD-10-CM | POA: Insufficient documentation

## 2015-06-04 DIAGNOSIS — Z79899 Other long term (current) drug therapy: Secondary | ICD-10-CM | POA: Insufficient documentation

## 2015-06-04 HISTORY — PX: PERIPHERAL VASCULAR CATHETERIZATION: SHX172C

## 2015-06-04 LAB — GLUCOSE, CAPILLARY: Glucose-Capillary: 219 mg/dL — ABNORMAL HIGH (ref 65–99)

## 2015-06-04 SURGERY — PORTA CATH INSERTION
Anesthesia: Moderate Sedation

## 2015-06-04 MED ORDER — FENTANYL CITRATE (PF) 100 MCG/2ML IJ SOLN
INTRAMUSCULAR | Status: DC | PRN
  Administered 2015-06-04 (×2): 50 ug via INTRAVENOUS

## 2015-06-04 MED ORDER — HEPARIN (PORCINE) IN NACL 2-0.9 UNIT/ML-% IJ SOLN
INTRAMUSCULAR | Status: AC
  Filled 2015-06-04: qty 500

## 2015-06-04 MED ORDER — LIDOCAINE-EPINEPHRINE (PF) 1 %-1:200000 IJ SOLN
INTRAMUSCULAR | Status: DC | PRN
  Administered 2015-06-04: 20 mL via INTRADERMAL

## 2015-06-04 MED ORDER — SODIUM CHLORIDE 0.9 % IV SOLN
INTRAVENOUS | Status: DC
Start: 2015-06-04 — End: 2015-06-04

## 2015-06-04 MED ORDER — CLINDAMYCIN PHOSPHATE 300 MG/50ML IV SOLN
300.0000 mg | Freq: Once | INTRAVENOUS | Status: AC
  Administered 2015-06-04: 300 mg via INTRAVENOUS

## 2015-06-04 MED ORDER — SODIUM CHLORIDE 0.9 % IV SOLN
INTRAVENOUS | Status: DC
  Administered 2015-06-04 (×2): via INTRAVENOUS

## 2015-06-04 MED ORDER — CEFAZOLIN SODIUM 1-5 GM-% IV SOLN
INTRAVENOUS | Status: AC
  Filled 2015-06-04: qty 50

## 2015-06-04 MED ORDER — MIDAZOLAM HCL 2 MG/2ML IJ SOLN
INTRAMUSCULAR | Status: DC | PRN
  Administered 2015-06-04: 2 mg via INTRAVENOUS

## 2015-06-04 MED ORDER — GENTAMICIN SULFATE 40 MG/ML IJ SOLN
80.0000 mg | Freq: Once | INTRAMUSCULAR | Status: AC
  Administered 2015-06-04: 80 mg
  Filled 2015-06-04: qty 2

## 2015-06-04 MED ORDER — CLINDAMYCIN PHOSPHATE 300 MG/50ML IV SOLN
INTRAVENOUS | Status: AC
  Administered 2015-06-04: 15:00:00
  Filled 2015-06-04: qty 50

## 2015-06-04 MED ORDER — LIDOCAINE-EPINEPHRINE (PF) 1 %-1:200000 IJ SOLN
INTRAMUSCULAR | Status: AC
  Filled 2015-06-04: qty 30

## 2015-06-04 MED ORDER — INSULIN ASPART 100 UNIT/ML ~~LOC~~ SOLN: 0.0000 [IU] | SUBCUTANEOUS | Status: DC

## 2015-06-04 MED ORDER — FENTANYL CITRATE (PF) 100 MCG/2ML IJ SOLN
INTRAMUSCULAR | Status: AC
  Filled 2015-06-04: qty 2

## 2015-06-04 MED ORDER — MIDAZOLAM HCL 5 MG/5ML IJ SOLN
INTRAMUSCULAR | Status: AC
  Filled 2015-06-04: qty 5

## 2015-06-04 SURGICAL SUPPLY — 10 items
DERMABOND ADVANCED (GAUZE/BANDAGES/DRESSINGS) ×2
DERMABOND ADVANCED .7 DNX12 (GAUZE/BANDAGES/DRESSINGS) ×1 IMPLANT
ELECT CAUTERY BLADE 6.4 (BLADE) ×3 IMPLANT
PACK ANGIOGRAPHY (CUSTOM PROCEDURE TRAY) ×3 IMPLANT
PAD GROUND ADULT SPLIT (MISCELLANEOUS) ×3 IMPLANT
PORTACATH POWER 8F (Port) ×3 IMPLANT
SUT MON AB 4-0 PC3 18 (SUTURE) ×3 IMPLANT
SUT PROLENE 0 CT 1 30 (SUTURE) ×3 IMPLANT
SUTURE VIC 3-0 (SUTURE) ×3 IMPLANT
SYR TOOMEY 50ML (SYRINGE) ×3 IMPLANT

## 2015-06-04 NOTE — H&P (Signed)
  [facility] History & Physical Update  The patient was interviewed and re-examined.  The patient's previous History and Physical has been reviewed and is unchanged.  There is no change in the plan of care. We plan to proceed with the scheduled procedure.  Vincent Streater, MD  06/04/2015, 2:28 PM

## 2015-06-04 NOTE — Op Note (Signed)
      [facility]       Operative Note  Date: 06/04/2015  Preoperative diagnosis:  1. Pancreas cancer  Postoperative diagnosis:  Same as above  Procedures: #1. Ultrasound guidance for vascular access to the right internal jugular vein. #2. Fluoroscopic guidance for placement of catheter. #3. Placement of CT compatible Port-A-Cath, right internal jugular vein.  Surgeon: Leotis Pain, MD.   Anesthesia: Local with moderate conscious sedation.  Fluoroscopy time: less than 1 minute  Contrast used: 0  Estimated blood loss: minimal  Indication for the procedure:  Patient is an 81 yo WM with pancreas cancer.  The patient needs a Port-A-Cath for durable venous access, chemotherapy, lab draws, and CT scans. We are asked to place this. Risks and benefits were discussed and informed consent was obtained.  Description of procedure: The patient was brought to the vascular and interventional radiology suite. The right neck chest and shoulder were sterilely prepped and draped, and a sterile surgical field was created. Ultrasound was used to help visualize a patent right internal jugular vein. This was then accessed under direct ultrasound guidance without difficulty with the Seldinger needle and a permanent image was recorded. A J-wire was placed. After skin nick and dilatation, the peel-away sheath was then placed over the wire. I then anesthetized an area under the clavicle approximately 1-2 fingerbreadths. A transverse incision was created and an inferior pocket was created with electrocautery and blunt dissection. The port was then brought onto the field, placed into the pocket and secured to the chest wall with 2 Prolene sutures. The catheter was connected to the port and tunneled from the subclavicular incision to the access site. Fluoroscopic guidance was then used to cut the catheter to an appropriate length. The catheter was then placed through the peel-away sheath and the  peel-away sheath was removed. The catheter tip was parked in excellent location under fluorocoscopic guidance in the cavoatrial junction area. The pocket was then irrigated with antibiotic impregnated saline and the wound was closed with a running 3-0 Vicryl and a 4-0 Monocryl. The access incision was closed with a single 4-0 Monocryl. The Huber needle was used to withdraw blood and flush the port with heparinized saline. Dermabond was then placed as a dressing. The patient tolerated the procedure well and was taken to the recovery room in stable condition.   DEW,JASON 06/04/2015 3:23 PM

## 2015-06-04 NOTE — Discharge Instructions (Signed)
Insercin de un catter tunelizado - Cuidados posteriores (Tunneled Catheter Insertion, Care After) Siga estas instrucciones durante las prximas semanas. Estas indicaciones le proporcionan informacin general acerca de cmo deber cuidarse despus del procedimiento. El mdico tambin podr darle instrucciones ms especficas. El tratamiento se ha planificado de acuerdo a las prcticas mdicas actuales, pero a veces se producen problemas. Comunquese con el mdico si tiene algn problema o tiene preguntas despus del procedimiento.  [place] en su [place] del procedimiento. Es probable que Programmer, systems a sus actividades normales al da siguiente.  Siga las instrucciones especficas de su mdico sobre el tipo de dispositivo que le han implantado.  Tome slo medicamentos de venta libre o recetados, segn las indicaciones del mdico.  Mantenga limpio en todo momento el lugar de la insercin de la va central.  Retire el apsito (vendaje) que se encuentra sobre la incisin cuando lo necesite, o segn las indicaciones del mdico.  Lave la zona del catter en cada cambio de vendaje. Dese un bao de esponja con una solucin germicida (antisptico) segn las indicaciones del mdico.  Observe si hay enrojecimiento o hinchazn en el sitio de insercin durante el cambio de vendaje.  Aplique un vendaje de compresin segn le haya indicado su mdico.  Lave el catter segn las indicaciones para evitar que se obstruya.  Lvese bien las manos antes de cambiar los vendajes o Mining engineer.  Nodebe dejar que entre aire en el catter.  Nunca abra la tapa de la punta del catter.  Asegrese siempre de que no hay aire en la jeringa o en el tubo para infusiones.   No levante objetos pesados.  No conduzca vehculos hasta que el mdico lo autorice.  No se duche ni se bae hasta que el mdico lo autorice. Al ducharse o baarse, coloque un trozo de  [employer] sitio del Holiday representative. No permita que la zona del catter o el vendaje se mojen. Si toma un bao no permita que el catter quede sumergido en Wellsite geologist. Si el catter se inserta a travs de una vena del brazo:  Art therapist usar ropa ajustada o joyas en el brazo que tiene el catter.   No duerma con la cabeza sobre el brazo que tiene el catter.   No deje que le coloquen un manguito para medir la presin arterial en el brazo que tiene Print production planner.   No permita que le extraigan sangre del brazo que tiene el catter, excepto a travs del catter mismo. SOLICITE ATENCIN MDICA SI:  Tiene sangrado en el sitio de insercin del catter.   Se siente dbil o tiene nuseas.   El catter no funciona correctamente.   Observa enrojecimiento, dolor, hinchazn y calor en el sitio de insercin.   Nota que drena lquido en la zona de insercin.  SOLICITE ATENCIN MDICA DE INMEDIATO SI:  El catter se rompe o tiene un agujero.   Se afloja o se sale por completo. Si esto ocurre, haga una presin [employer] zona con la mano o con un pao limpio.   Tiene fiebre.  Tiene escalofros.   El catter se obstruye.   Tiene inflamacin en el brazo, el hombro, el cuello o el [place].   Presenta un sangrado en el sitio de insercin, que no se detiene rpidamente.   Siente dolor en el pecho o tiene dificultad para respirar.   Se siente mareado o sufre un desmayo.  ASEGRESE DE QUE:  Comprende estas instrucciones.  Controlar su afeccin.  Recibir ayuda de inmediato si no mejora o si empeora. Document Released: 08/16/2012 Document Revised: 05/02/2013 [facility] Patient Information 2015 [place]. This information is not intended to replace advice given to you by your health care provider. Make sure you discuss any questions you have with your health care provider.

## 2015-06-05 ENCOUNTER — Encounter: Payer: Self-pay | Admitting: Vascular Surgery

## 2015-06-05 ENCOUNTER — Inpatient Hospital Stay (HOSPITAL_BASED_OUTPATIENT_CLINIC_OR_DEPARTMENT_OTHER): Payer: Medicare Other | Admitting: Oncology

## 2015-06-05 ENCOUNTER — Inpatient Hospital Stay: Payer: Medicare Other

## 2015-06-05 DIAGNOSIS — E039 Hypothyroidism, unspecified: Secondary | ICD-10-CM

## 2015-06-05 DIAGNOSIS — I739 Peripheral vascular disease, unspecified: Secondary | ICD-10-CM

## 2015-06-05 DIAGNOSIS — Z8639 Personal history of other endocrine, nutritional and metabolic disease: Secondary | ICD-10-CM

## 2015-06-05 DIAGNOSIS — D696 Thrombocytopenia, unspecified: Secondary | ICD-10-CM

## 2015-06-05 DIAGNOSIS — R944 Abnormal results of kidney function studies: Secondary | ICD-10-CM

## 2015-06-05 DIAGNOSIS — I129 Hypertensive chronic kidney disease with stage 1 through stage 4 chronic kidney disease, or unspecified chronic kidney disease: Secondary | ICD-10-CM

## 2015-06-05 DIAGNOSIS — C250 Malignant neoplasm of head of pancreas: Secondary | ICD-10-CM

## 2015-06-05 DIAGNOSIS — Z79899 Other long term (current) drug therapy: Secondary | ICD-10-CM

## 2015-06-05 DIAGNOSIS — E119 Type 2 diabetes mellitus without complications: Secondary | ICD-10-CM

## 2015-06-05 DIAGNOSIS — C252 Malignant neoplasm of tail of pancreas: Secondary | ICD-10-CM

## 2015-06-05 DIAGNOSIS — C259 Malignant neoplasm of pancreas, unspecified: Secondary | ICD-10-CM | POA: Diagnosis not present

## 2015-06-05 DIAGNOSIS — Z794 Long term (current) use of insulin: Secondary | ICD-10-CM

## 2015-06-05 DIAGNOSIS — R978 Other abnormal tumor markers: Secondary | ICD-10-CM

## 2015-06-05 DIAGNOSIS — Z950 Presence of cardiac pacemaker: Secondary | ICD-10-CM

## 2015-06-05 DIAGNOSIS — I495 Sick sinus syndrome: Secondary | ICD-10-CM

## 2015-06-05 DIAGNOSIS — N189 Chronic kidney disease, unspecified: Secondary | ICD-10-CM

## 2015-06-05 DIAGNOSIS — K219 Gastro-esophageal reflux disease without esophagitis: Secondary | ICD-10-CM

## 2015-06-05 DIAGNOSIS — G629 Polyneuropathy, unspecified: Secondary | ICD-10-CM

## 2015-06-05 DIAGNOSIS — K4090 Unilateral inguinal hernia, without obstruction or gangrene, not specified as recurrent: Secondary | ICD-10-CM

## 2015-06-05 DIAGNOSIS — Z85828 Personal history of other malignant neoplasm of skin: Secondary | ICD-10-CM

## 2015-06-05 DIAGNOSIS — I4891 Unspecified atrial fibrillation: Secondary | ICD-10-CM

## 2015-06-05 LAB — CBC WITH DIFFERENTIAL/PLATELET
BASOS ABS: 0 10*3/uL (ref 0–0.1)
BASOS PCT: 1 %
EOS ABS: 0.1 10*3/uL (ref 0–0.7)
Eosinophils Relative: 3 %
HEMATOCRIT: 42 % (ref 40.0–52.0)
HEMOGLOBIN: 13.7 g/dL (ref 13.0–18.0)
Lymphocytes Relative: 10 %
Lymphs Abs: 0.3 10*3/uL — ABNORMAL LOW (ref 1.0–3.6)
MCH: 30.6 pg (ref 26.0–34.0)
MCHC: 32.6 g/dL (ref 32.0–36.0)
MCV: 94.1 fL (ref 80.0–100.0)
MONO ABS: 0.1 10*3/uL — AB (ref 0.2–1.0)
MONOS PCT: 3 %
NEUTROS ABS: 2.1 10*3/uL (ref 1.4–6.5)
NEUTROS PCT: 83 %
Platelets: 55 10*3/uL — ABNORMAL LOW (ref 150–440)
RBC: 4.46 MIL/uL (ref 4.40–5.90)
RDW: 15.7 % — AB (ref 11.5–14.5)
WBC: 2.5 10*3/uL — ABNORMAL LOW (ref 3.8–10.6)

## 2015-06-05 LAB — COMPREHENSIVE METABOLIC PANEL
ALBUMIN: 3.4 g/dL — AB (ref 3.5–5.0)
ALT: 22 U/L (ref 17–63)
ANION GAP: 7 (ref 5–15)
AST: 26 U/L (ref 15–41)
Alkaline Phosphatase: 86 U/L (ref 38–126)
BILIRUBIN TOTAL: 1 mg/dL (ref 0.3–1.2)
BUN: 35 mg/dL — AB (ref 6–20)
CO2: 30 mmol/L (ref 22–32)
Calcium: 7.8 mg/dL — ABNORMAL LOW (ref 8.9–10.3)
Chloride: 93 mmol/L — ABNORMAL LOW (ref 101–111)
Creatinine, Ser: 1.8 mg/dL — ABNORMAL HIGH (ref 0.61–1.24)
GFR calc Af Amer: 37 mL/min — ABNORMAL LOW (ref >60)
GFR calc non Af Amer: 32 mL/min — ABNORMAL LOW (ref >60)
GLUCOSE: 260 mg/dL — AB (ref 65–99)
Potassium: 4.2 mmol/L (ref 3.5–5.1)
SODIUM: 130 mmol/L — AB (ref 135–145)
TOTAL PROTEIN: 6.4 g/dL — AB (ref 6.5–8.1)

## 2015-06-05 MED ORDER — HEPARIN SOD (PORK) LOCK FLUSH 100 UNIT/ML IV SOLN
500.0000 [IU] | Freq: Once | INTRAVENOUS | Status: AC
  Administered 2015-06-05: 500 [IU] via INTRAVENOUS
  Filled 2015-06-05: qty 5

## 2015-06-05 MED ORDER — SODIUM CHLORIDE 0.9 % IJ SOLN
10.0000 mL | INTRAMUSCULAR | Status: AC | PRN
  Filled 2015-06-05: qty 10

## 2015-06-05 NOTE — Progress Notes (Signed)
Patient had constipation over the weekend and had slight relief with Miralax so he did an enema and got total relief.  He is taking the Miralax now in hopes to prevent constipation.  Has a change in taste with a slight decrease in appetite and I informed him of our nutrition program that may help with those side effects if he has an interest in the future to let us know.

## 2015-06-07 NOTE — Progress Notes (Signed)
[place]  Telephone:(336) [phone] [phone]) [phone]  ID: [name] OB: [birth year]  MR#: [id number]  PYK#:[id number]  [name], MD as PCP - General (Family Medicine) [name], MD as Referring Physician (Vascular Surgery) [name], MD as Consulting Physician (Cardiology) [name], MD as Consulting Physician (Hematology and Oncology) Clent Jacks, RN as Registered Nurse  CHIEF COMPLAINT:  Chief Complaint  Patient presents with  . Pancreatic Cancer  . Chemotherapy    INTERVAL HISTORY: Patient returns to clinic today for further evaluation and consideration of cycle 1, day 8 of gemcitabine and Abraxane. He tolerated his first treatment well without significant side effects. He also received a poor yesterday. He currently feels well and is asymptomatic. He has no neurologic complaints. He denies any recent fevers. He denies any chest pain or shortness of breath.  He has a good appetite and denies any nausea, vomiting, constipation, or diarrhea. He has no changes in his bowel movements and denies any melanotic or hematochezia. He has no urinary complaints. Patient offers no specific complaints today.  REVIEW OF SYSTEMS:   Review of Systems  Constitutional: Negative.  Negative for weight loss.  Respiratory: Negative.   Cardiovascular: Negative.   Gastrointestinal: Negative for abdominal pain.    As per HPI. Otherwise, a complete review of systems is negatve.  PAST MEDICAL HISTORY: Past Medical History  Diagnosis Date  . Diabetes mellitus without complication   . Hypertension   . PAD (peripheral artery disease)   . Gout   . Hypothyroidism   . History of chicken pox   . History of measles as a child   . History of mumps as a child   . SSS (sick sinus syndrome)   . Chronic kidney disease     ckd  . GERD (gastroesophageal reflux disease)   . Neuropathy   . Dysrhythmia     a fib  . Cancer     Skin cancer    . Pancreatic mass 04/2015    scheduled for biopsy on 05/08/2015    PAST SURGICAL HISTORY: Past Surgical History  Procedure Laterality Date  . Carotid endarterectomy Right 2015    Dr. Lucky Cowboy  . Heart stents  2015    one stent per patient  . Bilateral leg stents Bilateral 2015    Bilateral iliac and right popliteal  . Skin cancer excision    . Cataract extraction Bilateral 09/2013  . Angioplasty  2009, 2010    done by Dr. Hulda Humphrey and Dr. Dian Situ  . Eye surgery    . Coronary angioplasty  2015  . Appendectomy  1962  . Pacemaker insertion N/A 04/30/2015    Procedure: INSERTION PACEMAKER;  Surgeon: Isaias Cowman, MD;  Location: [facility] ORS;  Service: Cardiovascular;  Laterality: N/A;  . Upper esophageal endoscopic ultrasound (eus) N/A 05/08/2015    Procedure: UPPER ESOPHAGEAL ENDOSCOPIC ULTRASOUND (EUS);  Surgeon: Cora Daniels, MD;  Location: [facility] ENDOSCOPY;  Service: Endoscopy;  Laterality: N/A;  . Peripheral vascular catheterization N/A 06/04/2015    Procedure: Porta Cath Insertion;  Surgeon: [name], MD;  Location: [facility];  Service: Cardiovascular;  Laterality: N/A;    FAMILY HISTORY Family History  Problem Relation Age of Onset  . Diabetes Mother   . Cancer Sister     Uterine  . Arthritis Father   . Kidney disease Father        ADVANCED DIRECTIVES:    HEALTH MAINTENANCE:  Social History  Substance Use Topics  . Smoking status: Never Smoker   . Smokeless tobacco: Never Used  . Alcohol Use: No     Colonoscopy:  PAP:  Bone density:  Lipid panel:  Allergies  Allergen Reactions  . Adhesive [Tape] Other (See Comments)    Patient states that adhesive tape causes his skin to tear very easily and asks for paper tape ONLY  . Crestor [Rosuvastatin Calcium] Rash  . Penicillins Rash    Current Outpatient Prescriptions  Medication Sig Dispense Refill  . allopurinol (ZYLOPRIM) 100 MG tablet TAKE 2 TABLETS BY MOUTH EVERY MORNING 60 tablet 11  .  bumetanide (BUMEX) 2 MG tablet Take 2 mg by mouth daily. 1 or 2 tablets AS NEEDED    . clopidogrel (PLAVIX) 75 MG tablet     . gabapentin (NEURONTIN) 300 MG capsule Take 3 capsules (900 mg total) by mouth at bedtime. 90 capsule 5  . Garlic 1497 MG CAPS Take by mouth daily.    Marland Kitchen glipiZIDE (GLUCOTROL) 5 MG tablet Take by mouth 2 (two) times daily before a meal.    . Insulin Glargine (TOUJEO SOLOSTAR) 300 UNIT/ML SOPN Inject 10 Units into the skin daily.    Marland Kitchen levothyroxine (SYNTHROID, LEVOTHROID) 112 MCG tablet Take 112 mcg by mouth daily before breakfast.    . lidocaine-prilocaine (EMLA) cream Apply 1 application topically as needed. Apply to port then cover with saran wrap 1-2 hours before chemotherapy appointment 30 g 2  . lisinopril (PRINIVIL,ZESTRIL) 10 MG tablet Take 5 mg by mouth daily.     . montelukast (SINGULAIR) 10 MG tablet Take 10 mg by mouth at bedtime.    . Omega-3 Fatty Acids (FISH OIL) 1000 MG CAPS Take by mouth 2 (two) times daily.    Marland Kitchen oxyCODONE-acetaminophen (PERCOCET/ROXICET) 5-325 MG per tablet Take 1 tablet by mouth every 8 (eight) hours as needed for moderate pain. 30 tablet 0  . pravastatin (PRAVACHOL) 40 MG tablet Take 40 mg by mouth daily.    . prochlorperazine (COMPAZINE) 10 MG tablet Take 1 tablet (10 mg total) by mouth every 6 (six) hours as needed for nausea or vomiting. 30 tablet 2  . ranitidine (ZANTAC) 150 MG tablet Take 150 mg by mouth daily. 1-2 tabs daily    . silver sulfADIAZINE (SILVADENE) 1 % cream Apply 1 application topically 2 (two) times daily. to affected area    . vitamin B-12 (CYANOCOBALAMIN) 1000 MCG tablet Inject 1,000 mcg into the muscle every 30 (thirty) days.     No current facility-administered medications for this visit.   Facility-Administered Medications Ordered in Other Visits  Medication Dose Route Frequency Provider Last Rate Last Dose  . sodium chloride 0.9 % injection 10 mL  10 mL Intravenous PRN Lloyd Huger, MD         OBJECTIVE: There were no vitals filed for this visit.   There is no weight on file to calculate BMI.    ECOG FS:1 - Symptomatic but completely ambulatory  General: Well-developed, well-nourished, no acute distress. Eyes: Pink conjunctiva, anicteric sclera. Lungs: Clear to auscultation bilaterally. Heart: Regular rate and rhythm. No rubs, murmurs, or gallops. Abdomen: Soft, nontender, nondistended. No organomegaly noted, normoactive bowel sounds. Musculoskeletal: No edema, cyanosis, or clubbing. Neuro: Alert, answering all questions appropriately. Cranial nerves grossly intact. Skin: No rashes or petechiae noted. Psych: Normal affect.  LAB RESULTS:  Lab Results  Component Value Date   NA 130* 06/05/2015   K 4.2 06/05/2015   CL  93* 06/05/2015   CO2 30 06/05/2015   GLUCOSE 260* 06/05/2015   BUN 35* 06/05/2015   CREATININE 1.80* 06/05/2015   CALCIUM 7.8* 06/05/2015   PROT 6.4* 06/05/2015   ALBUMIN 3.4* 06/05/2015   AST 26 06/05/2015   ALT 22 06/05/2015   ALKPHOS 86 06/05/2015   BILITOT 1.0 06/05/2015   GFRNONAA 32* 06/05/2015   GFRAA 37* 06/05/2015    Lab Results  Component Value Date   WBC 2.5* 06/05/2015   NEUTROABS 2.1 06/05/2015   HGB 13.7 06/05/2015   HCT 42.0 06/05/2015   MCV 94.1 06/05/2015   PLT 55* 06/05/2015     STUDIES: Dg Abd 2 Views  05/09/2015   CLINICAL DATA:  Gastric perforation.  EXAM: ABDOMEN - 2 VIEW  COMPARISON:  None.  FINDINGS: Cardiac pacer. Surgical clips upper abdomen. No bowel distention. No free air. Nondilated loops of bowel are noted. Vascular calcification with right femoral vascular stents noted.  IMPRESSION: No evidence of significant bowel obstruction or free air. Oral contrast is noted throughout the colon. The stomach is nondistended. No abnormal perigastric free air or contrast collections are noted.   Electronically Signed   By: Marcello Moores  Register   On: 05/09/2015 11:47    ASSESSMENT: Stage III adenocarcinoma of the  pancreas.  PLAN:    1. Pancreatic mass: PET scan results reviewed independently. CA-19-9 is elevated at 315.  EUS confirmed pancreatic adenocarcinoma. Patient is not a surgical candidate.  Wraps on delay cycle 1, day 8 of gemcitabine and Abraxane secondary to thrombocytopenia. Patient had a port placed yesterday and tolerated the procedure well.  Return to clinic in 1 week for reconsideration of treatment. Patient may only be able to tolerate treatment on days 1 and 15.  Will reimage after 3 cycles.  2. Pain: Continue fentanyl patch and oxycodone as needed.  3. Thrombocytopenia: Secondary to chemotherapy, monitor. 4. Elevated creatinine: Approximately patient's baseline, monitor.  Patient expressed understanding and was in agreement with this plan. He also understands that He can call clinic at any time with any questions, concerns, or complaints.   Lloyd Huger, MD   06/07/2015 7:04 PM

## 2015-06-07 NOTE — Progress Notes (Signed)
[place]  Telephone:(336) [phone] Fax:(336) [phone]  ID: [name] OB: 12-15-1924  MR#: [id number]  EXB#:[id number]  [name], MD as PCP - General (Family Medicine) [name], MD as Referring Physician (Vascular Surgery) [name], MD as Consulting Physician (Cardiology) [name], MD as Consulting Physician (Hematology and Oncology) Clent Jacks, RN as Registered Nurse  CHIEF COMPLAINT:  Chief Complaint  Patient presents with  . Follow-up    pancreatic mass  . Chemotherapy    initial treatment    INTERVAL HISTORY: Patient returns to clinic today for further evaluation and initiation of cycle 1, day 1 of gemcitabine and Abraxane. He currently feels well and is asymptomatic. He has no neurologic complaints. He denies any weight loss. He denies any recent fevers. He denies any chest pain or shortness of breath.  He has a good appetite and denies any nausea, vomiting, constipation, or diarrhea. He has no changes in his bowel movements and denies any melanotic or hematochezia. He has no urinary complaints. Patient offers no specific complaints today.  REVIEW OF SYSTEMS:   Review of Systems  Constitutional: Negative.   Respiratory: Negative.   Cardiovascular: Negative.   Gastrointestinal: Negative for abdominal pain.    As per HPI. Otherwise, a complete review of systems is negatve.  PAST MEDICAL HISTORY: Past Medical History  Diagnosis Date  . Diabetes mellitus without complication   . Hypertension   . PAD (peripheral artery disease)   . Gout   . Hypothyroidism   . History of chicken pox   . History of measles as a child   . History of mumps as a child   . SSS (sick sinus syndrome)   . Chronic kidney disease     ckd  . GERD (gastroesophageal reflux disease)   . Neuropathy   . Dysrhythmia     a fib  . Cancer     Skin cancer  . Pancreatic mass 04/2015    scheduled for biopsy on 05/08/2015    PAST  SURGICAL HISTORY: Past Surgical History  Procedure Laterality Date  . Carotid endarterectomy Right 2015    Dr. Lucky Cowboy  . Heart stents  2015    one stent per patient  . Bilateral leg stents Bilateral 2015    Bilateral iliac and right popliteal  . Skin cancer excision    . Cataract extraction Bilateral 09/2013  . Angioplasty  2009, 2010    done by Dr. Hulda Humphrey and Dr. Dian Situ  . Eye surgery    . Coronary angioplasty  2015  . Appendectomy  1962  . Pacemaker insertion N/A 04/30/2015    Procedure: INSERTION PACEMAKER;  Surgeon: Isaias Cowman, MD;  Location: [facility] ORS;  Service: Cardiovascular;  Laterality: N/A;  . Upper esophageal endoscopic ultrasound (eus) N/A 05/08/2015    Procedure: UPPER ESOPHAGEAL ENDOSCOPIC ULTRASOUND (EUS);  Surgeon: Cora Daniels, MD;  Location: [facility] ENDOSCOPY;  Service: Endoscopy;  Laterality: N/A;  . Peripheral vascular catheterization N/A 06/04/2015    Procedure: Porta Cath Insertion;  Surgeon: [name], MD;  Location: [place] [facility];  Service: Cardiovascular;  Laterality: N/A;    FAMILY HISTORY Family History  Problem Relation Age of Onset  . Diabetes Mother   . Cancer Sister     Uterine  . Arthritis Father   . Kidney disease Father        ADVANCED DIRECTIVES:    HEALTH MAINTENANCE: Social History  Substance Use Topics  .  Smoking status: Never Smoker   . Smokeless tobacco: Never Used  . Alcohol Use: No     Colonoscopy:  PAP:  Bone density:  Lipid panel:  Allergies  Allergen Reactions  . Adhesive [Tape] Other (See Comments)    Patient states that adhesive tape causes his skin to tear very easily and asks for paper tape ONLY  . Crestor [Rosuvastatin Calcium] Rash  . Penicillins Rash    Current Outpatient Prescriptions  Medication Sig Dispense Refill  . allopurinol (ZYLOPRIM) 100 MG tablet TAKE 2 TABLETS BY MOUTH EVERY MORNING 60 tablet 11  . bumetanide (BUMEX) 2 MG tablet Take 2 mg by mouth daily. 1 or 2 tablets AS NEEDED     . clopidogrel (PLAVIX) 75 MG tablet     . gabapentin (NEURONTIN) 300 MG capsule Take 3 capsules (900 mg total) by mouth at bedtime. 90 capsule 5  . Garlic 5427 MG CAPS Take by mouth daily.    Marland Kitchen glipiZIDE (GLUCOTROL) 5 MG tablet Take by mouth 2 (two) times daily before a meal.    . Insulin Glargine (TOUJEO SOLOSTAR) 300 UNIT/ML SOPN Inject 10 Units into the skin daily.    Marland Kitchen levothyroxine (SYNTHROID, LEVOTHROID) 112 MCG tablet Take 112 mcg by mouth daily before breakfast.    . lisinopril (PRINIVIL,ZESTRIL) 10 MG tablet Take 5 mg by mouth daily.     . montelukast (SINGULAIR) 10 MG tablet Take 10 mg by mouth at bedtime.    . Omega-3 Fatty Acids (FISH OIL) 1000 MG CAPS Take by mouth 2 (two) times daily.    Marland Kitchen oxyCODONE-acetaminophen (PERCOCET/ROXICET) 5-325 MG per tablet Take 1 tablet by mouth every 8 (eight) hours as needed for moderate pain. 30 tablet 0  . pravastatin (PRAVACHOL) 40 MG tablet Take 40 mg by mouth daily.    . ranitidine (ZANTAC) 150 MG tablet Take 150 mg by mouth daily. 1-2 tabs daily    . silver sulfADIAZINE (SILVADENE) 1 % cream Apply 1 application topically 2 (two) times daily. to affected area    . vitamin B-12 (CYANOCOBALAMIN) 1000 MCG tablet Inject 1,000 mcg into the muscle every 30 (thirty) days.    Marland Kitchen lidocaine-prilocaine (EMLA) cream Apply 1 application topically as needed. Apply to port then cover with saran wrap 1-2 hours before chemotherapy appointment 30 g 2  . prochlorperazine (COMPAZINE) 10 MG tablet Take 1 tablet (10 mg total) by mouth every 6 (six) hours as needed for nausea or vomiting. 30 tablet 2   No current facility-administered medications for this visit.   Facility-Administered Medications Ordered in Other Visits  Medication Dose Route Frequency Provider Last Rate Last Dose  . sodium chloride 0.9 % injection 10 mL  10 mL Intravenous PRN Lloyd Huger, MD        OBJECTIVE: Filed Vitals:   05/29/15 1006  BP: 109/71  Pulse: 81  Temp: 96.4 F (35.8  C)  Resp: 16     Body mass index is 25.98 kg/(m^2).    ECOG FS:1 - Symptomatic but completely ambulatory  General: Well-developed, well-nourished, no acute distress. Eyes: Pink conjunctiva, anicteric sclera. Lungs: Clear to auscultation bilaterally. Heart: Regular rate and rhythm. No rubs, murmurs, or gallops. Abdomen: Soft, nontender, nondistended. No organomegaly noted, normoactive bowel sounds. Musculoskeletal: No edema, cyanosis, or clubbing. Neuro: Alert, answering all questions appropriately. Cranial nerves grossly intact. Skin: No rashes or petechiae noted. Psych: Normal affect.  LAB RESULTS:  Lab Results  Component Value Date   NA 130* 06/05/2015   K 4.2  06/05/2015   CL 93* 06/05/2015   CO2 30 06/05/2015   GLUCOSE 260* 06/05/2015   BUN 35* 06/05/2015   CREATININE 1.80* 06/05/2015   CALCIUM 7.8* 06/05/2015   PROT 6.4* 06/05/2015   ALBUMIN 3.4* 06/05/2015   AST 26 06/05/2015   ALT 22 06/05/2015   ALKPHOS 86 06/05/2015   BILITOT 1.0 06/05/2015   GFRNONAA 32* 06/05/2015   GFRAA 37* 06/05/2015    Lab Results  Component Value Date   WBC 2.5* 06/05/2015   NEUTROABS 2.1 06/05/2015   HGB 13.7 06/05/2015   HCT 42.0 06/05/2015   MCV 94.1 06/05/2015   PLT 55* 06/05/2015     STUDIES: Dg Abd 2 Views  05/09/2015   CLINICAL DATA:  Gastric perforation.  EXAM: ABDOMEN - 2 VIEW  COMPARISON:  None.  FINDINGS: Cardiac pacer. Surgical clips upper abdomen. No bowel distention. No free air. Nondilated loops of bowel are noted. Vascular calcification with right femoral vascular stents noted.  IMPRESSION: No evidence of significant bowel obstruction or free air. Oral contrast is noted throughout the colon. The stomach is nondistended. No abnormal perigastric free air or contrast collections are noted.   Electronically Signed   By: Marcello Moores  Register   On: 05/09/2015 11:47    ASSESSMENT: Stage III adenocarcinoma of the pancreas.  PLAN:    1. Pancreatic mass: PET scan results  reviewed independently. CA-19-9 is elevated at 315.  EUS confirmed pancreatic adenocarcinoma. Patient is not a surgical candidate.  Proceed with cycle 1, day 1 of gemcitabine and Abraxane. Patient has decided on a port and is scheduled prior to his next infusion. Return to clinic in 1 week for consideration of cycle 1, day 8. Plan is to give treatment on days 1, 8, and 15 with day 22 off. Will reimage after 3 cycles.  2. Pain: Continue fentanyl patch and oxycodone as needed.   Patient expressed understanding and was in agreement with this plan. He also understands that He can call clinic at any time with any questions, concerns, or complaints.   Lloyd Huger, MD   06/07/2015 7:00 PM

## 2015-06-12 ENCOUNTER — Inpatient Hospital Stay: Payer: Medicare Other

## 2015-06-12 ENCOUNTER — Inpatient Hospital Stay: Payer: Medicare Other | Admitting: Oncology

## 2015-06-13 ENCOUNTER — Inpatient Hospital Stay: Payer: Medicare Other

## 2015-06-13 ENCOUNTER — Inpatient Hospital Stay (HOSPITAL_BASED_OUTPATIENT_CLINIC_OR_DEPARTMENT_OTHER): Payer: Medicare Other | Admitting: Family Medicine

## 2015-06-13 ENCOUNTER — Ambulatory Visit: Payer: Self-pay

## 2015-06-13 VITALS — BP 105/66 | HR 80 | Temp 96.5°F | Resp 18 | Wt 196.2 lb

## 2015-06-13 DIAGNOSIS — Z8639 Personal history of other endocrine, nutritional and metabolic disease: Secondary | ICD-10-CM

## 2015-06-13 DIAGNOSIS — R944 Abnormal results of kidney function studies: Secondary | ICD-10-CM | POA: Diagnosis not present

## 2015-06-13 DIAGNOSIS — K219 Gastro-esophageal reflux disease without esophagitis: Secondary | ICD-10-CM

## 2015-06-13 DIAGNOSIS — Z794 Long term (current) use of insulin: Secondary | ICD-10-CM

## 2015-06-13 DIAGNOSIS — I739 Peripheral vascular disease, unspecified: Secondary | ICD-10-CM

## 2015-06-13 DIAGNOSIS — Z85828 Personal history of other malignant neoplasm of skin: Secondary | ICD-10-CM

## 2015-06-13 DIAGNOSIS — R978 Other abnormal tumor markers: Secondary | ICD-10-CM

## 2015-06-13 DIAGNOSIS — C250 Malignant neoplasm of head of pancreas: Secondary | ICD-10-CM

## 2015-06-13 DIAGNOSIS — I4891 Unspecified atrial fibrillation: Secondary | ICD-10-CM

## 2015-06-13 DIAGNOSIS — E039 Hypothyroidism, unspecified: Secondary | ICD-10-CM

## 2015-06-13 DIAGNOSIS — I129 Hypertensive chronic kidney disease with stage 1 through stage 4 chronic kidney disease, or unspecified chronic kidney disease: Secondary | ICD-10-CM

## 2015-06-13 DIAGNOSIS — N189 Chronic kidney disease, unspecified: Secondary | ICD-10-CM

## 2015-06-13 DIAGNOSIS — D696 Thrombocytopenia, unspecified: Secondary | ICD-10-CM

## 2015-06-13 DIAGNOSIS — I495 Sick sinus syndrome: Secondary | ICD-10-CM

## 2015-06-13 DIAGNOSIS — C259 Malignant neoplasm of pancreas, unspecified: Secondary | ICD-10-CM

## 2015-06-13 DIAGNOSIS — Z79899 Other long term (current) drug therapy: Secondary | ICD-10-CM

## 2015-06-13 DIAGNOSIS — G629 Polyneuropathy, unspecified: Secondary | ICD-10-CM

## 2015-06-13 DIAGNOSIS — E119 Type 2 diabetes mellitus without complications: Secondary | ICD-10-CM

## 2015-06-13 DIAGNOSIS — K4090 Unilateral inguinal hernia, without obstruction or gangrene, not specified as recurrent: Secondary | ICD-10-CM

## 2015-06-13 DIAGNOSIS — Z950 Presence of cardiac pacemaker: Secondary | ICD-10-CM

## 2015-06-13 LAB — COMPREHENSIVE METABOLIC PANEL
ALBUMIN: 3.3 g/dL — AB (ref 3.5–5.0)
ALK PHOS: 97 U/L (ref 38–126)
ALT: 24 U/L (ref 17–63)
AST: 32 U/L (ref 15–41)
Anion gap: 6 (ref 5–15)
BUN: 17 mg/dL (ref 6–20)
CHLORIDE: 95 mmol/L — AB (ref 101–111)
CO2: 30 mmol/L (ref 22–32)
Calcium: 7.7 mg/dL — ABNORMAL LOW (ref 8.9–10.3)
Creatinine, Ser: 1.36 mg/dL — ABNORMAL HIGH (ref 0.61–1.24)
GFR calc Af Amer: 52 mL/min — ABNORMAL LOW (ref >60)
GFR calc non Af Amer: 45 mL/min — ABNORMAL LOW (ref >60)
GLUCOSE: 210 mg/dL — AB (ref 65–99)
Potassium: 4.2 mmol/L (ref 3.5–5.1)
SODIUM: 131 mmol/L — AB (ref 135–145)
TOTAL PROTEIN: 6.1 g/dL — AB (ref 6.5–8.1)
Total Bilirubin: 0.7 mg/dL (ref 0.3–1.2)

## 2015-06-13 LAB — CBC WITH DIFFERENTIAL/PLATELET
BASOS ABS: 0.1 10*3/uL (ref 0–0.1)
Basophils Relative: 3 %
EOS ABS: 0.1 10*3/uL (ref 0–0.7)
EOS PCT: 5 %
HCT: 38.7 % — ABNORMAL LOW (ref 40.0–52.0)
HEMOGLOBIN: 12.7 g/dL — AB (ref 13.0–18.0)
LYMPHS ABS: 0.5 10*3/uL — AB (ref 1.0–3.6)
Lymphocytes Relative: 24 %
MCH: 31.2 pg (ref 26.0–34.0)
MCHC: 32.7 g/dL (ref 32.0–36.0)
MCV: 95.2 fL (ref 80.0–100.0)
Monocytes Absolute: 0.4 10*3/uL (ref 0.2–1.0)
Monocytes Relative: 23 %
NEUTROS PCT: 45 %
Neutro Abs: 0.9 10*3/uL — ABNORMAL LOW (ref 1.4–6.5)
PLATELETS: 176 10*3/uL (ref 150–440)
RBC: 4.07 MIL/uL — AB (ref 4.40–5.90)
RDW: 16 % — ABNORMAL HIGH (ref 11.5–14.5)
WBC: 1.9 10*3/uL — AB (ref 3.8–10.6)

## 2015-06-13 MED ORDER — HEPARIN SOD (PORK) LOCK FLUSH 100 UNIT/ML IV SOLN
500.0000 [IU] | Freq: Once | INTRAVENOUS | Status: AC | PRN
  Administered 2015-06-13: 500 [IU]

## 2015-06-13 MED ORDER — HEPARIN SOD (PORK) LOCK FLUSH 100 UNIT/ML IV SOLN
INTRAVENOUS | Status: AC
  Filled 2015-06-13: qty 5

## 2015-06-13 MED ORDER — SODIUM CHLORIDE 0.9 % IJ SOLN
10.0000 mL | INTRAMUSCULAR | Status: DC | PRN
  Administered 2015-06-13: 10 mL
  Filled 2015-06-13: qty 10

## 2015-06-13 NOTE — Progress Notes (Signed)
[facility]  Telephone:([phone]) [phone]  Fax:([phone]) [phone]     [name] DOB: [birth year]  MR#: [id number]  CNO#:[id number]  [name], MD as PCP - General (Family Medicine) [name], MD as Referring Physician (Vascular Surgery) [name], MD as Consulting Physician (Cardiology) [name], MD as Consulting Physician (Hematology and Oncology) Clent Jacks, RN as Registered Nurse  CHIEF COMPLAINT:  Chief Complaint  Patient presents with  . Pancreatic Cancer    no complaints.   Patient is here for continued treatment with gemcitabine and Abraxane for pancreatic cancer.  INTERVAL HISTORY:  Patient is here for further follow-up and treatment consideration regarding pancreatic cancer. He is for gemcitabine and Abraxane today. Patient has been delayed several weeks due to low blood counts and low platelets. He reports today feeling fairly well. Reports appetite has improved and is stable. He continues with some dull persistent right upper and lower quadrant pain. He is in wheelchair today due to difficulty with long distance of ambulation.  REVIEW OF SYSTEMS:   Review of Systems  Constitutional: Negative for fever, chills, weight loss, malaise/fatigue and diaphoresis.  HENT: Negative for congestion, ear discharge, ear pain, hearing loss, nosebleeds, sore throat and tinnitus.   Eyes: Negative for blurred vision, double vision, photophobia, pain, discharge and redness.  Respiratory: Negative for cough, hemoptysis, sputum production, shortness of breath, wheezing and stridor.   Cardiovascular: Negative for chest pain, palpitations, orthopnea, claudication, leg swelling and PND.  Gastrointestinal: Positive for abdominal pain. Negative for heartburn, nausea, vomiting, diarrhea, constipation, blood in stool and melena.  Genitourinary: Negative.   Musculoskeletal: Negative.   Skin: Negative.   Neurological: Negative for dizziness,  tingling, focal weakness, seizures, weakness and headaches.  Endo/Heme/Allergies: Does not bruise/bleed easily.  Psychiatric/Behavioral: Negative for depression. The patient is not nervous/anxious and does not have insomnia.     As per HPI. Otherwise, a complete review of systems is negatve.   PAST MEDICAL HISTORY: Past Medical History  Diagnosis Date  . Diabetes mellitus without complication   . Hypertension   . PAD (peripheral artery disease)   . Gout   . Hypothyroidism   . History of chicken pox   . History of measles as a child   . History of mumps as a child   . SSS (sick sinus syndrome)   . Chronic kidney disease     ckd  . GERD (gastroesophageal reflux disease)   . Neuropathy   . Dysrhythmia     a fib  . Cancer     Skin cancer  . Pancreatic mass 04/2015    scheduled for biopsy on 05/08/2015    PAST SURGICAL HISTORY: Past Surgical History  Procedure Laterality Date  . Carotid endarterectomy Right 2015    Dr. Lucky Cowboy  . Heart stents  2015    one stent per patient  . Bilateral leg stents Bilateral 2015    Bilateral iliac and right popliteal  . Skin cancer excision    . Cataract extraction Bilateral 09/2013  . Angioplasty  2009, 2010    done by Dr. Hulda Humphrey and Dr. Dian Situ  . Eye surgery    . Coronary angioplasty  2015  . Appendectomy  1962  . Pacemaker insertion N/A 04/30/2015    Procedure: INSERTION PACEMAKER;  Surgeon: Isaias Cowman, MD;  Location: [facility] ORS;  Service: Cardiovascular;  Laterality: N/A;  . Upper esophageal endoscopic ultrasound (eus) N/A 05/08/2015    Procedure: UPPER ESOPHAGEAL ENDOSCOPIC  ULTRASOUND (EUS);  Surgeon: Cora Daniels, MD;  Location: [facility] ENDOSCOPY;  Service: Endoscopy;  Laterality: N/A;  . Peripheral vascular catheterization N/A 06/04/2015    Procedure: Porta Cath Insertion;  Surgeon: [name], MD;  Location: [facility];  Service: Cardiovascular;  Laterality: N/A;    FAMILY HISTORY Family History  Problem  Relation Age of Onset  . Diabetes Mother   . Cancer Sister     Uterine  . Arthritis Father   . Kidney disease Father     GYNECOLOGIC HISTORY:  No LMP for male patient.     ADVANCED DIRECTIVES:    HEALTH MAINTENANCE: Social History  Substance Use Topics  . Smoking status: Never Smoker   . Smokeless tobacco: Never Used  . Alcohol Use: No     Colonoscopy:  PAP:  Bone density:  Lipid panel:  Allergies  Allergen Reactions  . Adhesive [Tape] Other (See Comments)    Patient states that adhesive tape causes his skin to tear very easily and asks for paper tape ONLY  . Crestor [Rosuvastatin Calcium] Rash  . Penicillins Rash    Current Outpatient Prescriptions  Medication Sig Dispense Refill  . allopurinol (ZYLOPRIM) 100 MG tablet TAKE 2 TABLETS BY MOUTH EVERY MORNING 60 tablet 11  . bumetanide (BUMEX) 2 MG tablet Take 2 mg by mouth daily. 1 or 2 tablets AS NEEDED    . clopidogrel (PLAVIX) 75 MG tablet     . gabapentin (NEURONTIN) 300 MG capsule Take 3 capsules (900 mg total) by mouth at bedtime. 90 capsule 5  . Garlic 7035 MG CAPS Take by mouth daily.    Marland Kitchen glipiZIDE (GLUCOTROL) 5 MG tablet Take by mouth 2 (two) times daily before a meal.    . Insulin Glargine (TOUJEO SOLOSTAR) 300 UNIT/ML SOPN Inject 10 Units into the skin daily.    Marland Kitchen levothyroxine (SYNTHROID, LEVOTHROID) 112 MCG tablet Take 112 mcg by mouth daily before breakfast.    . lidocaine-prilocaine (EMLA) cream Apply 1 application topically as needed. Apply to port then cover with saran wrap 1-2 hours before chemotherapy appointment 30 g 2  . lisinopril (PRINIVIL,ZESTRIL) 10 MG tablet Take 5 mg by mouth daily.     . montelukast (SINGULAIR) 10 MG tablet Take 10 mg by mouth at bedtime.    . Omega-3 Fatty Acids (FISH OIL) 1000 MG CAPS Take by mouth 2 (two) times daily.    Marland Kitchen oxyCODONE-acetaminophen (PERCOCET/ROXICET) 5-325 MG per tablet Take 1 tablet by mouth every 8 (eight) hours as needed for moderate pain. 30 tablet 0    . pravastatin (PRAVACHOL) 40 MG tablet Take 40 mg by mouth daily.    . prochlorperazine (COMPAZINE) 10 MG tablet Take 1 tablet (10 mg total) by mouth every 6 (six) hours as needed for nausea or vomiting. 30 tablet 2  . ranitidine (ZANTAC) 150 MG tablet Take 150 mg by mouth daily. 1-2 tabs daily    . silver sulfADIAZINE (SILVADENE) 1 % cream Apply 1 application topically 2 (two) times daily. to affected area    . vitamin B-12 (CYANOCOBALAMIN) 1000 MCG tablet Inject 1,000 mcg into the muscle every 30 (thirty) days.     No current facility-administered medications for this visit.   Facility-Administered Medications Ordered in Other Visits  Medication Dose Route Frequency Provider Last Rate Last Dose  . sodium chloride 0.9 % injection 10 mL  10 mL Intravenous PRN Lloyd Huger, MD      . sodium chloride 0.9 % injection  10 mL  10 mL Intracatheter PRN Lloyd Huger, MD   10 mL at 06/13/15 0919    OBJECTIVE: BP 105/66 mmHg  Pulse 80  Temp(Src) 96.5 F (35.8 C) (Tympanic)  Resp 18  Wt 196 lb 3.4 oz (89 kg)   Body mass index is 26.6 kg/(m^2).    ECOG FS:2 - Symptomatic, <50% confined to bed  General: Well-developed, well-nourished, no acute distress. Eyes: Pink conjunctiva, anicteric sclera. HEENT: Normocephalic, moist mucous membranes, clear oropharnyx. Lungs: Clear to auscultation bilaterally. Heart: Regular rate and rhythm. No rubs, murmurs, or gallops. Abdomen: Soft, slightly tender in right upper and lower quadrant, nondistended. No organomegaly noted, normoactive bowel sounds. Musculoskeletal: No edema, cyanosis, or clubbing. Neuro: Alert, answering all questions appropriately. Cranial nerves grossly intact. Skin: No rashes or petechiae noted. Psych: Normal affect.   LAB RESULTS:  Infusion on 06/13/2015  Component Date Value Ref Range Status  . WBC 06/13/2015 1.9* 3.8 - 10.6 K/uL Final  . RBC 06/13/2015 4.07* 4.40 - 5.90 MIL/uL Final  . Hemoglobin 06/13/2015 12.7*  13.0 - 18.0 g/dL Final  . HCT 06/13/2015 38.7* 40.0 - 52.0 % Final  . MCV 06/13/2015 95.2  80.0 - 100.0 fL Final  . MCH 06/13/2015 31.2  26.0 - 34.0 pg Final  . MCHC 06/13/2015 32.7  32.0 - 36.0 g/dL Final  . RDW 06/13/2015 16.0* 11.5 - 14.5 % Final  . Platelets 06/13/2015 176  150 - 440 K/uL Final  . Neutrophils Relative % 06/13/2015 45   Final  . Neutro Abs 06/13/2015 0.9* 1.4 - 6.5 K/uL Final  . Lymphocytes Relative 06/13/2015 24   Final  . Lymphs Abs 06/13/2015 0.5* 1.0 - 3.6 K/uL Final  . Monocytes Relative 06/13/2015 23   Final  . Monocytes Absolute 06/13/2015 0.4  0.2 - 1.0 K/uL Final  . Eosinophils Relative 06/13/2015 5   Final  . Eosinophils Absolute 06/13/2015 0.1  0 - 0.7 K/uL Final  . Basophils Relative 06/13/2015 3   Final  . Basophils Absolute 06/13/2015 0.1  0 - 0.1 K/uL Final  . Sodium 06/13/2015 131* 135 - 145 mmol/L Final  . Potassium 06/13/2015 4.2  3.5 - 5.1 mmol/L Final  . Chloride 06/13/2015 95* 101 - 111 mmol/L Final  . CO2 06/13/2015 30  22 - 32 mmol/L Final  . Glucose, Bld 06/13/2015 210* 65 - 99 mg/dL Final  . BUN 06/13/2015 17  6 - 20 mg/dL Final  . Creatinine, Ser 06/13/2015 1.36* 0.61 - 1.24 mg/dL Final  . Calcium 06/13/2015 7.7* 8.9 - 10.3 mg/dL Final  . Total Protein 06/13/2015 6.1* 6.5 - 8.1 g/dL Final  . Albumin 06/13/2015 3.3* 3.5 - 5.0 g/dL Final  . AST 06/13/2015 32  15 - 41 U/L Final  . ALT 06/13/2015 24  17 - 63 U/L Final  . Alkaline Phosphatase 06/13/2015 97  38 - 126 U/L Final  . Total Bilirubin 06/13/2015 0.7  0.3 - 1.2 mg/dL Final  . GFR calc non Af Amer 06/13/2015 45* >60 mL/min Final  . GFR calc Af Amer 06/13/2015 52* >60 mL/min Final   Comment: (NOTE) The eGFR has been calculated using the CKD EPI equation. This calculation has not been validated in all clinical situations. eGFR's persistently <60 mL/min signify possible Chronic Kidney Disease.   . Anion gap 06/13/2015 6  5 - 15 Final    STUDIES: No results found.  ASSESSMENT:   Stage III adenocarcinoma of the pancreas.  PLAN:   1. Pancreatic cancer. CA  19-9 previously elevated to 315. Patient was previously determined not to be a surgical candidate. He has received cycle 1 day 1 of gemcitabine with Abraxane on 05/29/2015. Subsequent cycles have been delayed due to thrombocytopenia. Today patient is neutropenic with an Starke of 900. We will therefore delay this treatment until next week.  He will continue with follow-up in 1 week with Dr. Grayland Ormond.  Patient expressed understanding and was in agreement with this plan. He also understands that He can call clinic at any time with any questions, concerns, or complaints.   Dr. Oliva Bustard was available for consultation and review of plan of care for this patient.  Pancreatic cancer   Staging form: Pancreas, AJCC 7th Edition     Clinical stage from 05/19/2015: Stage III (T4, N0, M0) - Signed by Lloyd Huger, MD on 05/19/2015   Evlyn Kanner, NP   06/13/2015 10:19 AM

## 2015-06-17 ENCOUNTER — Other Ambulatory Visit: Payer: Self-pay | Admitting: *Deleted

## 2015-06-17 DIAGNOSIS — C250 Malignant neoplasm of head of pancreas: Secondary | ICD-10-CM

## 2015-06-19 ENCOUNTER — Telehealth: Payer: Self-pay | Admitting: Family Medicine

## 2015-06-19 ENCOUNTER — Other Ambulatory Visit: Payer: Self-pay | Admitting: Family Medicine

## 2015-06-19 ENCOUNTER — Inpatient Hospital Stay: Payer: Medicare Other | Attending: Oncology

## 2015-06-19 ENCOUNTER — Inpatient Hospital Stay (HOSPITAL_BASED_OUTPATIENT_CLINIC_OR_DEPARTMENT_OTHER): Payer: Medicare Other | Admitting: Oncology

## 2015-06-19 VITALS — BP 108/66 | HR 88 | Temp 96.5°F | Resp 16 | Wt 196.4 lb

## 2015-06-19 DIAGNOSIS — I129 Hypertensive chronic kidney disease with stage 1 through stage 4 chronic kidney disease, or unspecified chronic kidney disease: Secondary | ICD-10-CM | POA: Diagnosis not present

## 2015-06-19 DIAGNOSIS — R944 Abnormal results of kidney function studies: Secondary | ICD-10-CM | POA: Diagnosis not present

## 2015-06-19 DIAGNOSIS — R5383 Other fatigue: Secondary | ICD-10-CM | POA: Diagnosis not present

## 2015-06-19 DIAGNOSIS — Z794 Long term (current) use of insulin: Secondary | ICD-10-CM

## 2015-06-19 DIAGNOSIS — I4891 Unspecified atrial fibrillation: Secondary | ICD-10-CM | POA: Insufficient documentation

## 2015-06-19 DIAGNOSIS — E039 Hypothyroidism, unspecified: Secondary | ICD-10-CM | POA: Diagnosis not present

## 2015-06-19 DIAGNOSIS — I499 Cardiac arrhythmia, unspecified: Secondary | ICD-10-CM | POA: Diagnosis not present

## 2015-06-19 DIAGNOSIS — C259 Malignant neoplasm of pancreas, unspecified: Secondary | ICD-10-CM | POA: Insufficient documentation

## 2015-06-19 DIAGNOSIS — Z950 Presence of cardiac pacemaker: Secondary | ICD-10-CM | POA: Diagnosis not present

## 2015-06-19 DIAGNOSIS — N189 Chronic kidney disease, unspecified: Secondary | ICD-10-CM | POA: Insufficient documentation

## 2015-06-19 DIAGNOSIS — Z85828 Personal history of other malignant neoplasm of skin: Secondary | ICD-10-CM

## 2015-06-19 DIAGNOSIS — I10 Essential (primary) hypertension: Secondary | ICD-10-CM | POA: Diagnosis not present

## 2015-06-19 DIAGNOSIS — E119 Type 2 diabetes mellitus without complications: Secondary | ICD-10-CM | POA: Insufficient documentation

## 2015-06-19 DIAGNOSIS — Z79899 Other long term (current) drug therapy: Secondary | ICD-10-CM | POA: Diagnosis not present

## 2015-06-19 DIAGNOSIS — I739 Peripheral vascular disease, unspecified: Secondary | ICD-10-CM | POA: Diagnosis not present

## 2015-06-19 DIAGNOSIS — Z8639 Personal history of other endocrine, nutritional and metabolic disease: Secondary | ICD-10-CM | POA: Insufficient documentation

## 2015-06-19 DIAGNOSIS — G629 Polyneuropathy, unspecified: Secondary | ICD-10-CM

## 2015-06-19 DIAGNOSIS — Z5111 Encounter for antineoplastic chemotherapy: Secondary | ICD-10-CM | POA: Insufficient documentation

## 2015-06-19 DIAGNOSIS — R531 Weakness: Secondary | ICD-10-CM | POA: Diagnosis not present

## 2015-06-19 DIAGNOSIS — I495 Sick sinus syndrome: Secondary | ICD-10-CM | POA: Diagnosis not present

## 2015-06-19 DIAGNOSIS — C252 Malignant neoplasm of tail of pancreas: Secondary | ICD-10-CM

## 2015-06-19 DIAGNOSIS — L03116 Cellulitis of left lower limb: Secondary | ICD-10-CM | POA: Diagnosis not present

## 2015-06-19 DIAGNOSIS — C250 Malignant neoplasm of head of pancreas: Secondary | ICD-10-CM

## 2015-06-19 LAB — CBC WITH DIFFERENTIAL/PLATELET
Basophils Absolute: 0 10*3/uL (ref 0–0.1)
Basophils Relative: 0 %
EOS PCT: 0 %
Eosinophils Absolute: 0 10*3/uL (ref 0–0.7)
HEMATOCRIT: 42 % (ref 40.0–52.0)
Hemoglobin: 13.6 g/dL (ref 13.0–18.0)
LYMPHS ABS: 0.7 10*3/uL — AB (ref 1.0–3.6)
LYMPHS PCT: 11 %
MCH: 31.2 pg (ref 26.0–34.0)
MCHC: 32.4 g/dL (ref 32.0–36.0)
MCV: 96.2 fL (ref 80.0–100.0)
MONO ABS: 0.7 10*3/uL (ref 0.2–1.0)
Monocytes Relative: 11 %
NEUTROS ABS: 4.8 10*3/uL (ref 1.4–6.5)
Neutrophils Relative %: 78 %
PLATELETS: 207 10*3/uL (ref 150–440)
RBC: 4.37 MIL/uL — AB (ref 4.40–5.90)
RDW: 17.2 % — ABNORMAL HIGH (ref 11.5–14.5)
WBC: 6.3 10*3/uL (ref 3.8–10.6)

## 2015-06-19 LAB — COMPREHENSIVE METABOLIC PANEL
ALT: 17 U/L (ref 17–63)
AST: 30 U/L (ref 15–41)
Albumin: 3.5 g/dL (ref 3.5–5.0)
Alkaline Phosphatase: 83 U/L (ref 38–126)
Anion gap: 7 (ref 5–15)
BILIRUBIN TOTAL: 0.8 mg/dL (ref 0.3–1.2)
BUN: 19 mg/dL (ref 6–20)
CALCIUM: 8 mg/dL — AB (ref 8.9–10.3)
CO2: 30 mmol/L (ref 22–32)
CREATININE: 1.47 mg/dL — AB (ref 0.61–1.24)
Chloride: 95 mmol/L — ABNORMAL LOW (ref 101–111)
GFR, EST AFRICAN AMERICAN: 47 mL/min — AB (ref >60)
GFR, EST NON AFRICAN AMERICAN: 41 mL/min — AB (ref >60)
Glucose, Bld: 156 mg/dL — ABNORMAL HIGH (ref 65–99)
Potassium: 4 mmol/L (ref 3.5–5.1)
Sodium: 132 mmol/L — ABNORMAL LOW (ref 135–145)
TOTAL PROTEIN: 6.6 g/dL (ref 6.5–8.1)

## 2015-06-19 NOTE — Progress Notes (Signed)
Patient reports an episode of epigastric pain last night that was 1-2/10 on pain scale and he took pain medication, first time had to take.

## 2015-06-19 NOTE — Telephone Encounter (Signed)
Patient needs the following medication refill: oxyCODONE-acetaminophen (PERCOCET/ROXICET) 5-325 MG per tablet.  CB# [phone]

## 2015-06-19 NOTE — Telephone Encounter (Signed)
Opened under wrong provider

## 2015-06-20 ENCOUNTER — Inpatient Hospital Stay: Payer: Medicare Other

## 2015-06-20 VITALS — BP 102/53 | HR 62 | Temp 96.9°F | Resp 18

## 2015-06-20 DIAGNOSIS — C252 Malignant neoplasm of tail of pancreas: Secondary | ICD-10-CM

## 2015-06-20 DIAGNOSIS — C259 Malignant neoplasm of pancreas, unspecified: Secondary | ICD-10-CM | POA: Diagnosis not present

## 2015-06-20 MED ORDER — PACLITAXEL PROTEIN-BOUND CHEMO INJECTION 100 MG
100.0000 mg/m2 | Freq: Once | INTRAVENOUS | Status: AC
  Administered 2015-06-20: 200 mg via INTRAVENOUS
  Filled 2015-06-20: qty 40

## 2015-06-20 MED ORDER — SODIUM CHLORIDE 0.9 % IV SOLN
1600.0000 mg | Freq: Once | INTRAVENOUS | Status: AC
  Administered 2015-06-20: 1600 mg via INTRAVENOUS
  Filled 2015-06-20: qty 42.08

## 2015-06-20 MED ORDER — OXYCODONE-ACETAMINOPHEN 5-325 MG PO TABS: 1.0000 | ORAL_TABLET | Freq: Three times a day (TID) | ORAL | Status: DC | PRN

## 2015-06-20 MED ORDER — HEPARIN SOD (PORK) LOCK FLUSH 100 UNIT/ML IV SOLN
500.0000 [IU] | Freq: Once | INTRAVENOUS | Status: AC | PRN
  Administered 2015-06-20: 500 [IU]
  Filled 2015-06-20: qty 5

## 2015-06-20 MED ORDER — SODIUM CHLORIDE 0.9 % IJ SOLN
10.0000 mL | INTRAMUSCULAR | Status: DC | PRN
  Administered 2015-06-20: 10 mL
  Filled 2015-06-20: qty 10

## 2015-06-20 MED ORDER — SODIUM CHLORIDE 0.9 % IV SOLN
Freq: Once | INTRAVENOUS | Status: AC
  Administered 2015-06-20: 09:00:00 via INTRAVENOUS
  Filled 2015-06-20: qty 4

## 2015-06-20 MED ORDER — SODIUM CHLORIDE 0.9 % IV SOLN
Freq: Once | INTRAVENOUS | Status: AC
  Administered 2015-06-20: 09:00:00 via INTRAVENOUS
  Filled 2015-06-20: qty 1000

## 2015-06-27 ENCOUNTER — Encounter: Payer: Self-pay | Admitting: Family Medicine

## 2015-06-27 ENCOUNTER — Observation Stay
Admission: AD | Admit: 2015-06-27 | Discharge: 2015-06-30 | Disposition: A | Discharge status: Home / Self Care | Payer: Medicare Other | Source: Ambulatory Visit | Attending: Internal Medicine | Admitting: Internal Medicine

## 2015-06-27 ENCOUNTER — Ambulatory Visit (INDEPENDENT_AMBULATORY_CARE_PROVIDER_SITE_OTHER): Payer: Medicare Other | Admitting: Family Medicine

## 2015-06-27 ENCOUNTER — Ambulatory Visit: Payer: Self-pay | Admitting: Family Medicine

## 2015-06-27 ENCOUNTER — Encounter: Payer: Self-pay | Admitting: Internal Medicine

## 2015-06-27 ENCOUNTER — Encounter: Payer: Medicare Other | Attending: Surgery | Admitting: Surgery

## 2015-06-27 ENCOUNTER — Observation Stay: Payer: Medicare Other

## 2015-06-27 VITALS — BP 130/52 | HR 83 | Temp 98.8°F | Resp 16 | Wt 196.0 lb

## 2015-06-27 DIAGNOSIS — I771 Stricture of artery: Secondary | ICD-10-CM | POA: Diagnosis not present

## 2015-06-27 DIAGNOSIS — Z950 Presence of cardiac pacemaker: Secondary | ICD-10-CM | POA: Diagnosis not present

## 2015-06-27 DIAGNOSIS — I1310 Hypertensive heart and chronic kidney disease without heart failure, with stage 1 through stage 4 chronic kidney disease, or unspecified chronic kidney disease: Secondary | ICD-10-CM | POA: Diagnosis not present

## 2015-06-27 DIAGNOSIS — M79604 Pain in right leg: Secondary | ICD-10-CM | POA: Insufficient documentation

## 2015-06-27 DIAGNOSIS — E1122 Type 2 diabetes mellitus with diabetic chronic kidney disease: Secondary | ICD-10-CM | POA: Diagnosis not present

## 2015-06-27 DIAGNOSIS — K219 Gastro-esophageal reflux disease without esophagitis: Secondary | ICD-10-CM | POA: Diagnosis not present

## 2015-06-27 DIAGNOSIS — E039 Hypothyroidism, unspecified: Secondary | ICD-10-CM | POA: Insufficient documentation

## 2015-06-27 DIAGNOSIS — M79605 Pain in left leg: Secondary | ICD-10-CM | POA: Insufficient documentation

## 2015-06-27 DIAGNOSIS — Z794 Long term (current) use of insulin: Secondary | ICD-10-CM | POA: Diagnosis not present

## 2015-06-27 DIAGNOSIS — I70632 Atherosclerosis of nonbiological bypass graft(s) of the right leg with ulceration of calf: Secondary | ICD-10-CM | POA: Insufficient documentation

## 2015-06-27 DIAGNOSIS — Z888 Allergy status to other drugs, medicaments and biological substances status: Secondary | ICD-10-CM | POA: Insufficient documentation

## 2015-06-27 DIAGNOSIS — L02419 Cutaneous abscess of limb, unspecified: Secondary | ICD-10-CM

## 2015-06-27 DIAGNOSIS — R609 Edema, unspecified: Secondary | ICD-10-CM

## 2015-06-27 DIAGNOSIS — Z85828 Personal history of other malignant neoplasm of skin: Secondary | ICD-10-CM | POA: Diagnosis not present

## 2015-06-27 DIAGNOSIS — E1142 Type 2 diabetes mellitus with diabetic polyneuropathy: Secondary | ICD-10-CM | POA: Insufficient documentation

## 2015-06-27 DIAGNOSIS — I482 Chronic atrial fibrillation: Secondary | ICD-10-CM | POA: Insufficient documentation

## 2015-06-27 DIAGNOSIS — C259 Malignant neoplasm of pancreas, unspecified: Secondary | ICD-10-CM | POA: Insufficient documentation

## 2015-06-27 DIAGNOSIS — L03119 Cellulitis of unspecified part of limb: Secondary | ICD-10-CM

## 2015-06-27 DIAGNOSIS — I495 Sick sinus syndrome: Secondary | ICD-10-CM | POA: Insufficient documentation

## 2015-06-27 DIAGNOSIS — E1140 Type 2 diabetes mellitus with diabetic neuropathy, unspecified: Secondary | ICD-10-CM | POA: Insufficient documentation

## 2015-06-27 DIAGNOSIS — M109 Gout, unspecified: Secondary | ICD-10-CM | POA: Diagnosis not present

## 2015-06-27 DIAGNOSIS — N189 Chronic kidney disease, unspecified: Secondary | ICD-10-CM | POA: Insufficient documentation

## 2015-06-27 DIAGNOSIS — Z91048 Other nonmedicinal substance allergy status: Secondary | ICD-10-CM | POA: Diagnosis not present

## 2015-06-27 DIAGNOSIS — M7989 Other specified soft tissue disorders: Secondary | ICD-10-CM | POA: Diagnosis not present

## 2015-06-27 DIAGNOSIS — I739 Peripheral vascular disease, unspecified: Secondary | ICD-10-CM | POA: Insufficient documentation

## 2015-06-27 DIAGNOSIS — L02619 Cutaneous abscess of unspecified foot: Secondary | ICD-10-CM | POA: Diagnosis not present

## 2015-06-27 DIAGNOSIS — N183 Chronic kidney disease, stage 3 (moderate): Secondary | ICD-10-CM | POA: Insufficient documentation

## 2015-06-27 DIAGNOSIS — E785 Hyperlipidemia, unspecified: Secondary | ICD-10-CM | POA: Insufficient documentation

## 2015-06-27 DIAGNOSIS — Z880 Allergy status to penicillin: Secondary | ICD-10-CM | POA: Diagnosis not present

## 2015-06-27 DIAGNOSIS — L03116 Cellulitis of left lower limb: Secondary | ICD-10-CM | POA: Diagnosis not present

## 2015-06-27 DIAGNOSIS — Z79899 Other long term (current) drug therapy: Secondary | ICD-10-CM | POA: Diagnosis not present

## 2015-06-27 DIAGNOSIS — L97211 Non-pressure chronic ulcer of right calf limited to breakdown of skin: Secondary | ICD-10-CM | POA: Insufficient documentation

## 2015-06-27 DIAGNOSIS — R109 Unspecified abdominal pain: Secondary | ICD-10-CM | POA: Insufficient documentation

## 2015-06-27 DIAGNOSIS — E1143 Type 2 diabetes mellitus with diabetic autonomic (poly)neuropathy: Secondary | ICD-10-CM | POA: Insufficient documentation

## 2015-06-27 DIAGNOSIS — R600 Localized edema: Secondary | ICD-10-CM | POA: Insufficient documentation

## 2015-06-27 DIAGNOSIS — L0390 Cellulitis, unspecified: Secondary | ICD-10-CM | POA: Diagnosis present

## 2015-06-27 DIAGNOSIS — E11622 Type 2 diabetes mellitus with other skin ulcer: Secondary | ICD-10-CM | POA: Insufficient documentation

## 2015-06-27 HISTORY — DX: Presence of cardiac pacemaker: Z95.0

## 2015-06-27 LAB — COMPREHENSIVE METABOLIC PANEL
ALBUMIN: 3 g/dL — AB (ref 3.5–5.0)
ALK PHOS: 115 U/L (ref 38–126)
ALT: 22 U/L (ref 17–63)
AST: 30 U/L (ref 15–41)
Anion gap: 9 (ref 5–15)
BUN: 21 mg/dL — AB (ref 6–20)
CALCIUM: 8.5 mg/dL — AB (ref 8.9–10.3)
CO2: 32 mmol/L (ref 22–32)
CREATININE: 1.43 mg/dL — AB (ref 0.61–1.24)
Chloride: 94 mmol/L — ABNORMAL LOW (ref 101–111)
GFR calc non Af Amer: 42 mL/min — ABNORMAL LOW (ref >60)
GFR, EST AFRICAN AMERICAN: 49 mL/min — AB (ref >60)
GLUCOSE: 180 mg/dL — AB (ref 65–99)
Potassium: 4 mmol/L (ref 3.5–5.1)
SODIUM: 135 mmol/L (ref 135–145)
Total Bilirubin: 1.4 mg/dL — ABNORMAL HIGH (ref 0.3–1.2)
Total Protein: 6.2 g/dL — ABNORMAL LOW (ref 6.5–8.1)

## 2015-06-27 LAB — CBC
HCT: 38.3 % — ABNORMAL LOW (ref 40.0–52.0)
Hemoglobin: 12.5 g/dL — ABNORMAL LOW (ref 13.0–18.0)
MCH: 31.4 pg (ref 26.0–34.0)
MCHC: 32.6 g/dL (ref 32.0–36.0)
MCV: 96.3 fL (ref 80.0–100.0)
PLATELETS: 39 10*3/uL — AB (ref 150–440)
RBC: 3.98 MIL/uL — ABNORMAL LOW (ref 4.40–5.90)
RDW: 16.8 % — AB (ref 11.5–14.5)
WBC: 4.1 10*3/uL (ref 3.8–10.6)

## 2015-06-27 LAB — GLUCOSE, CAPILLARY
Glucose-Capillary: 162 mg/dL — ABNORMAL HIGH (ref 65–99)
Glucose-Capillary: 155 mg/dL — ABNORMAL HIGH (ref 65–99)

## 2015-06-27 MED ORDER — INSULIN ASPART 100 UNIT/ML ~~LOC~~ SOLN
0.0000 [IU] | Freq: Every day | SUBCUTANEOUS | Status: DC
  Administered 2015-06-29: 4 [IU] via SUBCUTANEOUS
  Filled 2015-06-27: qty 4

## 2015-06-27 MED ORDER — INSULIN ASPART 100 UNIT/ML ~~LOC~~ SOLN
0.0000 [IU] | Freq: Three times a day (TID) | SUBCUTANEOUS | Status: DC
  Administered 2015-06-27: 2 [IU] via SUBCUTANEOUS
  Administered 2015-06-28: 3 [IU] via SUBCUTANEOUS
  Administered 2015-06-28: 17:00:00 2 [IU] via SUBCUTANEOUS
  Administered 2015-06-29: 3 [IU] via SUBCUTANEOUS
  Administered 2015-06-29: 2 [IU] via SUBCUTANEOUS
  Administered 2015-06-29: 12:00:00 3 [IU] via SUBCUTANEOUS
  Administered 2015-06-30: 2 [IU] via SUBCUTANEOUS
  Filled 2015-06-27 (×5): qty 2
  Filled 2015-06-27 (×2): qty 3

## 2015-06-27 MED ORDER — BUMETANIDE 1 MG PO TABS
2.0000 mg | ORAL_TABLET | Freq: Every day | ORAL | Status: DC
  Administered 2015-06-28 – 2015-06-30 (×3): 2 mg via ORAL
  Filled 2015-06-27: qty 1
  Filled 2015-06-27: qty 2
  Filled 2015-06-27 (×4): qty 1

## 2015-06-27 MED ORDER — PROCHLORPERAZINE MALEATE 10 MG PO TABS: 10.0000 mg | ORAL_TABLET | Freq: Four times a day (QID) | ORAL | Status: DC | PRN

## 2015-06-27 MED ORDER — CLOPIDOGREL BISULFATE 75 MG PO TABS
75.0000 mg | ORAL_TABLET | Freq: Every day | ORAL | Status: DC
  Administered 2015-06-28 – 2015-06-30 (×3): 75 mg via ORAL
  Filled 2015-06-27 (×3): qty 1

## 2015-06-27 MED ORDER — LISINOPRIL 5 MG PO TABS
5.0000 mg | ORAL_TABLET | Freq: Every day | ORAL | Status: DC
  Administered 2015-06-28 – 2015-06-30 (×3): 5 mg via ORAL
  Filled 2015-06-27 (×3): qty 1

## 2015-06-27 MED ORDER — PENTAFLUOROPROP-TETRAFLUOROETH EX AERO
INHALATION_SPRAY | CUTANEOUS | Status: DC | PRN
  Filled 2015-06-27: qty 103.5

## 2015-06-27 MED ORDER — LEVOTHYROXINE SODIUM 112 MCG PO TABS
112.0000 ug | ORAL_TABLET | Freq: Every day | ORAL | Status: DC
  Administered 2015-06-28 – 2015-06-30 (×3): 112 ug via ORAL
  Filled 2015-06-27 (×4): qty 1

## 2015-06-27 MED ORDER — VANCOMYCIN HCL IN DEXTROSE 1-5 GM/200ML-% IV SOLN: 1000.0000 mg | Freq: Once | INTRAVENOUS | Status: DC

## 2015-06-27 MED ORDER — HEPARIN SODIUM (PORCINE) 5000 UNIT/ML IJ SOLN: 5000.0000 [IU] | Freq: Three times a day (TID) | INTRAMUSCULAR | Status: DC

## 2015-06-27 MED ORDER — MONTELUKAST SODIUM 10 MG PO TABS
10.0000 mg | ORAL_TABLET | Freq: Every day | ORAL | Status: DC
  Administered 2015-06-27 – 2015-06-29 (×3): 10 mg via ORAL
  Filled 2015-06-27 (×3): qty 1

## 2015-06-27 MED ORDER — FAMOTIDINE 20 MG PO TABS
20.0000 mg | ORAL_TABLET | Freq: Every day | ORAL | Status: DC
  Administered 2015-06-28 – 2015-06-30 (×3): 20 mg via ORAL
  Filled 2015-06-27 (×3): qty 1

## 2015-06-27 MED ORDER — OXYCODONE-ACETAMINOPHEN 5-325 MG PO TABS
1.0000 | ORAL_TABLET | Freq: Three times a day (TID) | ORAL | Status: DC | PRN
  Administered 2015-06-27 – 2015-06-28 (×3): 1 via ORAL
  Filled 2015-06-27 (×6): qty 1

## 2015-06-27 MED ORDER — PRAVASTATIN SODIUM 20 MG PO TABS
40.0000 mg | ORAL_TABLET | Freq: Every day | ORAL | Status: DC
  Administered 2015-06-27 – 2015-06-30 (×4): 40 mg via ORAL
  Filled 2015-06-27 (×4): qty 2

## 2015-06-27 MED ORDER — ALLOPURINOL 100 MG PO TABS
200.0000 mg | ORAL_TABLET | Freq: Every morning | ORAL | Status: DC
  Administered 2015-06-28 – 2015-06-30 (×3): 200 mg via ORAL
  Filled 2015-06-27 (×3): qty 2

## 2015-06-27 MED ORDER — GABAPENTIN 300 MG PO CAPS
900.0000 mg | ORAL_CAPSULE | Freq: Every day | ORAL | Status: DC
  Administered 2015-06-28 – 2015-06-29 (×3): 900 mg via ORAL
  Filled 2015-06-27 (×3): qty 3

## 2015-06-27 MED ORDER — VANCOMYCIN HCL 10 G IV SOLR
1500.0000 mg | Freq: Once | INTRAVENOUS | Status: AC
  Administered 2015-06-27: 1500 mg via INTRAVENOUS
  Filled 2015-06-27: qty 1500

## 2015-06-27 MED ORDER — VANCOMYCIN HCL IN DEXTROSE 1-5 GM/200ML-% IV SOLN
1000.0000 mg | INTRAVENOUS | Status: DC
  Administered 2015-06-28 – 2015-06-30 (×3): 1000 mg via INTRAVENOUS
  Filled 2015-06-27 (×3): qty 200

## 2015-06-27 NOTE — Plan of Care (Signed)
Problem: Discharge Progression Outcomes Goal: Other Discharge Outcomes/Goals Outcome: Progressing Pt lives at home by himself, he developed a wound to his LLE which has developed into cellulitis. Pt is a cancer pt who is followed by Dr.Finnegan, last chemo treatment was 2 weeks ago.  Goals: Keep LLE elevated on pillow, pt verbalizes understanding. He may need a vascular consult because of ongoing neuropathy. Pt is a self with minimal assistance because of leg. Keep pain <5 Assess labs and vitals for a increase in fever indicating worsening infection Update family as plan of care changes

## 2015-06-27 NOTE — Progress Notes (Signed)
[name], [name] ([id number]) Visit Report for 06/27/2015 Abuse/Suicide Risk Screen Details Patient Name: [name], Bridges. Date of Service: 06/27/2015 8:45 AM Medical Record Number: [id number] Patient Account Number: [id number] Date of Birth/Sex: [birth year] (81 y.o. Male) Treating RN: Baruch Gouty, RN, BSN, Velva Harman Primary Care Physician: Lelon Huh Other Clinician: Referring Physician: Lelon Huh Treating Physician/Extender: Frann Rider in Treatment: 0 Abuse/Suicide Risk Screen Items Answer ABUSE/SUICIDE RISK SCREEN: Has anyone close to you tried to hurt or harm you recentlyo No Do you feel uncomfortable with anyone in your familyo No Has anyone forced you do things that you didnot want to doo No Do you have any thoughts of harming yourselfo No Patient displays signs or symptoms of abuse and/or neglect. No Electronic Signature(s) Signed: 06/27/2015 8:57:26 AM By: Regan Lemming BSN, RN Entered By: Regan Lemming on 06/27/2015 08:57:26 [name], [name] ([id number]) -------------------------------------------------------------------------------- Activities of Daily Living Details Patient Name: [name], [name] Date of Service: 06/27/2015 8:45 AM Medical Record Number: [id number] Patient Account Number: [id number] Date of Birth/Sex: [birth year] (81 y.o. Male) Treating RN: Baruch Gouty, RN, BSN, Velva Harman Primary Care Physician: Lelon Huh Other Clinician: Referring Physician: Lelon Huh Treating Physician/Extender: Frann Rider in Treatment: 0 Activities of Daily Living Items Answer Activities of Daily Living (Please select one for each item) Drive Automobile Need Assistance Take Medications Completely Able Use Telephone Completely Able Care for Appearance Completely Able Use Toilet Completely Able Bath / Shower Completely Able Dress Self Completely Able Feed Self Completely Able Walk Need Assistance Get In / Out Bed Completely [place] Need Assistance Shop for Self Need Assistance Electronic Signature(s) Signed: 06/27/2015 8:59:22 AM By: Regan Lemming BSN, RN Entered By: Regan Lemming on 06/27/2015 08:59:22 [name], [name] ([id number]) -------------------------------------------------------------------------------- Education Assessment Details Patient Name: [name]. Date of Service: 06/27/2015 8:45 AM Medical Record Number: [id number] Patient Account Number: [id number] Date of Birth/Sex: [birth year] (81 y.o. Male) Treating RN: Baruch Gouty, RN, BSN, Velva Harman Primary Care Physician: Lelon Huh Other Clinician: Referring Physician: Lelon Huh Treating Physician/Extender: Frann Rider in Treatment: 0 Primary Learner Assessed: Patient Learning Preferences/Education Level/Primary Language Learning Preference: Explanation Highest Education Level: High School Preferred Language: English Cognitive Barrier Assessment/Beliefs Language Barrier: No Physical Barrier Assessment Impaired Vision: Yes Glasses Impaired Hearing: No Decreased Hand dexterity: No Knowledge/Comprehension Assessment Knowledge Level: High Comprehension Level: High Ability to understand written High instructions: Ability to understand verbal High instructions: Motivation Assessment Anxiety Level: Calm Cooperation: Cooperative Education Importance: Acknowledges Need Interest in Health Problems: Asks Questions Perception: Coherent Willingness to Engage in Self- High Management Activities: Readiness to Engage in Self- High Management Activities: Electronic Signature(s) Signed: 06/27/2015 8:58:51 AM By: Regan Lemming BSN, RN Entered By: Regan Lemming on 06/27/2015 08:58:51 [name], [name] ([id number]) -------------------------------------------------------------------------------- Fall Risk Assessment Details Patient Name: [name]. Date of Service: 06/27/2015 8:45 AM Medical Record  Number: [id number] Patient Account Number: [id number] Date of Birth/Sex: [birth year] (81 y.o. Male) Treating RN: Baruch Gouty, RN, BSN, Velva Harman Primary Care Physician: Lelon Huh Other Clinician: Referring Physician: Lelon Huh Treating Physician/Extender: Frann Rider in Treatment: 0 Fall Risk Assessment Items FALL RISK ASSESSMENT: History of falling - immediate or within 3 months 0 No Secondary diagnosis 0 No Ambulatory aid None/bed rest/wheelchair/nurse 0 No Crutches/cane/walker 15 Yes Furniture 0 No IV Access/Saline Lock 0 No Gait/Training Normal/bed rest/immobile 0 No Weak 10 Yes Impaired 20 Yes Mental Status Oriented to own ability 0 Yes Electronic Signature(s) Signed: 06/27/2015 8:58:08 AM By: Regan Lemming BSN,  RN Entered By: Regan Lemming on 06/27/2015 08:58:08 [name] ([id number]) -------------------------------------------------------------------------------- Foot Assessment Details Patient Name: [name], SELLEY. Date of Service: 06/27/2015 8:45 AM Medical Record Number: [id number] Patient Account Number: [id number] Date of Birth/Sex: 1925-01-15 (81 y.o. Male) Treating RN: Baruch Gouty, RN, BSN, Velva Harman Primary Care Physician: Lelon Huh Other Clinician: Referring Physician: Lelon Huh Treating Physician/Extender: Frann Rider in Treatment: 0 Foot Assessment Items Site Locations + = Sensation present, - = Sensation absent, C = Callus, U = Ulcer R = Redness, W = Warmth, M = Maceration, PU = Pre-ulcerative lesion F = Fissure, S = Swelling, D = Dryness Assessment Right: Left: Other Deformity: No No Prior Foot Ulcer: No No Prior Amputation: No No Charcot Joint: No No Ambulatory Status: Ambulatory With Help Assistance Device: Cane Gait: Administrator, arts) Signed: 06/27/2015 8:57:54 AM By: Regan Lemming BSN, RN Entered By: Regan Lemming on 06/27/2015 08:57:54 [name], [name]  ([id number]) -------------------------------------------------------------------------------- Nutrition Risk Assessment Details Patient Name: [name]. Date of Service: 06/27/2015 8:45 AM Medical Record Number: [id number] Patient Account Number: [id number] Date of Birth/Sex: April 17, 1925 (81 y.o. Male) Treating RN: Baruch Gouty, RN, BSN, [place] Primary Care Physician: Lelon Huh Other Clinician: Referring Physician: Lelon Huh Treating Physician/Extender: Frann Rider in Treatment: 0 Height (in): 72 Weight (lbs): 202.1 Body Mass Index (BMI): 27.4 Nutrition Risk Assessment Items NUTRITION RISK SCREEN: I have an illness or condition that made me change the kind and/or 0 No amount of food I eat I eat fewer than two meals per day 0 No I eat few fruits and vegetables, or milk products 0 No I have three or more drinks of beer, liquor or wine almost every day 0 No I have tooth or mouth problems that make it hard for me to eat 0 No I don't always have enough money to buy the food I need 0 No I eat alone most of the time 0 No I take three or more different prescribed or over-the-counter drugs a 0 No day Without wanting to, I have lost or gained 10 pounds in the last six 0 No months I am not always physically able to shop, cook and/or feed myself 0 No Nutrition Protocols Good Risk Protocol 0 No interventions needed Moderate Risk Protocol Electronic Signature(s) Signed: 06/27/2015 8:57:34 AM By: Regan Lemming BSN, RN Entered By: Regan Lemming on 06/27/2015 08:57:34

## 2015-06-27 NOTE — Consult Note (Signed)
WOC wound consult note Reason for Consult:Cellulitis to left anterior pretibial region.  Skin tear from an injury this week.  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  Wound type:Infectious Pressure Ulcer POA: N/A Measurement:Left leg 3 cm x 2.2cm wound bed is 100% thin adherent slough Wound [id number] Drainage (amount, consistency, odor) Minimal serosanguinous drainage.  No odor Periwound:Intense erythema from knee to toes on left leg.  Patient unable to bear weight on this leg.  Dressing procedure/placement/frequency:Cleanse lesion to left anterior leg with NS and pat gently dry.  Will leave silicone border foam dressing in place and reassess Monday.  Will begin moist wound therapy at this time if autolysis has occurred.  If not, will consider enzymatic debrider, such as Santyl.  Necrotic tip left great toe is stable.  Will leave open to air. No drainage or erythema at this time.  Will not follow at this time.  Please re-consult if needed.  Domenic Moras RN BSN [facility] Pager [phone]

## 2015-06-27 NOTE — Progress Notes (Signed)
Patient: [name] Male    DOB: 05-Dec-1924   81 y.o.   MRN: [id number] Visit Date: 06/27/2015  Today's Provider: Lelon Huh, MD   Chief Complaint  Patient presents with  . Wound Check   Subjective:    HPI  Patient in office to for physician to look at wound on left leg. He states he scraped the front of his leg a few weeks ago and it was not healing so he presented to the wound care center this morning. Wound was cleaned and dressed, but was lower leg was noted to be bright red and swollen so was sent here for additional evaluation. Patient states leg is sore and hurts a little to walk on, but he is able to bear weight without difficulty. Has had no fevers or chills. No chest pains or shortness of breath. Redness has encompassed entire foot for a few days, and slowing ascending lower leg ever several days.  He does have pancreatic cancer on chemotherapy and has been leukopenic with WBC=1.9 on 9/20, but up to 6.3 on 10/6  Lab Results  Component Value Date   HGBA1C 8.1* 03/15/2015     Patient Active Problem List   Diagnosis Date Noted  . Pancreatic cancer ([place]) 05/19/2015  . Abdominal pain 05/08/2015  . Status post cardiac pacemaker procedure 04/30/2015  . Bradycardia 04/10/2015  . Dizziness 04/10/2015  . Arthritis 04/09/2015  . Bilateral carotid artery disease ([place]) 04/09/2015  . Chronic kidney disease (CKD), stage III (moderate) 04/09/2015  . Edema 04/09/2015  . History of pancreatitis 04/09/2015  . Irritable colon 04/09/2015  . Onychomycosis 04/09/2015  . Skin ulcer ([place]) 04/09/2015  . Testicular mass 04/09/2015  . Pancreatic mass 03/15/2015  . Neuropathy ([place]) 02/17/2010  . LBP (low back pain) 05/20/2009  . Gouty arthropathy 01/01/2009  . Adult hypothyroidism 01/01/2009  . Pernicious anemia 12/27/2008  . B-complex deficiency 12/27/2008  . Congestive heart failure ([place]) 12/27/2008  . Esophageal reflux 12/27/2008  . Apnea, sleep 12/27/2008  . Diabetes  mellitus with neurological manifestations, uncontrolled ([place]) 09/13/2008  . Atrial fibrillation ([place]) 09/14/2007  . Peripheral vascular disease ([place]) 09/14/2007  . Polycythemia, secondary 09/14/2007  . Essential (primary) hypertension 01/31/2007  . Hypercholesteremia 01/31/2007    Allergies  Allergen Reactions  . Adhesive [Tape] Other (See Comments)    Patient states that adhesive tape causes his skin to tear very easily and asks for paper tape ONLY  . Crestor [Rosuvastatin Calcium] Rash  . Penicillins Rash   Previous Medications   ALLOPURINOL (ZYLOPRIM) 100 MG TABLET    TAKE 2 TABLETS BY MOUTH EVERY MORNING   BUMETANIDE (BUMEX) 2 MG TABLET    Take 2 mg by mouth daily. 1 or 2 tablets AS NEEDED   CLOPIDOGREL (PLAVIX) 75 MG TABLET       GABAPENTIN (NEURONTIN) 300 MG CAPSULE    Take 3 capsules (900 mg total) by mouth at bedtime.   GARLIC 6967 MG CAPS    Take by mouth daily.   GLIPIZIDE (GLUCOTROL) 5 MG TABLET    Take by mouth 2 (two) times daily before a meal.   INSULIN GLARGINE (TOUJEO SOLOSTAR) 300 UNIT/ML SOPN    Inject 10 Units into the skin daily.   LEVOTHYROXINE (SYNTHROID, LEVOTHROID) 112 MCG TABLET    Take 112 mcg by mouth daily before breakfast.   LIDOCAINE-PRILOCAINE (EMLA) CREAM    Apply 1 application topically as needed. Apply to port then cover with saran wrap 1-2  hours before chemotherapy appointment   LISINOPRIL (PRINIVIL,ZESTRIL) 10 MG TABLET    Take 5 mg by mouth daily.    MONTELUKAST (SINGULAIR) 10 MG TABLET    Take 10 mg by mouth at bedtime.   OMEGA-3 FATTY ACIDS (FISH OIL) 1000 MG CAPS    Take by mouth 2 (two) times daily.   OXYCODONE-ACETAMINOPHEN (PERCOCET/ROXICET) 5-325 MG TABLET    Take 1 tablet by mouth every 8 (eight) hours as needed for moderate pain.   PRAVASTATIN (PRAVACHOL) 40 MG TABLET    Take 40 mg by mouth daily.   PROCHLORPERAZINE (COMPAZINE) 10 MG TABLET    Take 1 tablet (10 mg total) by mouth every 6 (six) hours as needed for nausea or vomiting.    RANITIDINE (ZANTAC) 150 MG TABLET    Take 150 mg by mouth daily. 1-2 tabs daily   SILVER SULFADIAZINE (SILVADENE) 1 % CREAM    Apply 1 application topically 2 (two) times daily. to affected area   VITAMIN B-12 (CYANOCOBALAMIN) 1000 MCG TABLET    Inject 1,000 mcg into the muscle every 30 (thirty) days.    Review of Systems  Cardiovascular: Positive for leg swelling. Negative for chest pain and palpitations.       Left foot  Gastrointestinal: Positive for abdominal pain.       Has pain in stomach x3 days  Neurological: Negative for dizziness, light-headedness and headaches.    Social History  Substance Use Topics  . Smoking status: Never Smoker   . Smokeless tobacco: Never Used  . Alcohol Use: No   Objective:   BP 130/52 mmHg  Pulse 83  Temp(Src) 98.8 F (37.1 C) (Oral)  Resp 16  Wt 196 lb (88.905 kg)  SpO2 95%  Physical Exam  Dressing in place mid left anterior lower leg from wound care clinic . Erythema extending from a few cm above dressing down to encompass entire foot, around lateral lower leg with some blistering. Diffusely tender anterior leg. No red streaks. 2+ Left lower leg edema.     Assessment & Plan:     1. Cellulitis and abscess of foot Severe cellulitis in patient with multiple comorbidity compromising immune response. High risk for limb loss if not treated aggressively. Patient to be directly admitted to hospitalist service for IV antibiotic therapy and close monitoring.   2. Cellulitis and abscess of leg        Lelon Huh, MD  [facility]

## 2015-06-27 NOTE — Progress Notes (Signed)
SHOAIB, SIEFKER ([id number]) Visit Report for 06/27/2015 Chief Complaint Document Details Patient Name: [name], [name]. Date of Service: 06/27/2015 8:45 AM Medical Record Number: [id number] Patient Account Number: [id number] Date of Birth/Sex: [birth year] (81 y.o. Male) Treating RN: Baruch Gouty, RN, BSN, Velva Harman Primary Care Physician: Lelon Huh Other Clinician: Referring Physician: Lelon Huh Treating Physician/Extender: Frann Rider in Treatment: 0 Information Obtained from: Patient Chief Complaint Patients presents for treatment of an open diabetic ulcer. 81 year old gentleman who has had a ulcerated lesion on his left lower extremity for 2 weeks now has developed significant redness and swelling for the last 3 days. Electronic Signature(s) Signed: 06/27/2015 9:43:40 AM By: Christin Fudge MD, FACS Entered By: Christin Fudge on 06/27/2015 09:43:40 Stilley, [name] ([id number]) -------------------------------------------------------------------------------- HPI Details Patient Name: [name]. Date of Service: 06/27/2015 8:45 AM Medical Record Number: [id number] Patient Account Number: [id number] Date of Birth/Sex: October 14, 1924 (81 y.o. Male) Treating RN: Baruch Gouty, RN, BSN, Velva Harman Primary Care Physician: Lelon Huh Other Clinician: Referring Physician: Lelon Huh Treating Physician/Extender: Frann Rider in Treatment: 0 History of Present Illness HPI Description: Pleasant 81 year old with superficial injury to the left lower extremity about 2 weeks ago and now has developed redness swelling and pain of the left lower extremity for about 3 days. of note the patient has recently been diagnosed with a pancreatic cancer and is on chemotherapy for this and has been treated by Dr. Quay Burow. Past medical history of diabetes (hemoglobin A1c 8.6 in April 2016), peripheral neuropathy, peripheral vascular disease (status post bilateral lower extremity stents per  patient), atrial fibrillation, and chronic kidney disease. Ambulating normally per his baseline. Seen in f/u by Dr Lucky Cowboy June 2016. R ABI 0.5. No vascular intervention recommended. Will consider arteriogram if the ulcer does not heal. he also had a Port-A-Cath placed recently on 06/04/2015. his white count 2 weeks ago was 1.9 and a week ago was 6.3 Addendum: I tried to speak to his medical oncologist Dr. Grayland Ormond who was unavailable. I spoke to his poor PCP Dr. Juanetta Beets, and discussed the fact that the patient has had a significant cellulitis and possible neutropenia and would need appropriate oral or IV antibiotics after a CBCs done. I was also concerned about the vascularity of his left lower extremity. Dr. Caryn Section agreed to see him in the office right away and would take it from there. Electronic Signature(s) Signed: 06/27/2015 10:10:05 AM By: Christin Fudge MD, FACS Previous Signature: 06/27/2015 9:47:11 AM Version By: Christin Fudge MD, FACS Entered By: Christin Fudge on 06/27/2015 10:10:04 [name], [name] ([id number]) -------------------------------------------------------------------------------- Physical Exam Details Patient Name: [name], [name] Date of Service: 06/27/2015 8:45 AM Medical Record Number: [id number] Patient Account Number: [id number] Date of Birth/Sex: 1924-12-11 (81 y.o. Male) Treating RN: Baruch Gouty, RN, BSN, Velva Harman Primary Care Physician: Lelon Huh Other Clinician: Referring Physician: Lelon Huh Treating Physician/Extender: Frann Rider in Treatment: 0 Constitutional . Pulse regular. Respirations normal and unlabored. Afebrile. . Eyes Nonicteric. Reactive to light. Ears, Nose, Mouth, and Throat Lips, teeth, and gums WNL.Marland Kitchen Moist mucosa without lesions . Neck supple and nontender. No palpable supraclavicular or cervical adenopathy. Normal sized without goiter. Respiratory WNL. No retractions.. Cardiovascular Pedal Pulses monophasic on Doppler  and ABI could not be measured.. he has got a significant cellulitis from the midcalf region right up to the tip of his toes. His left big toe has a dry eschar and has significant callus on the tip of the toe.. Gastrointestinal (GI) Abdomen without masses or tenderness.Marland Kitchen No  liver or spleen enlargement or tenderness.. Lymphatic No adneopathy. No adenopathy. No adenopathy. Musculoskeletal Adexa without tenderness or enlargement.. Digits and nails w/o clubbing, cyanosis, infection, petechiae, ischemia, or inflammatory conditions.. Integumentary (Hair, Skin) No suspicious lesions. No crepitus or fluctuance. No peri-wound warmth or erythema. No masses.Marland Kitchen Psychiatric Judgement and insight Intact.. No evidence of depression, anxiety, or agitation.. Notes the patient has a ulcer on the left lateral lower extremity due to a superficial abrasion and this has some eschar and some slough. He also has a significant dry eschar on the tip of his left big toe and this could be vascular in nature. His current significant cellulitis from his calf downwards. Electronic Signature(s) Signed: 06/27/2015 9:49:22 AM By: Christin Fudge MD, FACS Entered By: Christin Fudge on 06/27/2015 [place], [place] ([id number]) -------------------------------------------------------------------------------- Physician Orders Details Patient Name: [name]. Date of Service: 06/27/2015 8:45 AM Medical Record Number: [id number] Patient Account Number: [id number] Date of Birth/Sex: [birth year] (81 y.o. Male) Treating RN: Baruch Gouty, RN, BSN, Velva Harman Primary Care Physician: Lelon Huh Other Clinician: Referring Physician: Lelon Huh Treating Physician/Extender: Frann Rider in Treatment: 0 Verbal / Phone Orders: Yes Clinician: Afful, RN, BSN, Rita Read Back and Verified: Yes Diagnosis Coding Wound Cleansing Wound #3 Left,Lateral Lower Leg o Clean wound with Normal Saline. Anesthetic Wound #3  Left,Lateral Lower Leg o Topical Lidocaine 4% cream applied to wound bed prior to debridement Primary Wound Dressing Wound #3 Left,Lateral Lower Leg o Hydrogel Secondary Dressing Wound #3 Left,Lateral Lower Leg o Boardered Foam Dressing Dressing Change Frequency Wound #3 Left,Lateral Lower Leg o Change dressing every day. Follow-up Appointments Wound #3 Left,Lateral Lower Leg o Return Appointment in 1 week. Electronic Signature(s) Signed: 06/27/2015 9:24:20 AM By: Regan Lemming BSN, RN Signed: 06/27/2015 4:02:00 PM By: Christin Fudge MD, FACS Entered By: Regan Lemming on 06/27/2015 09:24:19 [name], [name] ([id number]) -------------------------------------------------------------------------------- Problem List Details Patient Name: CAVON, NICOLLS H. Date of Service: 06/27/2015 8:45 AM Medical Record Number: [id number] Patient Account Number: [id number] Date of Birth/Sex: 27-Oct-1924 (81 y.o. Male) Treating RN: Baruch Gouty, RN, BSN, Velva Harman Primary Care Physician: Lelon Huh Other Clinician: Referring Physician: Lelon Huh Treating Physician/Extender: Frann Rider in Treatment: 0 Active Problems ICD-10 Encounter Code Description Active Date Diagnosis E11.622 Type 2 diabetes mellitus with other skin ulcer 06/27/2015 Yes I70.632 Atherosclerosis of nonbiological bypass graft(s) of the 06/27/2015 Yes right leg with ulceration of calf L97.222 Non-pressure chronic ulcer of left calf with fat layer 06/27/2015 Yes exposed L03.116 Cellulitis of left lower limb 06/27/2015 Yes Inactive Problems Resolved Problems Electronic Signature(s) Signed: 06/27/2015 9:42:55 AM By: Christin Fudge MD, FACS Entered By: Christin Fudge on 06/27/2015 09:42:55 Shinsky, [name] ([id number]) -------------------------------------------------------------------------------- Progress Note/History and Physical Details Patient Name: [name]. Date of Service: 06/27/2015 8:45  AM Medical Record Number: [id number] Patient Account Number: [id number] Date of Birth/Sex: 1925-05-13 (81 y.o. Male) Treating RN: Baruch Gouty, RN, BSN, Velva Harman Primary Care Physician: Lelon Huh Other Clinician: Referring Physician: Lelon Huh Treating Physician/Extender: Frann Rider in Treatment: 0 Subjective Chief Complaint Information obtained from Patient Patients presents for treatment of an open diabetic ulcer. 81 year old gentleman who has had a ulcerated lesion on his left lower extremity for 2 weeks now has developed significant redness and swelling for the last 3 days. History of Present Illness (HPI) Pleasant 81 year old with superficial injury to the left lower extremity about 2 weeks ago and now has developed redness swelling and pain of the left lower extremity for about 3 days. of note the patient has  recently been diagnosed with a pancreatic cancer and is on chemotherapy for this and has been treated by Dr. Quay Burow. Past medical history of diabetes (hemoglobin A1c 8.6 in April 2016), peripheral neuropathy, peripheral vascular disease (status post bilateral lower extremity stents per patient), atrial fibrillation, and chronic kidney disease. Ambulating normally per his baseline. Seen in f/u by Dr Lucky Cowboy June 2016. R ABI 0.5. No vascular intervention recommended. Will consider arteriogram if the ulcer does not heal. he also had a Port-A-Cath placed recently on 06/04/2015. his white count 2 weeks ago was 1.9 and a week ago was 6.3 Addendum: I tried to speak to his medical oncologist Dr. Grayland Ormond who was unavailable. I spoke to his poor PCP Dr. Juanetta Beets, and discussed the fact that the patient has had a significant cellulitis and possible neutropenia and would need appropriate oral or IV antibiotics after a CBCs done. I was also concerned about the vascularity of his left lower extremity. Dr. Caryn Section agreed to see him in the office right away and would take it from  there. Wound History Patient presents with 1 open wound that has been present for approximately 1.5week. Patient has been treating wound in the following manner: prisma. Laboratory tests have not been performed in the last month. Patient reportedly has not tested positive for an antibiotic resistant organism. Patient reportedly has not tested positive for osteomyelitis. Patient reportedly has not had testing performed to evaluate circulation in the legs. Patient experiences the following problems associated with their wounds: infection, swelling. Patient History Information obtained from Patient. [name], [name] ([id number]) Allergies Crestor- Hyperlipidemics, Penicillins Family History Cancer - Siblings, Diabetes - Mother, Kidney Disease - Father, No family history of Heart Disease, Hereditary Spherocytosis, Hypertension, Lung Disease, Seizures, Stroke, Thyroid Problems. Social History Never smoker, Marital Status - Married, Alcohol Use - Never, Drug Use - No History, Caffeine Use - Daily. Medical History Eyes Patient has history of Cataracts - removed 09/2013 (B) Denies history of Glaucoma, Optic Neuritis Ear/Nose/Mouth/Throat Denies history of Chronic sinus problems/congestion, Middle ear problems Hematologic/Lymphatic Denies history of Anemia, Hemophilia, Human Immunodeficiency Virus, Lymphedema, Sickle Cell Disease Respiratory Denies history of Asthma, Chronic Obstructive Pulmonary Disease (COPD), Pneumothorax, Sleep Apnea, Tuberculosis Cardiovascular Patient has history of Arrhythmia - Afib, Congestive Heart Failure, Hypertension, Peripheral Arterial Disease Denies history of Angina, Coronary Artery Disease, Deep Vein Thrombosis, Hypotension, Myocardial Infarction, Peripheral Venous Disease, Phlebitis, Vasculitis Gastrointestinal Denies history of Cirrhosis , Colitis, Crohn s, Hepatitis A, Hepatitis B, Hepatitis C Endocrine Patient has history of Type II Diabetes - 01/10/15  A1C 8.8 Denies history of Type I Diabetes Genitourinary Denies history of End Stage Renal Disease Immunological Denies history of Lupus Erythematosus, Raynaud s, Scleroderma Integumentary (Skin) Denies history of History of Burn, History of pressure wounds Musculoskeletal Patient has history of Gout, Osteoarthritis Denies history of Rheumatoid Arthritis, Osteomyelitis Neurologic Patient has history of Neuropathy Denies history of Dementia, Quadriplegia, Paraplegia, Seizure Disorder Oncologic Patient has history of Received Chemotherapy - pancreatic cancer Denies history of Received Radiation Psychiatric Denies history of Anorexia/bulimia, Confinement Anxiety Patient is treated with Insulin. Blood sugar is tested. Blood sugar results noted at the following times: [name], [name] ([id number]) Breakfast - 133. Hospitalization/Surgery History - 09/13/1960, [facility], Appendectomy. - 09/14/2007, [facility], Angioplasty. - 09/13/2008, [facility], Angioplasty . - 12/13/2014, Skin CA excision . Medical And Surgical History Notes Constitutional Symptoms (General Health) HLD Hematologic/Lymphatic polycythemia; pernicious anemia Cardiovascular (B) carotid artery disease; angioplasty 2009/2010; carotid endarterectomy 08/15/2014; cardiac stent placed 08/14/2015 pacemaker Gastrointestinal IBS; GERD  Endocrine pancreatitis; appendectomy Genitourinary CKD stg III; testicular mass Musculoskeletal Lumbago Oncologic Skin CA- (B) ears/neck Review of Systems (ROS) Eyes Complains or has symptoms of Dry Eyes. Ear/Nose/Mouth/Throat The patient has no complaints or symptoms. Hematologic/Lymphatic Complains or has symptoms of Bleeding / Clotting Disorders. Respiratory The patient has no complaints or symptoms. Cardiovascular The patient has no complaints or symptoms. Gastrointestinal The patient has no complaints or symptoms. Genitourinary The patient has no complaints or symptoms. Immunological The patient  has no complaints or symptoms. Integumentary (Skin) Complains or has symptoms of Bleeding or bruising tendency. Neurologic The patient has no complaints or symptoms. Psychiatric The patient has no complaints or symptoms. [name], [name] ([id number]) Objective Constitutional Pulse regular. Respirations normal and unlabored. Afebrile. Vitals Time Taken: 9:08 AM, Height: 72 in, Source: Stated, Temperature: 98.4 F, Pulse: 57 bpm, Respiratory Rate: 18 breaths/min, Blood Pressure: 126/45 mmHg. Eyes Nonicteric. Reactive to light. Ears, Nose, Mouth, and Throat Lips, teeth, and gums WNL.Marland Kitchen Moist mucosa without lesions . Neck supple and nontender. No palpable supraclavicular or cervical adenopathy. Normal sized without goiter. Respiratory WNL. No retractions.. Cardiovascular Pedal Pulses monophasic on Doppler and ABI could not be measured.. he has got a significant cellulitis from the midcalf region right up to the tip of his toes. His left big toe has a dry eschar and has significant callus on the tip of the toe.. Gastrointestinal (GI) Abdomen without masses or tenderness.. No liver or spleen enlargement or tenderness.. Lymphatic No adneopathy. No adenopathy. No adenopathy. Musculoskeletal Adexa without tenderness or enlargement.. Digits and nails w/o clubbing, cyanosis, infection, petechiae, ischemia, or inflammatory conditions.Marland Kitchen Psychiatric Judgement and insight Intact.. No evidence of depression, anxiety, or agitation.. General Notes: the patient has a ulcer on the left lateral lower extremity due to a superficial abrasion and this has some eschar and some slough. He also has a significant dry eschar on the tip of his left big toe and this could be vascular in nature. His current significant cellulitis from his calf downwards. Integumentary (Hair, Skin) No suspicious lesions. No crepitus or fluctuance. No peri-wound warmth or erythema. No masses.Marland Kitchen [name], [name]  ([id number]) Wound #3 status is Open. Original cause of wound was Shear/Friction. The wound is located on the Left,Lateral Lower Leg. The wound measures 2.4cm length x 11.4cm width x 0.1cm depth; 21.488cm^2 area and 2.149cm^3 volume. The wound is limited to skin breakdown. There is no tunneling or undermining noted. There is a small amount of serous drainage noted. The wound margin is distinct with the outline attached to the wound base. There is small (1-33%) [name], pale granulation within the wound bed. There is a medium (34-66%) amount of necrotic tissue within the wound bed including Eschar and Adherent Slough. The periwound skin appearance exhibited: Localized Edema, Moist, Erythema. The periwound skin appearance did not exhibit: Callus, Crepitus, Excoriation, Fluctuance, Friable, Induration, Rash, Scarring, Dry/Scaly, Maceration, Atrophie Blanche, Cyanosis, Ecchymosis, Hemosiderin Staining, Mottled, Pallor, Rubor. The surrounding wound skin color is noted with erythema which is circumferential. Periwound temperature was noted as No Abnormality. The periwound has tenderness on palpation. Assessment Active Problems ICD-10 E11.622 - Type 2 diabetes mellitus with other skin ulcer I70.632 - Atherosclerosis of nonbiological bypass graft(s) of the right leg with ulceration of calf L97.222 - Non-pressure chronic ulcer of left calf with fat layer exposed L03.116 - Cellulitis of left lower limb This 81 year old gentleman who has diabetes mellitus, significant peripheral vascular disease and now has been on chemotherapy for pancreatic cancer has a lacerated wound to  the left lower extremity complicated with significant cellulitis. I'm concerned that his white count may have dropped down again and he may need IV antibiotics, local care and elevation of his limbs in hospital with appropriate workup for his peripheral vascular disease by Dr. Leotis Pain who is his vascular surgeon. I have put in a call  to his medical oncologist and will discuss the case with him and send him to the hospital for appropriate admission. Treatment plan has been discussed with the patient and his son who is at the bedside and we will await a call back. Plan Wound Cleansing: Wound #3 Left,Lateral Lower Leg: Clean wound with Normal Saline. [name], [name] ([id number]) Anesthetic: Wound #3 Left,Lateral Lower Leg: Topical Lidocaine 4% cream applied to wound bed prior to debridement Primary Wound Dressing: Wound #3 Left,Lateral Lower Leg: Hydrogel Secondary Dressing: Wound #3 Left,Lateral Lower Leg: Boardered Foam Dressing Dressing Change Frequency: Wound #3 Left,Lateral Lower Leg: Change dressing every day. Follow-up Appointments: Wound #3 Left,Lateral Lower Leg: Return Appointment in 1 week. This 81 year old gentleman who has diabetes mellitus, significant peripheral vascular disease and now has been on chemotherapy for pancreatic cancer has a lacerated wound to the left lower extremity complicated with significant cellulitis. I'm concerned that his white count may have dropped down again and he may need IV antibiotics, local care and elevation of his limbs in hospital with appropriate workup for his peripheral vascular disease by Dr. Leotis Pain who is his vascular surgeon. I have put in a call to his medical oncologist and will discuss the case with him and send him to the hospital for appropriate admission. Treatment plan has been discussed with the patient and his son who is at the bedside and we will await a call back. Notes Addendum: I tried to speak to his medical oncologist Dr. Grayland Ormond who was unavailable. I spoke to his poor PCP Dr. Juanetta Beets, and discussed the fact that the patient has had a significant cellulitis and possible neutropenia and would need appropriate oral or IV antibiotics after a CBCs done. I was also concerned about the vascularity of his left lower extremity. Dr. Caryn Section  agreed to see him in the office right away and would take it from there. Electronic Signature(s) Signed: 06/27/2015 10:10:39 AM By: Christin Fudge MD, FACS Previous Signature: 06/27/2015 9:51:48 AM Version By: Christin Fudge MD, FACS [name], [name] ([id number]) Entered By: Christin Fudge on 06/27/2015 10:10:38 [name], [name] ([id number]) -------------------------------------------------------------------------------- ROS/PFSH Details Patient Name: [name]. Date of Service: 06/27/2015 8:45 AM Medical Record Number: [id number] Patient Account Number: [id number] Date of Birth/Sex: 07-27-1925 (81 y.o. Male) Treating RN: Baruch Gouty, RN, BSN, [place] Primary Care Physician: Lelon Huh Other Clinician: Referring Physician: Lelon Huh Treating Physician/Extender: Frann Rider in Treatment: 0 Label Progress Note Print Version as History and Physical for this encounter Information Obtained From Patient Wound History Do you currently have one or more open woundso Yes How many open wounds do you currently haveo 1 Approximately how long have you had your woundso 1.5week How have you been treating your wound(s) until nowo prisma Has your wound(s) ever healed and then re-openedo No Have you had any lab work done in the past montho No Have you tested positive for an antibiotic resistant organism (MRSA, VRE)o No Have you tested positive for osteomyelitis (bone infection)o No Have you had any tests for circulation on your legso No Have you had other problems associated with your woundso Infection, Swelling Eyes Complaints and Symptoms: Positive  for: Dry Eyes Medical History: Positive for: Cataracts - removed 09/2013 (B) Negative for: Glaucoma; Optic Neuritis Hematologic/Lymphatic Complaints and Symptoms: Positive for: Bleeding / Clotting Disorders Medical History: Negative for: Anemia; Hemophilia; Human Immunodeficiency Virus; Lymphedema; Sickle Cell Disease Past Medical  History Notes: polycythemia; pernicious anemia Integumentary (Skin) Complaints and Symptoms: Positive for: Bleeding or bruising tendency Medical History: Negative for: History of Burn; History of pressure wounds Barb, Javonta H. ([id number]) Constitutional Symptoms (General Health) Medical History: Past Medical History Notes: HLD Ear/Nose/Mouth/Throat Complaints and Symptoms: No Complaints or Symptoms Medical History: Negative for: Chronic sinus problems/congestion; Middle ear problems Respiratory Complaints and Symptoms: No Complaints or Symptoms Medical History: Negative for: Asthma; Chronic Obstructive Pulmonary Disease (COPD); Pneumothorax; Sleep Apnea; Tuberculosis Cardiovascular Complaints and Symptoms: No Complaints or Symptoms Medical History: Positive for: Arrhythmia - Afib; Congestive Heart Failure; Hypertension; Peripheral Arterial Disease Negative for: Angina; Coronary Artery Disease; Deep Vein Thrombosis; Hypotension; Myocardial Infarction; Peripheral Venous Disease; Phlebitis; Vasculitis Past Medical History Notes: (B) carotid artery disease; angioplasty 2009/2010; carotid endarterectomy 08/15/2014; cardiac stent placed 08/14/2015 pacemaker Gastrointestinal Complaints and Symptoms: No Complaints or Symptoms Medical History: Negative for: Cirrhosis ; Colitis; Crohnos; Hepatitis A; Hepatitis B; Hepatitis C Past Medical History Notes: IBS; GERD Endocrine Medical History: Positive for: Type II Diabetes - 01/10/15 A1C 8.8 Negative for: Type I Diabetes JEHAN, BONANO ([id number]) Past Medical History Notes: pancreatitis; appendectomy Time with diabetes: 2 years Treated with: Insulin Blood sugar tested every day: Yes Tested : daily Blood sugar testing results: Breakfast: 133 Genitourinary Complaints and Symptoms: No Complaints or Symptoms Medical History: Negative for: End Stage Renal Disease Past Medical History Notes: CKD stg III; testicular  mass Immunological Complaints and Symptoms: No Complaints or Symptoms Medical History: Negative for: Lupus Erythematosus; Raynaudos; Scleroderma Musculoskeletal Medical History: Positive for: Gout; Osteoarthritis Negative for: Rheumatoid Arthritis; Osteomyelitis Past Medical History Notes: Lumbago Neurologic Complaints and Symptoms: No Complaints or Symptoms Medical History: Positive for: Neuropathy Negative for: Dementia; Quadriplegia; Paraplegia; Seizure Disorder Oncologic Medical History: Positive for: Received Chemotherapy - pancreatic cancer Negative for: Received Radiation Past Medical History Notes: Skin CA- (B) ears/neck Zynda, Quitman H. ([id number]) Psychiatric Complaints and Symptoms: No Complaints or Symptoms Medical History: Negative for: Anorexia/bulimia; Confinement Anxiety [facility] Extended History Items Eyes: Cataracts Hospitalization / Surgery History Name of Hospital Purpose of Hospitalization/Surgery Date [facility] Appendectomy 09/13/1960 [place] Angioplasty 09/14/2007 [place] Angioplasty 09/13/2008 Skin CA excision 12/13/2014 Family and Social History Cancer: Yes - Siblings; Diabetes: Yes - Mother; Heart Disease: No; Hereditary Spherocytosis: No; Hypertension: No; Kidney Disease: Yes - Father; Lung Disease: No; Seizures: No; Stroke: No; Thyroid Problems: No; Never smoker; Marital Status - Married; Alcohol Use: Never; Drug Use: No History; Caffeine Use: Daily; Advanced Directives: Yes (Not Provided); Patient does not want information on Advanced Directives; Do not resuscitate: No; Living Will: Yes (Not Provided) Physician Affirmation I have reviewed and agree with the above information. Electronic Signature(s) Signed: 06/27/2015 9:34:54 AM By: Christin Fudge MD, FACS Signed: 06/27/2015 3:30:53 PM By: Regan Lemming BSN, RN Previous Signature: 06/27/2015 9:04:30 AM Version By: Regan Lemming BSN, RN Entered By: Christin Fudge on 06/27/2015 09:34:51 MARKEVIOUS, EHMKE  ([id number]) -------------------------------------------------------------------------------- SuperBill Details Patient Name: [name]. Date of Service: 06/27/2015 Medical Record Number: [id number] Patient Account Number: [id number] Date of Birth/Sex: 1924-10-30 (81 y.o. 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 L03.116 Cellulitis of left lower limb Facility Procedures CPT4 Code: 36629476 Description: 99213 - WOUND CARE VISIT-LEV 3 EST PT Modifier: Quantity: 1 Physician Procedures CPT4: Description Modifier Quantity Code 5465035 46568 - WC PHYS LEVEL 4 - EST PT 1 ICD-10 Description Diagnosis E11.622 Type 2 diabetes mellitus with other skin ulcer I70.632 Atherosclerosis of nonbiological bypass graft(s) of the right leg with  ulceration of calf L97.222 Non-pressure chronic ulcer of left calf with fat layer exposed L03.116 Cellulitis of left lower limb Electronic Signature(s) Signed: 06/27/2015 9:52:05 AM By: Christin Fudge MD, FACS Entered By: Christin Fudge on 06/27/2015 09:52:05

## 2015-06-27 NOTE — Progress Notes (Signed)
ANTIBIOTIC CONSULT NOTE - INITIAL  Pharmacy Consult for vancomycin Indication: cellulitis  Allergies  Allergen Reactions  . Adhesive [Tape] Other (See Comments)    Patient states that adhesive tape causes his skin to tear very easily and asks for paper tape ONLY  . Crestor [Rosuvastatin Calcium] Rash  . Penicillins Rash    Patient Measurements: Height: 72", Ideal Body Weight: 78kg Total Body Weight: 89kg  Vital Signs: Temp: 98.4 F (36.9 C) (10/14 1308) Temp Source: Oral (10/14 1308) BP: 139/71 mmHg (10/14 1308) Pulse Rate: 62 (10/14 1308)  Labs: No results for input(s): WBC, HGB, PLT, LABCREA, CREATININE in the last 72 hours. Estimated Creatinine Clearance: 38.1 mL/min (by C-G formula based on Cr of 1.47). No results for input(s): VANCOTROUGH, VANCOPEAK, VANCORANDOM, GENTTROUGH, GENTPEAK, GENTRANDOM, TOBRATROUGH, TOBRAPEAK, TOBRARND, AMIKACINPEAK, AMIKACINTROU, AMIKACIN in the last 72 hours.   Microbiology: No results found for this or any previous visit (from the past 720 hour(s)).  Assessment: Pharmacy consulted to dose vancomycin for cellulitis in this 81 year old male.   PK parameters: Ke: 0.036, t1/2: 19h, Vd: 62L (using total body weight)  Goal of Therapy:  Vancomycin trough level 10-15 mcg/ml  Plan:  Vancomycin 1500mg  IV x 1 ordered, followed by 1gm IV Q24H to start 16 hours after initial dose for stacked dosing. Will check trough prior to 4th dose, which will be at steady state.  Pharmacy to follow per consult  Rexene Edison, PharmD Clinical Pharmacist  06/27/2015 3:28 PM

## 2015-06-27 NOTE — Progress Notes (Signed)
[name] ([id number]) Visit Report for 06/27/2015 Allergy List Details Patient Name: [name]. Date of Service: 06/27/2015 8:45 AM Medical Record Number: [id number] Patient Account Number: [id number] Date of Birth/Sex: 13-Apr-1925 (81 y.o. Male) Treating RN: Baruch Gouty, RN, BSN, Velva Harman Primary Care Physician: Lelon Huh Other Clinician: Referring Physician: Lelon Huh Treating Physician/Extender: Frann Rider in Treatment: 0 Allergies Active Allergies Crestor- Hyperlipidemics Penicillins Allergy Notes Electronic Signature(s) Signed: 06/27/2015 8:57:16 AM By: Regan Lemming BSN, RN Entered By: Regan Lemming on 06/27/2015 08:57:16 Kalter, [name] ([id number]) -------------------------------------------------------------------------------- Arrival Information Details Patient Name: [name]. Date of Service: 06/27/2015 8:45 AM Medical Record Number: [id number] Patient Account Number: [id number] Date of Birth/Sex: 1925-01-24 (81 y.o. Male) Treating RN: Baruch Gouty, RN, BSN, Velva Harman Primary Care Physician: Lelon Huh Other Clinician: Referring Physician: Lelon Huh Treating Physician/Extender: Frann Rider in Treatment: 0 Visit Information Patient Arrived: [place] Time: 08:54 Accompanied By: son Transfer Assistance: None Patient Identification Verified: Yes Secondary Verification Process Completed: Yes Patient Requires Transmission-Based No Precautions: Patient Has Alerts: No History Since Last Visit Added or deleted any medications: No Any new allergies or adverse reactions: No Had a fall or experienced change in activities of daily living that may affect risk of falls: No Signs or symptoms of abuse/neglect since last visito No Electronic Signature(s) Signed: 06/27/2015 8:56:43 AM By: Regan Lemming BSN, RN Entered By: Regan Lemming on 06/27/2015 08:56:43 Favata, [name]  ([id number]) -------------------------------------------------------------------------------- Clinic Level of Care Assessment Details Patient Name: [name]. Date of Service: 06/27/2015 8:45 AM Medical Record Number: [id number] Patient Account Number: [id number] Date of Birth/Sex: August 18, 1925 (81 y.o. Male) Treating RN: Baruch Gouty, RN, BSN, Velva Harman Primary Care Physician: Lelon Huh Other Clinician: Referring Physician: Lelon Huh Treating Physician/Extender: Frann Rider in Treatment: 0 Clinic Level of Care Assessment Items TOOL 2 Quantity Score []  - Use when only an EandM is performed on the INITIAL visit 0 ASSESSMENTS - Nursing Assessment / Reassessment X - General Physical Exam (combine w/ comprehensive assessment (listed just 1 20 below) when performed on new pt. evals) X - Comprehensive Assessment (HX, ROS, Risk Assessments, Wounds Hx, etc.) 1 25 ASSESSMENTS - Wound and Skin Assessment / Reassessment X - Simple Wound Assessment / Reassessment - one wound 1 5 []  - Complex Wound Assessment / Reassessment - multiple wounds 0 []  - Dermatologic / Skin Assessment (not related to wound area) 0 ASSESSMENTS - Ostomy and/or Continence Assessment and Care []  - Incontinence Assessment and Management 0 []  - Ostomy Care Assessment and Management (repouching, etc.) 0 PROCESS - Coordination of Care X - Simple Patient / Family Education for ongoing care 1 15 []  - Complex (extensive) Patient / Family Education for ongoing care 0 []  - Staff obtains Programmer, systems, Records, Test Results / Process Orders 0 []  - Staff telephones HHA, Nursing Homes / Clarify orders / etc 0 []  - Routine Transfer to another Facility (non-emergent condition) 0 []  - Routine Hospital Admission (non-emergent condition) 0 X - New Admissions / Biomedical engineer / Ordering NPWT, Apligraf, etc. 1 15 []  - Emergency Hospital Admission (emergent condition) 0 []  - Simple Discharge Coordination 0 Stroder, Roderick H.  ([id number]) []  - Complex (extensive) Discharge Coordination 0 PROCESS - Special Needs []  - Pediatric / Minor Patient Management 0 []  - Isolation Patient Management 0 []  - Hearing / Language / Visual special needs 0 []  - Assessment of Community assistance (transportation, D/C planning, etc.) 0 []  - Additional assistance / Altered mentation 0 []  - Support Surface(s) Assessment (bed,  cushion, seat, etc.) 0 INTERVENTIONS - Wound Cleansing / Measurement X - Wound Imaging (photographs - any number of wounds) 1 5 []  - Wound Tracing (instead of photographs) 0 X - Simple Wound Measurement - one wound 1 5 []  - Complex Wound Measurement - multiple wounds 0 X - Simple Wound Cleansing - one wound 1 5 []  - Complex Wound Cleansing - multiple wounds 0 INTERVENTIONS - Wound Dressings X - Small Wound Dressing one or multiple wounds 1 10 []  - Medium Wound Dressing one or multiple wounds 0 []  - Large Wound Dressing one or multiple wounds 0 []  - Application of Medications - injection 0 INTERVENTIONS - Miscellaneous []  - External ear exam 0 []  - Specimen Collection (cultures, biopsies, blood, body fluids, etc.) 0 []  - Specimen(s) / Culture(s) sent or taken to Lab for analysis 0 []  - Patient Transfer (multiple staff / Harrel Lemon Lift / Similar devices) 0 []  - Simple Staple / Suture removal (25 or less) 0 []  - Complex Staple / Suture removal (26 or more) 0 Ailey, Thurmon H. ([id number]) []  - Hypo / Hyperglycemic Management (close monitor of Blood Glucose) 0 []  - Ankle / Brachial Index (ABI) - do not check if billed separately 0 Has the patient been seen at the hospital within the last three years: Yes Total Score: 105 Level Of Care: New/Established - Level 3 Electronic Signature(s) Signed: 06/27/2015 9:25:13 AM By: Regan Lemming BSN, RN Entered By: Regan Lemming on 06/27/2015 09:25:13 Mcduffee, [name] ([id number]) -------------------------------------------------------------------------------- Encounter  Discharge Information Details Patient Name: [name]. Date of Service: 06/27/2015 8:45 AM Medical Record Number: [id number] Patient Account Number: [id number] Date of Birth/Sex: 15-Mar-1925 (81 y.o. Male) Treating RN: Baruch Gouty, RN, BSN, Velva Harman Primary Care Physician: Lelon Huh Other Clinician: Referring Physician: Lelon Huh Treating Physician/Extender: Frann Rider in Treatment: 0 Encounter Discharge Information Items Discharge Pain Level: 0 Discharge Condition: Stable Ambulatory Status: Cane Discharge Destination: Home Private Transportation: Auto Accompanied By: son Schedule Follow-up Appointment: No Medication Reconciliation completed and No provided to Patient/Care Marla Pouliot: Clinical Summary of Care: Electronic Signature(s) Signed: 06/27/2015 9:25:59 AM By: Regan Lemming BSN, RN Entered By: Regan Lemming on 06/27/2015 09:25:59 Worton, [name] ([id number]) -------------------------------------------------------------------------------- Lower Extremity Assessment Details Patient Name: [name]. Date of Service: 06/27/2015 8:45 AM Medical Record Number: [id number] Patient Account Number: [id number] Date of Birth/Sex: 04-Aug-1925 (81 y.o. Male) Treating RN: Baruch Gouty, RN, BSN, [place] Primary Care Physician: Lelon Huh Other Clinician: Referring Physician: Lelon Huh Treating Physician/Extender: Frann Rider in Treatment: 0 Edema Assessment Assessed: [Left: No] [Right: No] Edema: [Left: Ye] [Right: s] Calf Left: Right: Point of Measurement: 38 cm From Medial Instep 38.3 cm cm Ankle Left: Right: Point of Measurement: 10 cm From Medial Instep 25 cm cm Vascular Assessment Pulses: Posterior Tibial Palpable: [Left:No] Doppler: [Left:Monophasic] Dorsalis Pedis Palpable: [Left:No] Doppler: [Left:Monophasic] Extremity colors, hair growth, and conditions: Extremity Color: [Left:Red] Hair Growth on Extremity: [Left:No] Temperature of  Extremity: [Left:Warm] Capillary Refill: [Left:< 3 seconds] Toe Nail Assessment Left: Right: Thick: No Discolored: No Deformed: No Improper Length and Hygiene: No Electronic Signature(s) Signed: 06/27/2015 9:12:51 AM By: Regan Lemming BSN, RN Didio, [name] ([id number]) Entered By: Regan Lemming on 06/27/2015 09:12:51 Walpole, [name] ([id number]) -------------------------------------------------------------------------------- Multi Wound Chart Details Patient Name: [name]. Date of Service: 06/27/2015 8:45 AM Medical Record Number: [id number] Patient Account Number: [id number] Date of Birth/Sex: 1924/12/06 (81 y.o. Male) Treating RN: Baruch Gouty, RN, BSN, Velva Harman Primary Care Physician: Lelon Huh Other Clinician: Referring Physician: Lelon Huh  Treating Physician/Extender: Frann Rider in Treatment: 0 Vital Signs Height(in): 72 Pulse(bpm): 57 Weight(lbs): Blood Pressure 126/45 (mmHg): Body Mass Index(BMI): Temperature(F): 98.4 Respiratory Rate 18 (breaths/min): Photos: [3:No Photos] [N/A:N/A] Wound Location: [3:Left Lower Leg - Lateral] [N/A:N/A] Wounding Event: [3:Shear/Friction] [N/A:N/A] Primary Etiology: [3:Diabetic Wound/Ulcer of the Lower Extremity] [N/A:N/A] Comorbid History: [3:Cataracts, Arrhythmia, Congestive Heart Failure, Hypertension, Peripheral Arterial Disease, Type II Diabetes, Gout, Osteoarthritis, Neuropathy, Received Chemotherapy] [N/A:N/A] Date Acquired: [3:06/16/2015] [N/A:N/A] Weeks of Treatment: [3:0] [N/A:N/A] Wound Status: [3:Open] [N/A:N/A] Measurements L x W x D 2.4x11.4x0.1 [N/A:N/A] (cm) Area (cm) : [3:21.488] [N/A:N/A] Volume (cm) : [3:2.149] [N/A:N/A] % Reduction in Area: [3:0.00%] [N/A:N/A] % Reduction in Volume: 0.00% [N/A:N/A] Classification: [3:Grade 1] [N/A:N/A] Exudate Amount: [3:Small] [N/A:N/A] Exudate Type: [3:Serous] [N/A:N/A] Exudate Color: [3:amber] [N/A:N/A] Wound Margin: [3:Distinct, outline  attached] [N/A:N/A] Granulation Amount: [3:Small (1-33%)] [N/A:N/A] Granulation Quality: [3:Pink, Pale] [N/A:N/A] Necrotic Amount: [3:Medium (34-66%)] [N/A:N/A] Necrotic Tissue: Eschar, Adherent Slough N/A N/A Exposed Structures: Fascia: No N/A N/A Fat: No Tendon: No Muscle: No Joint: No Bone: No Limited to Skin Breakdown Epithelialization: None N/A N/A Periwound Skin Texture: Edema: Yes N/A N/A Excoriation: No Induration: No Callus: No Crepitus: No Fluctuance: No Friable: No Rash: No Scarring: No Periwound Skin Moist: Yes N/A N/A Moisture: Maceration: No Dry/Scaly: No Periwound Skin Color: Erythema: Yes N/A N/A Atrophie Blanche: No Cyanosis: No Ecchymosis: No Hemosiderin Staining: No Mottled: No Pallor: No Rubor: No Erythema Location: Circumferential N/A N/A Temperature: No Abnormality N/A N/A Tenderness on Yes N/A N/A Palpation: Wound Preparation: Ulcer Cleansing: N/A N/A Rinsed/Irrigated with Saline Topical Anesthetic Applied: Other: lidocaine 4% Treatment Notes Electronic Signature(s) Signed: 06/27/2015 9:21:34 AM By: Regan Lemming BSN, RN Entered By: Regan Lemming on 06/27/2015 09:21:34 DERYK, BOZMAN ([id number]) -------------------------------------------------------------------------------- [place] Details Patient Name: [name]. Date of Service: 06/27/2015 8:45 AM Medical Record Number: [id number] Patient Account Number: [id number] Date of Birth/Sex: 25-Dec-1924 (81 y.o. Male) Treating RN: Baruch Gouty, RN, BSN, Velva Harman Primary Care Physician: Lelon Huh Other Clinician: Referring Physician: Lelon Huh Treating Physician/Extender: Frann Rider in Treatment: 0 Active Inactive Electronic Signature(s) Signed: 06/27/2015 9:21:20 AM By: Regan Lemming BSN, RN Entered By: Regan Lemming on 06/27/2015 09:21:20 [name]  ([id number]) -------------------------------------------------------------------------------- Pain Assessment Details Patient Name: [name]. Date of Service: 06/27/2015 8:45 AM Medical Record Number: [id number] Patient Account Number: [id number] Date of Birth/Sex: 04-Jan-1925 (81 y.o. Male) Treating RN: Baruch Gouty, RN, BSN, Velva Harman Primary Care Physician: Lelon Huh Other Clinician: Referring Physician: Lelon Huh Treating Physician/Extender: Frann Rider in Treatment: 0 Active Problems Location of Pain Severity and Description of Pain Patient Has Paino Yes Site Locations Pain Location: Pain in Ulcers Rate the pain. Current Pain Level: 6 Character of Pain Describe the Pain: Tender Pain Management and Medication Current Pain Management: How does your pain impact your activities of daily livingo Sleep: Yes Bathing: Yes Appetite: Yes Relationship With Others: Yes Bladder Continence: Yes Emotions: Yes Bowel Continence: Yes Work: Yes Toileting: Yes Drive: Yes Dressing: Yes Hobbies: Yes Electronic Signature(s) Signed: 06/27/2015 8:57:05 AM By: Regan Lemming BSN, RN Entered By: Regan Lemming on 06/27/2015 08:57:05 Ishida, [name] ([id number]) -------------------------------------------------------------------------------- Patient/Caregiver Education Details Patient Name: [name]. Date of Service: 06/27/2015 8:45 AM Medical Record Number: [id number] Patient Account Number: [id number] Date of Birth/Gender: 25-Sep-1924 (81 y.o. Male) Treating RN: Baruch Gouty, RN, BSN, Velva Harman Primary Care Physician: Lelon Huh Other Clinician: Referring Physician: Lelon Huh Treating Physician/Extender: Frann Rider in Treatment: 0 Education Assessment Education Provided To: Patient Education Topics Provided  Basic Hygiene: Methods: Explain/Verbal Responses: State content correctly Wound/Skin Impairment: Methods: Explain/Verbal Responses: State content  correctly Electronic Signature(s) Signed: 06/27/2015 9:26:13 AM By: Regan Lemming BSN, RN Entered By: Regan Lemming on 06/27/2015 09:26:13 Wysong, [name] ([id number]) -------------------------------------------------------------------------------- Wound Assessment Details Patient Name: Joanna Puff H. Date of Service: 06/27/2015 8:45 AM Medical Record Number: [id number] Patient Account Number: [id number] Date of Birth/Sex: September 01, 1925 (81 y.o. Male) Treating RN: Baruch Gouty, RN, BSN, [place] Primary Care Physician: Lelon Huh Other Clinician: Referring Physician: Lelon Huh Treating Physician/Extender: Frann Rider in Treatment: 0 Wound Status Wound Number: 3 Primary Diabetic Wound/Ulcer of the Lower Etiology: Extremity Wound Location: Left Lower Leg - Lateral Wound Open Wounding Event: Shear/Friction Status: Date Acquired: 06/16/2015 Comorbid Cataracts, Arrhythmia, Congestive Heart Weeks Of Treatment: 0 History: Failure, Hypertension, Peripheral Arterial Clustered Wound: No Disease, Type II Diabetes, Gout, Osteoarthritis, Neuropathy, Received Chemotherapy Photos Photo Uploaded By: Regan Lemming on 06/27/2015 11:21:22 Wound Measurements Length: (cm) 2.4 Width: (cm) 11.4 Depth: (cm) 0.1 Area: (cm) 21.488 Volume: (cm) 2.149 % Reduction in Area: 0% % Reduction in Volume: 0% Epithelialization: None Tunneling: No Undermining: No Wound Description Classification: Grade 1 Foul Odor Aft Wound Margin: Distinct, outline attached Exudate Amount: Small Exudate Type: Serous Exudate Color: amber er Cleansing: No Wound Bed Granulation Amount: Small (1-33%) Exposed Structure Granulation Quality: Pink, Pale Fascia Exposed: No Locken, Rochelle H. ([id number]) Necrotic Amount: Medium (34-66%) Fat Layer Exposed: No Necrotic Quality: Eschar, Adherent Slough Tendon Exposed: No Muscle Exposed: No Joint Exposed: No Bone Exposed: No Limited to Skin Breakdown Periwound Skin  Texture Texture Color No Abnormalities Noted: No No Abnormalities Noted: No Callus: No Atrophie Blanche: No Crepitus: No Cyanosis: No Excoriation: No Ecchymosis: No Fluctuance: No Erythema: Yes Friable: No Erythema Location: Circumferential Induration: No Hemosiderin Staining: No Localized Edema: Yes Mottled: No Rash: No Pallor: No Scarring: No Rubor: No Moisture Temperature / Pain No Abnormalities Noted: No Temperature: No Abnormality Dry / Scaly: No Tenderness on Palpation: Yes Maceration: No Moist: Yes Wound Preparation Ulcer Cleansing: Rinsed/Irrigated with Saline Topical Anesthetic Applied: Other: lidocaine 4%, Treatment Notes Wound #3 (Left, Lateral Lower Leg) 1. Cleansed with: Clean wound with Normal Saline 4. Dressing Applied: Hydrogel 5. Secondary Dressing Applied Bordered Foam Dressing Electronic Signature(s) Signed: 06/27/2015 9:08:28 AM By: Regan Lemming BSN, RN Entered By: Regan Lemming on 06/27/2015 09:08:27 [name] ([id number]) -------------------------------------------------------------------------------- Vitals Details Patient Name: [name]. Date of Service: 06/27/2015 8:45 AM Medical Record Number: [id number] Patient Account Number: [id number] Date of Birth/Sex: 01-15-1925 (81 y.o. Male) Treating RN: Baruch Gouty, RN, BSN, Velva Harman Primary Care Physician: Lelon Huh Other Clinician: Referring Physician: Lelon Huh Treating Physician/Extender: Frann Rider in Treatment: 0 Vital Signs Time Taken: 09:08 Temperature (F): 98.4 Height (in): 72 Pulse (bpm): 57 Source: Stated Respiratory Rate (breaths/min): 18 Blood Pressure (mmHg): 126/45 Reference Range: 80 - 120 mg / dl Electronic Signature(s) Signed: 06/27/2015 9:09:30 AM By: Regan Lemming BSN, RN Entered By: Regan Lemming on 06/27/2015 09:09:30

## 2015-06-27 NOTE — H&P (Signed)
[place] [facility] [place] NAME: [name]    MR#:  [id number]  DATE OF BIRTH:  1925/08/04  DATE OF ADMISSION:  06/27/2015  PRIMARY CARE PHYSICIAN: Lelon Huh, MD   REQUESTING/REFERRING PHYSICIAN: Dr. Caryn Section  CHIEF COMPLAINT:  No chief complaint on file.  Left leg redness, swelling and pain  HISTORY OF PRESENT ILLNESS:  [name]  is a 81 y.o. male with a known history of peripheral vascular disease, diabetes with peripheral neuropathy, pancreatic cancer having just had his second chemotherapy treatment 1 week ago presents with swelling and pain of the right leg. He presented to his primary care provider this morning who directed him to the hospital for IV antibiotics and further treatment of cellulitis. There is a dry scabbed area over the tip of the left great toe which she states has been present for several weeks. There is a wound on the lateral left lower leg which came from an injury several days ago. He states that the redness and swelling started 2 days ago and he has been trying to "treated at home" without antibiotics but it has gotten much worse. Currently unable to put weight on the leg. No nausea vomiting fevers chills or diarrhea.  PAST MEDICAL HISTORY:   Past Medical History  Diagnosis Date  . Diabetes mellitus without complication ([place])   . Hypertension   . PAD (peripheral artery disease) ([place])   . Gout   . Hypothyroidism   . History of chicken pox   . History of measles as a child   . History of mumps as a child   . SSS (sick sinus syndrome) ([place])   . Chronic kidney disease     ckd  . GERD (gastroesophageal reflux disease)   . Neuropathy ([place])   . Dysrhythmia     a fib  . Cancer [facility])     Skin cancer  . Pancreatic mass 04/2015    scheduled for biopsy on 05/08/2015  . Presence of permanent cardiac pacemaker     PAST SURGICAL HISTORY:   Past Surgical History  Procedure Laterality Date  . Carotid  endarterectomy Right 2015    Dr. Lucky Cowboy  . Heart stents  2015    one stent per patient  . Bilateral leg stents Bilateral 2015    Bilateral iliac and right popliteal  . Skin cancer excision    . Cataract extraction Bilateral 09/2013  . Angioplasty  2009, 2010    done by Dr. Hulda Humphrey and Dr. Dian Situ  . Eye surgery    . Coronary angioplasty  2015  . Appendectomy  1962  . Pacemaker insertion N/A 04/30/2015    Procedure: INSERTION PACEMAKER;  Surgeon: Isaias Cowman, MD;  Location: [facility] ORS;  Service: Cardiovascular;  Laterality: N/A;  . Upper esophageal endoscopic ultrasound (eus) N/A 05/08/2015    Procedure: UPPER ESOPHAGEAL ENDOSCOPIC ULTRASOUND (EUS);  Surgeon: Cora Daniels, MD;  Location: [facility] ENDOSCOPY;  Service: Endoscopy;  Laterality: N/A;  . Peripheral vascular catheterization N/A 06/04/2015    Procedure: Porta Cath Insertion;  Surgeon: [name], MD;  Location: [place] CV LAB;  Service: Cardiovascular;  Laterality: N/A;    SOCIAL HISTORY:   Social History  Substance Use Topics  . Smoking status: Never Smoker   . Smokeless tobacco: Never Used  . Alcohol Use: No   Lives at home with his wife, uses a cane  FAMILY HISTORY:   Family History  Problem Relation Age of Onset  .  Diabetes Mother   . Cancer Sister     Uterine  . Arthritis Father   . Kidney disease Father     DRUG ALLERGIES:   Allergies  Allergen Reactions  . Adhesive [Tape] Other (See Comments)    Patient states that adhesive tape causes his skin to tear very easily and asks for paper tape ONLY  . Crestor [Rosuvastatin Calcium] Rash  . Penicillins Rash    REVIEW OF SYSTEMS:   Review of Systems  Constitutional: Negative for fever, chills, weight loss and malaise/fatigue.  HENT: Negative for congestion and hearing loss.   Eyes: Negative for blurred vision and pain.  Respiratory: Negative for cough, hemoptysis, sputum production, shortness of breath and stridor.   Cardiovascular: Positive  for leg swelling. Negative for chest pain, palpitations and orthopnea.  Gastrointestinal: Positive for abdominal pain. Negative for nausea, vomiting, diarrhea, constipation and blood in stool.  Genitourinary: Negative for dysuria and frequency.  Musculoskeletal: Negative for myalgias, back pain, joint pain and neck pain.  Skin: Negative for rash.  Neurological: Negative for focal weakness, loss of consciousness and headaches.  Endo/Heme/Allergies: Bruises/bleeds easily.  Psychiatric/Behavioral: Negative for depression and hallucinations. The patient is not nervous/anxious.     MEDICATIONS AT HOME:   Prior to Admission medications   Medication Sig Start Date End Date Taking? Authorizing Provider  allopurinol (ZYLOPRIM) 100 MG tablet TAKE 2 TABLETS BY MOUTH EVERY MORNING 05/19/15   Birdie Sons, MD  bumetanide (BUMEX) 2 MG tablet Take 2 mg by mouth daily. 1 or 2 tablets AS NEEDED    Historical Provider, MD  clopidogrel (PLAVIX) 75 MG tablet  04/24/15   Historical Provider, MD  gabapentin (NEURONTIN) 300 MG capsule Take 3 capsules (900 mg total) by mouth at bedtime. 04/15/15   Birdie Sons, MD  Garlic 3818 MG CAPS Take by mouth daily.    Historical Provider, MD  glipiZIDE (GLUCOTROL) 5 MG tablet Take by mouth 2 (two) times daily before a meal.    Historical Provider, MD  Insulin Glargine (TOUJEO SOLOSTAR) 300 UNIT/ML SOPN Inject 10 Units into the skin daily. 01/21/15   Historical Provider, MD  levothyroxine (SYNTHROID, LEVOTHROID) 112 MCG tablet Take 112 mcg by mouth daily before breakfast.    Historical Provider, MD  lidocaine-prilocaine (EMLA) cream Apply 1 application topically as needed. Apply to port then cover with saran wrap 1-2 hours before chemotherapy appointment 05/29/15   Lloyd Huger, MD  lisinopril (PRINIVIL,ZESTRIL) 10 MG tablet Take 5 mg by mouth daily.     Historical Provider, MD  montelukast (SINGULAIR) 10 MG tablet Take 10 mg by mouth at bedtime.    Historical Provider,  MD  Omega-3 Fatty Acids (FISH OIL) 1000 MG CAPS Take by mouth 2 (two) times daily.    Historical Provider, MD  oxyCODONE-acetaminophen (PERCOCET/ROXICET) 5-325 MG tablet Take 1 tablet by mouth every 8 (eight) hours as needed for moderate pain. 06/20/15   Birdie Sons, MD  pravastatin (PRAVACHOL) 40 MG tablet Take 40 mg by mouth daily.    Historical Provider, MD  prochlorperazine (COMPAZINE) 10 MG tablet Take 1 tablet (10 mg total) by mouth every 6 (six) hours as needed for nausea or vomiting. 05/29/15   Lloyd Huger, MD  ranitidine (ZANTAC) 150 MG tablet Take 150 mg by mouth daily. 1-2 tabs daily    Historical Provider, MD  silver sulfADIAZINE (SILVADENE) 1 % cream Apply 1 application topically 2 (two) times daily. to affected area 01/10/15   Historical Provider, MD  vitamin B-12 (CYANOCOBALAMIN) 1000 MCG tablet Inject 1,000 mcg into the muscle every 30 (thirty) days.    Historical Provider, MD      VITAL SIGNS:  Blood pressure 139/71, pulse 62, temperature 98.4 F (36.9 C), temperature source Oral, SpO2 100 %.  PHYSICAL EXAMINATION:  GENERAL:  81 y.o.-year-old patient lying in the bed with no acute distress.  EYES: Pupils equal, round, reactive to light and accommodation. No scleral icterus. Extraocular muscles intact.  HEENT: Head atraumatic, normocephalic. Oropharynx and nasopharynx clear. Mucous membranes pink and moist NECK:  Supple, no jugular venous distention. No thyroid enlargement, no tenderness.  LUNGS: Normal breath sounds bilaterally, no wheezing, rales,rhonchi or crepitation. No use of accessory muscles of respiration.  CARDIOVASCULAR: S1, S2 normal. No murmurs, rubs, or gallops.  There is a port a catheter in the right upper chest and a pace maker in the left upper chest neither are tender or red ABDOMEN: Soft, diffusely tender to palpation, nondistended. Bowel sounds present. No organomegaly or mass. No guarding no rebound  EXTREMITIES: 1+ pedal edema of the left lower  extremity, no cyanosis, or clubbing. Peripheral pulses are difficult to palpate, we will need to get a Doppler NEUROLOGIC: Cranial nerves II through XII are intact. Muscle strength 5/5 in all extremities. Decreased sensation bilateral lower extremities Gait not checked.  PSYCHIATRIC: The patient is alert and oriented x 3.  SKIN: Bright red erythema over the entirety of the left foot up to the mid left lower leg, scab over the tip of the left great toe with surrounding thick white skin, 3 cm x 3 cm ulceration of the lateral left lower leg with scant purulent Looking drainage. No odor  LABORATORY PANEL:   CBC No results for input(s): WBC, HGB, HCT, PLT in the last 168 hours. ------------------------------------------------------------------------------------------------------------------  Chemistries  No results for input(s): NA, K, CL, CO2, GLUCOSE, BUN, CREATININE, CALCIUM, MG, AST, ALT, ALKPHOS, BILITOT in the last 168 hours.  Invalid input(s): GFRCGP ------------------------------------------------------------------------------------------------------------------  Cardiac Enzymes No results for input(s): TROPONINI in the last 168 hours. ------------------------------------------------------------------------------------------------------------------  RADIOLOGY:  No results found.  EKG:   Orders placed or performed during the hospital encounter of 04/30/15  . EKG 12-Lead in am (before 8am)  . EKG 12-Lead in am (before 8am)    IMPRESSION AND PLAN:   #1 left lower extremity cellulitis: Obtain blood cultures and CBC. He is allergic to penicillins. Start vancomycin. Obtain wound culture. Will also obtain Doppler ultrasound to find pulses and to rule out DVT.  #2 pancreatic cancer: Continue Percocet for pain control.  #3 peripheral vascular disease:  continue Plavix, pravastatin, aspirin  #4 diabetes mellitus type 2: Check hemoglobin A1c start sliding scale insulin and hold oral  hypoglycemic agents for now. He is on too J Ho 10 units daily which is equivalent to Lantus 30 units. Will restart this as well. Continue gabapentin for peripheral neuropathy  #5 hypertension: Continue lisinopril blood pressure well controlled at the time of admission  #6 hypothyroidism: Continue Synthroid  CODE STATUS: full  TOTAL TIME TAKING CARE OF THIS PATIENT: 40 minutes.  Greater than 50% of time spent in coordination of care and counseling. Care plan discussed with the patient has daughter and grandson at the bedside upon admission.  Myrtis Ser M.D on 06/27/2015 at 2:57 PM  Between 7am to 6pm - Pager - [phone]  After 6pm go to [email or web address] - password [email or web address] [facility]  Office  [phone]  CC: Primary care physician; Lelon Huh,  MD

## 2015-06-28 ENCOUNTER — Observation Stay: Payer: Medicare Other

## 2015-06-28 DIAGNOSIS — L03116 Cellulitis of left lower limb: Secondary | ICD-10-CM | POA: Diagnosis not present

## 2015-06-28 LAB — CBC
HCT: 36.4 % — ABNORMAL LOW (ref 40.0–52.0)
Hemoglobin: 12.2 g/dL — ABNORMAL LOW (ref 13.0–18.0)
MCH: 32.1 pg (ref 26.0–34.0)
MCHC: 33.4 g/dL (ref 32.0–36.0)
MCV: 96.3 fL (ref 80.0–100.0)
Platelets: 37 10*3/uL — ABNORMAL LOW (ref 150–440)
RBC: 3.78 MIL/uL — AB (ref 4.40–5.90)
RDW: 16.7 % — AB (ref 11.5–14.5)
WBC: 3.7 10*3/uL — AB (ref 3.8–10.6)

## 2015-06-28 LAB — BASIC METABOLIC PANEL
Anion gap: 5 (ref 5–15)
BUN: 19 mg/dL (ref 6–20)
CALCIUM: 8.2 mg/dL — AB (ref 8.9–10.3)
CHLORIDE: 96 mmol/L — AB (ref 101–111)
CO2: 33 mmol/L — ABNORMAL HIGH (ref 22–32)
CREATININE: 1.37 mg/dL — AB (ref 0.61–1.24)
GFR calc non Af Amer: 44 mL/min — ABNORMAL LOW (ref >60)
GFR, EST AFRICAN AMERICAN: 51 mL/min — AB (ref >60)
Glucose, Bld: 123 mg/dL — ABNORMAL HIGH (ref 65–99)
Potassium: 4.1 mmol/L (ref 3.5–5.1)
SODIUM: 134 mmol/L — AB (ref 135–145)

## 2015-06-28 LAB — GLUCOSE, CAPILLARY
GLUCOSE-CAPILLARY: 110 mg/dL — AB (ref 65–99)
GLUCOSE-CAPILLARY: 169 mg/dL — AB (ref 65–99)
Glucose-Capillary: 225 mg/dL — ABNORMAL HIGH (ref 65–99)
Glucose-Capillary: 171 mg/dL — ABNORMAL HIGH (ref 65–99)

## 2015-06-28 LAB — HEMOGLOBIN A1C: Hgb A1c MFr Bld: 8.8 % — ABNORMAL HIGH (ref 4.0–6.0)

## 2015-06-28 MED ORDER — SODIUM CHLORIDE 0.9 % IJ SOLN
3.0000 mL | INTRAMUSCULAR | Status: DC | PRN
  Administered 2015-06-28: 3 mL via INTRAVENOUS
  Filled 2015-06-28: qty 10

## 2015-06-28 MED ORDER — OXYCODONE-ACETAMINOPHEN 5-325 MG PO TABS
1.0000 | ORAL_TABLET | ORAL | Status: DC | PRN
  Administered 2015-06-28 – 2015-06-30 (×7): 1 via ORAL
  Filled 2015-06-28 (×6): qty 1

## 2015-06-28 MED ORDER — SODIUM CHLORIDE 0.9 % IJ SOLN
10.0000 mL | INTRAMUSCULAR | Status: DC
  Administered 2015-06-28 – 2015-06-30 (×3): 10 mL via INTRAVENOUS

## 2015-06-28 MED ORDER — SODIUM CHLORIDE 0.9 % IJ SOLN: 10.0000 mL | INTRAMUSCULAR | Status: DC | PRN

## 2015-06-28 NOTE — Progress Notes (Signed)
Patient ID: [name], male   DOB: 1924-10-14, 81 y.o.   MRN: [id number] [facility] PROGRESS NOTE  PCP: Lelon Huh, MD  HPI/Subjective: Patient states his leg looks better today than yesterday. Still have pain when walking. But is able to walk to the bathroom. Had some abdominal pain last night but none currently. Some weight loss.  Objective: Filed Vitals:   06/28/15 0825  BP: 157/59  Pulse: 65  Temp: 98 F (36.7 C)  Resp: 20    Filed Weights   06/27/15 1308 06/27/15 1310 06/27/15 1900  Weight: 88.905 kg (196 lb) 88.905 kg (196 lb) 88.905 kg (196 lb)    ROS: Review of Systems  Constitutional: Positive for weight loss. Negative for fever and chills.  Eyes: Negative for blurred vision.  Respiratory: Negative for cough and shortness of breath.   Cardiovascular: Negative for chest pain.  Gastrointestinal: Negative for nausea, vomiting, abdominal pain, diarrhea and constipation.  Genitourinary: Negative for dysuria.  Musculoskeletal: Negative for joint pain.  Neurological: Negative for dizziness and headaches.   Exam: Physical Exam  Constitutional: He is oriented to person, place, and time.  HENT:  Nose: No mucosal edema.  Mouth/Throat: No oropharyngeal exudate or posterior oropharyngeal edema.  Eyes: Conjunctivae, EOM and lids are normal. Pupils are equal, round, and reactive to light.  Neck: No JVD present. Carotid bruit is not present. No edema present. No thyroid mass and no thyromegaly present.  Cardiovascular: S1 normal and S2 normal.  Exam reveals no gallop.   No murmur heard. Pulses:      Dorsalis pedis pulses are 2+ on the right side, and 2+ on the left side.  Respiratory: No respiratory distress. He has no wheezes. He has no rhonchi. He has no rales.  GI: Soft. Bowel sounds are normal. There is no tenderness.  Musculoskeletal:       Right ankle: He exhibits swelling.       Left ankle: He exhibits swelling.  Lymphadenopathy:    He has no  cervical adenopathy.  Neurological: He is alert and oriented to person, place, and time. No cranial nerve deficit.  Skin: Skin is warm. Nails show no clubbing.  Erythema left lower extremity from underneath the knee down into the foot. Scab on the first toe on the left foot tip. Small superficial ulceration left shin which could be the source of the infection.  Psychiatric: He has a normal mood and affect.    Data Reviewed: Basic Metabolic Panel:  Recent Labs Lab 06/27/15 1604 06/28/15 0516  NA 135 134*  K 4.0 4.1  CL 94* 96*  CO2 32 33*  GLUCOSE 180* 123*  BUN 21* 19  CREATININE 1.43* 1.37*  CALCIUM 8.5* 8.2*   Liver Function Tests:  Recent Labs Lab 06/27/15 1604  AST 30  ALT 22  ALKPHOS 115  BILITOT 1.4*  PROT 6.2*  ALBUMIN 3.0*   CBC:  Recent Labs Lab 06/27/15 1604 06/28/15 0516  WBC 4.1 3.7*  HGB 12.5* 12.2*  HCT 38.3* 36.4*  MCV 96.3 96.3  PLT 39* 37*   CBG:  Recent Labs Lab 06/27/15 1650 06/27/15 2116 06/28/15 0815  GLUCAP 162* 155* 110*    Studies: US Venous Img Lower Unilateral Left  06/28/2015  CLINICAL DATA:  Swelling for 3 days. EXAM: LEFT LOWER EXTREMITY VENOUS DOPPLER ULTRASOUND TECHNIQUE: Gray-scale sonography with graded compression, as well as color Doppler and duplex ultrasound were performed to evaluate the lower extremity deep venous systems from the level of  the common femoral vein and including the common femoral, femoral, profunda femoral, popliteal and calf veins including the posterior tibial, peroneal and gastrocnemius veins when visible. The superficial great saphenous vein was also interrogated. Spectral Doppler was utilized to evaluate flow at rest and with distal augmentation maneuvers in the common femoral, femoral and popliteal veins. COMPARISON:  None. FINDINGS: Contralateral Common Femoral Vein: Respiratory phasicity is normal and symmetric with the symptomatic side. No evidence of thrombus. Normal compressibility. Common  Femoral Vein: No evidence of thrombus. Normal compressibility, respiratory phasicity and response to augmentation. Saphenofemoral Junction: No evidence of thrombus. Normal compressibility and flow on color Doppler imaging. Profunda Femoral Vein: No evidence of thrombus. Normal compressibility and flow on color Doppler imaging. Femoral Vein: No evidence of thrombus. Normal compressibility, respiratory phasicity and response to augmentation. Popliteal Vein: No evidence of thrombus. Normal compressibility, respiratory phasicity and response to augmentation. Calf Veins: No evidence of thrombus. Normal compressibility and flow on color Doppler imaging. Superficial Great Saphenous Vein: No evidence of thrombus. Normal compressibility and flow on color Doppler imaging. Venous Reflux:  None. Other Findings:  None. IMPRESSION: No evidence of left lower extremity deep venous thrombosis. Electronically Signed   By: Abigail Miyamoto M.D.   On: 06/28/2015 10:58    Scheduled Meds: . allopurinol  200 mg Oral q morning - 10a  . bumetanide  2 mg Oral Daily  . clopidogrel  75 mg Oral Daily  . famotidine  20 mg Oral Daily  . gabapentin  900 mg Oral QHS  . insulin aspart  0-5 Units Subcutaneous QHS  . insulin aspart  0-9 Units Subcutaneous TID WC  . levothyroxine  112 mcg Oral QAC breakfast  . lisinopril  5 mg Oral Daily  . montelukast  10 mg Oral QHS  . pravastatin  40 mg Oral Daily  . sodium chloride  10 mL Intravenous Q24H  . vancomycin  1,000 mg Intravenous Q24H    Assessment/Plan:  1. Left lower extremity cellulitis in an immunocompromised patient. Patient was on IV vancomycin. I will continue this. Ultrasound of the left lower extremity negative for blood clot. 2. Pancreatic cancer status post 2 cycles of chemotherapy. Some abdominal pain last night but none today. As needed pain medications. Follow up with oncology as outpatient. 3. Type 2 diabetes with neuropathy- currently on sliding scale,  gabapentin 4. Peripheral vascular disease on Plavix 5. Hypothyroidism unspecified continue levothyroxine 6. Essential hypertension- continue usual medications 7. Hyperlipidemia unspecified continue pravastatin. 8. Chronic kidney disease stage III.  Code Status:     Code Status Orders        Start     Ordered   06/27/15 1454  Full code   Continuous     06/27/15 1453    Advance Directive Documentation        Most Recent Value   Type of Advance Directive  Living will   Pre-existing out of facility DNR order (yellow form or pink MOST form)     "MOST" Form in Place?       Disposition Plan: Home soon, may need another day or so of IV antibiotics.  Antibiotics:  Vancomycin  Time spent: 25 minutes  Loletha Grayer  [facility]

## 2015-06-28 NOTE — Evaluation (Signed)
Physical Therapy Evaluation Patient Details Name: [name] MRN: [id number] DOB: February 15, 1925 Today's Date: 06/28/2015   History of Present Illness  Pt with cellulitis, swelling and ulceration of L LE.    Clinical Impression  Pt reports that his swelling has gone down significantly in the L LE and that he is feeling pretty good.  He does have some pain in L great toe with standing and though he does alter how he walks to keep weight off of it he is safe walking w/o the cane.  He has no fatigue with the effort and generally shows good speed and confidence. Pt does not need further PT at this time.   Follow Up Recommendations No PT follow up    Equipment Recommendations       Recommendations for Other Services       Precautions / Restrictions Precautions Precautions: Fall Restrictions Weight Bearing Restrictions: No      Mobility  Bed Mobility Overal bed mobility: Independent                Transfers Overall transfer level: Independent Equipment used: None             General transfer comment: Pt does lean away from L to help unweight great toe, but does not have loss of balance or other safety issues.   Ambulation/Gait Ambulation/Gait assistance: Independent Ambulation Distance (Feet): 200 Feet Assistive device: None       General Gait Details: Again pt is safe and confident, though he does have a limp to avoid WBing on L great toe and has a few instances where he veers R to do this.    Stairs            Wheelchair Mobility    Modified Rankin (Stroke Patients Only)       Balance                                             Pertinent Vitals/Pain Pain Assessment: No/denies pain (does have L toe pain with WBing)    Home Living Family/patient expects to be discharged to:: Private residence Living Arrangements: Spouse/significant other Available Help at Discharge: Family;Friend(s) Type of Home: House Home Access:  Stairs to enter Entrance Stairs-Rails: Can reach both Entrance Stairs-Number of Steps: 4          Prior Function Level of Independence: Independent         Comments: Pt is normally able to be very active and independent.     Hand Dominance        Extremity/Trunk Assessment   Upper Extremity Assessment: Overall WFL for tasks assessed           Lower Extremity Assessment: Overall WFL for tasks assessed         Communication   Communication: No difficulties  Cognition Arousal/Alertness: Awake/alert Behavior During Therapy: WFL for tasks assessed/performed Overall Cognitive Status: Within Functional Limits for tasks assessed                      General Comments      Exercises        Assessment/Plan    PT Assessment Patent does not need any further PT services  PT Diagnosis Difficulty walking;Acute pain   PT Problem List    PT Treatment Interventions     PT Goals (Current goals  can be found in the Care Plan section) Acute Rehab PT Goals Patient Stated Goal: "Get back home"    Frequency     Barriers to discharge        Co-evaluation               End of Session Equipment Utilized During Treatment: Gait belt Activity Tolerance: Patient tolerated treatment well Patient left: with bed alarm set Nurse Communication: Mobility status         Time: 2707-8675 PT Time Calculation (min) (ACUTE ONLY): 20 min   Charges:   PT Evaluation $Initial PT Evaluation Tier I: 1 Procedure     PT G Codes:       Wayne Both, PT, DPT [phone]  Kreg Shropshire 06/28/2015, 5:10 PM

## 2015-06-28 NOTE — Plan of Care (Signed)
Problem: Discharge Progression Outcomes Goal: Other Discharge Outcomes/Goals Outcome: Progressing 1. Continued cellulitis education with vancomycin IV therapy, skin/wound care; reinforced. 2. Pain well controlled with leg elevation and percocet PRN this shift. 3. Up to BR with SB+ with cane; tolerated well. 4. Afebrile today. 5. Tolerating diet well; eating well. 6. Pt and family reddness and edema improved of LLE with current treatment.  Foam dsg intact on left lower leg. Dry, scabbed area on tip of left great toe left open to air; avoided bed linen to rest on left foot. Wound culture to be done on Monday. IV Vancomycin continued.

## 2015-06-28 NOTE — Plan of Care (Signed)
Individualization of Care Hx of pancreatic cancer, DM, HTN, gout, hypothyroidism, PVD, SSS, CKD, GERD, neuropathy  Problem: Discharge Progression Outcomes Goal: Other Discharge Outcomes/Goals Outcome: Progressing Plan of Care Progress to Goals:  Pain: Patient with complaints of pain to left leg/foot.  Given oxycodone per eMAR.  Pain resolved upon reassessment.  Hemo: VSS. Afebrile BP 143/72 mmHg  Pulse 61  Temp(Src) 98.3 F (36.8 C) (Oral)  Resp 20  Ht 6' (1.829 m)  Wt 196 lb (88.905 kg)  BMI 26.58 kg/m2  SpO2 259% Complications:  Patient admitted for cellulitis to left leg.  Wound nurse dressed leg on day shift.  She responded that she will return on Monday to take cultures from wound. Activity: Pt uses walker at home.  He is steady on feet but is a stand-by assist for safety due to pain in left leg.

## 2015-06-28 NOTE — Progress Notes (Signed)
DVT prophylaxis note - Per MD, pt with LLE cellulitis limiting use of TEDs and SCDs  Pt is walking around and hopefully will d/c soon  10/15: Plt 37 - no pharmacologic DVT ppx

## 2015-06-29 DIAGNOSIS — L03116 Cellulitis of left lower limb: Secondary | ICD-10-CM | POA: Diagnosis not present

## 2015-06-29 LAB — GLUCOSE, CAPILLARY
GLUCOSE-CAPILLARY: 246 mg/dL — AB (ref 65–99)
Glucose-Capillary: 164 mg/dL — ABNORMAL HIGH (ref 65–99)
Glucose-Capillary: 205 mg/dL — ABNORMAL HIGH (ref 65–99)
Glucose-Capillary: 346 mg/dL — ABNORMAL HIGH (ref 65–99)

## 2015-06-29 NOTE — Progress Notes (Signed)
Patient ID: [name], male   DOB: 1925/08/15, 81 y.o.   MRN: [id number] [facility] PROGRESS NOTE  PCP: Lelon Huh, MD  HPI/Subjective: Patient feeling okay. Less pain than yesterday in his leg. Does not have any pain in his abdomen today. Eating well.  Objective: Filed Vitals:   06/29/15 0831  BP: 154/65  Pulse: 62  Temp: 98 F (36.7 C)  Resp: 20    Filed Weights   06/27/15 1308 06/27/15 1310 06/27/15 1900  Weight: 88.905 kg (196 lb) 88.905 kg (196 lb) 88.905 kg (196 lb)    ROS: Review of Systems  Constitutional: Positive for weight loss. Negative for fever and chills.  Eyes: Negative for blurred vision.  Respiratory: Negative for cough and shortness of breath.   Cardiovascular: Negative for chest pain.  Gastrointestinal: Negative for nausea, vomiting, abdominal pain, diarrhea and constipation.  Genitourinary: Negative for dysuria.  Musculoskeletal: Negative for joint pain.  Neurological: Negative for dizziness and headaches.   Exam: Physical Exam  Constitutional: He is oriented to person, place, and time.  HENT:  Nose: No mucosal edema.  Mouth/Throat: No oropharyngeal exudate or posterior oropharyngeal edema.  Eyes: Conjunctivae, EOM and lids are normal. Pupils are equal, round, and reactive to light.  Neck: No JVD present. Carotid bruit is not present. No edema present. No thyroid mass and no thyromegaly present.  Cardiovascular: S1 normal and S2 normal.  Exam reveals no gallop.   No murmur heard. Pulses:      Dorsalis pedis pulses are 2+ on the right side, and 2+ on the left side.  Respiratory: No respiratory distress. He has no wheezes. He has no rhonchi. He has no rales.  GI: Soft. Bowel sounds are normal. There is no tenderness.  Musculoskeletal:       Right ankle: He exhibits swelling.       Left ankle: He exhibits swelling.  Lymphadenopathy:    He has no cervical adenopathy.  Neurological: He is alert and oriented to person, place,  and time. No cranial nerve deficit.  Skin: Skin is warm. Nails show no clubbing.  Erythema left lower extremity from underneath the knee down into the foot. Scab on the first toe on the left foot tip. Small superficial ulceration left shin which could be the source of the infection.  Psychiatric: He has a normal mood and affect.    Data Reviewed: Basic Metabolic Panel:  Recent Labs Lab 06/27/15 1604 06/28/15 0516  NA 135 134*  K 4.0 4.1  CL 94* 96*  CO2 32 33*  GLUCOSE 180* 123*  BUN 21* 19  CREATININE 1.43* 1.37*  CALCIUM 8.5* 8.2*   Liver Function Tests:  Recent Labs Lab 06/27/15 1604  AST 30  ALT 22  ALKPHOS 115  BILITOT 1.4*  PROT 6.2*  ALBUMIN 3.0*   CBC:  Recent Labs Lab 06/27/15 1604 06/28/15 0516  WBC 4.1 3.7*  HGB 12.5* 12.2*  HCT 38.3* 36.4*  MCV 96.3 96.3  PLT 39* 37*   CBG:  Recent Labs Lab 06/28/15 1142 06/28/15 1632 06/28/15 2122 06/29/15 0733 06/29/15 1124  GLUCAP 225* 171* 169* 164* 246*    Studies: US Venous Img Lower Unilateral Left  06/28/2015  CLINICAL DATA:  Swelling for 3 days. EXAM: LEFT LOWER EXTREMITY VENOUS DOPPLER ULTRASOUND TECHNIQUE: Gray-scale sonography with graded compression, as well as color Doppler and duplex ultrasound were performed to evaluate the lower extremity deep venous systems from the level of the common femoral vein and including the  common femoral, femoral, profunda femoral, popliteal and calf veins including the posterior tibial, peroneal and gastrocnemius veins when visible. The superficial great saphenous vein was also interrogated. Spectral Doppler was utilized to evaluate flow at rest and with distal augmentation maneuvers in the common femoral, femoral and popliteal veins. COMPARISON:  None. FINDINGS: Contralateral Common Femoral Vein: Respiratory phasicity is normal and symmetric with the symptomatic side. No evidence of thrombus. Normal compressibility. Common Femoral Vein: No evidence of thrombus.  Normal compressibility, respiratory phasicity and response to augmentation. Saphenofemoral Junction: No evidence of thrombus. Normal compressibility and flow on color Doppler imaging. Profunda Femoral Vein: No evidence of thrombus. Normal compressibility and flow on color Doppler imaging. Femoral Vein: No evidence of thrombus. Normal compressibility, respiratory phasicity and response to augmentation. Popliteal Vein: No evidence of thrombus. Normal compressibility, respiratory phasicity and response to augmentation. Calf Veins: No evidence of thrombus. Normal compressibility and flow on color Doppler imaging. Superficial Great Saphenous Vein: No evidence of thrombus. Normal compressibility and flow on color Doppler imaging. Venous Reflux:  None. Other Findings:  None. IMPRESSION: No evidence of left lower extremity deep venous thrombosis. Electronically Signed   By: Abigail Miyamoto M.D.   On: 06/28/2015 10:58    Scheduled Meds: . allopurinol  200 mg Oral q morning - 10a  . bumetanide  2 mg Oral Daily  . clopidogrel  75 mg Oral Daily  . famotidine  20 mg Oral Daily  . gabapentin  900 mg Oral QHS  . insulin aspart  0-5 Units Subcutaneous QHS  . insulin aspart  0-9 Units Subcutaneous TID WC  . levothyroxine  112 mcg Oral QAC breakfast  . lisinopril  5 mg Oral Daily  . montelukast  10 mg Oral QHS  . pravastatin  40 mg Oral Daily  . sodium chloride  10 mL Intravenous Q24H  . vancomycin  1,000 mg Intravenous Q24H    Assessment/Plan:  1. Left lower extremity cellulitis in an immunocompromised patient. Patient was on IV vancomycin. I will continue this. Ultrasound of the left lower extremity negative for blood clot. Reevaluate tomorrow morning for potential discharge. Cellulitis seems to be improving 2. Pancreatic cancer status post 2 cycles of chemotherapy. Some abdominal pain last night but none today. As needed pain medications. Follow up with oncology as outpatient. 3. Type 2 diabetes with  neuropathy- currently on sliding scale, gabapentin 4. Peripheral vascular disease on Plavix 5. Hypothyroidism unspecified continue levothyroxine 6. Essential hypertension- continue usual medications 7. Hyperlipidemia unspecified continue pravastatin. 8. Chronic kidney disease stage III.  Code Status:     Code Status Orders        Start     Ordered   06/27/15 1454  Full code   Continuous     06/27/15 1453    Advance Directive Documentation        Most Recent Value   Type of Advance Directive  Living will   Pre-existing out of facility DNR order (yellow form or pink MOST form)     "MOST" Form in Place?       Disposition Plan: Reevaluate tomorrow morning for potential discharge.  Antibiotics:  Vancomycin  Time spent: 22 minutes  Loletha Grayer  [facility]

## 2015-06-29 NOTE — Plan of Care (Signed)
Problem: Discharge Progression Outcomes Goal: Other Discharge Outcomes/Goals Outcome: Progressing  Cellulitis vancomycin IV therapy Pain well controlled with leg elevation and percocet PRN this shift.  Up to BR with SB+ with cane Tolerating diet well; eating well.   reddness and edema improved of LLE with current treatment.  Foam dsg intact on left lower leg. Dry, scabbed area on tip of left great toe left open to air

## 2015-06-29 NOTE — Plan of Care (Signed)
Problem: Discharge Progression Outcomes Goal: Other Discharge Outcomes/Goals Outcome: Progressing 1. Cellulitis education completed. 2. Pain controlled with percocet with 1 PRN dose given this shift thus far. 3. Up to BR with SB+; Keeps legs up in bed when sitting. 4. Tolerating dietl well. 5. Slight decreased margin of reddness; edema remains the same; dry open wound on tip of Left great toe. Foam dsg on left lower leg remains in place. Vancomycin IV continued. Afebrile. 6. Pt states hopes to discharge home soon with family support.

## 2015-06-30 ENCOUNTER — Telehealth: Payer: Self-pay | Admitting: Family Medicine

## 2015-06-30 DIAGNOSIS — L03116 Cellulitis of left lower limb: Secondary | ICD-10-CM | POA: Diagnosis not present

## 2015-06-30 LAB — CBC
HCT: 38.3 % — ABNORMAL LOW (ref 40.0–52.0)
Hemoglobin: 12.6 g/dL — ABNORMAL LOW (ref 13.0–18.0)
MCH: 31.4 pg (ref 26.0–34.0)
MCHC: 32.9 g/dL (ref 32.0–36.0)
MCV: 95.6 fL (ref 80.0–100.0)
Platelets: 69 10*3/uL — ABNORMAL LOW (ref 150–440)
RBC: 4 MIL/uL — ABNORMAL LOW (ref 4.40–5.90)
RDW: 16.4 % — AB (ref 11.5–14.5)
WBC: 4.4 10*3/uL (ref 3.8–10.6)

## 2015-06-30 LAB — BASIC METABOLIC PANEL
ANION GAP: 7 (ref 5–15)
BUN: 15 mg/dL (ref 6–20)
CALCIUM: 8.6 mg/dL — AB (ref 8.9–10.3)
CO2: 34 mmol/L — ABNORMAL HIGH (ref 22–32)
Chloride: 94 mmol/L — ABNORMAL LOW (ref 101–111)
Creatinine, Ser: 1.27 mg/dL — ABNORMAL HIGH (ref 0.61–1.24)
GFR calc Af Amer: 56 mL/min — ABNORMAL LOW (ref >60)
GFR, EST NON AFRICAN AMERICAN: 49 mL/min — AB (ref >60)
GLUCOSE: 196 mg/dL — AB (ref 65–99)
Potassium: 3.8 mmol/L (ref 3.5–5.1)
Sodium: 135 mmol/L (ref 135–145)

## 2015-06-30 LAB — GLUCOSE, CAPILLARY: GLUCOSE-CAPILLARY: 195 mg/dL — AB (ref 65–99)

## 2015-06-30 MED ORDER — DOXYCYCLINE HYCLATE 100 MG PO TABS: 100.0000 mg | ORAL_TABLET | Freq: Two times a day (BID) | ORAL | Status: DC

## 2015-06-30 MED ORDER — HEPARIN SOD (PORK) LOCK FLUSH 100 UNIT/ML IV SOLN
500.0000 [IU] | Freq: Once | INTRAVENOUS | Status: AC
Start: 2015-06-30 — End: 2015-06-30
  Administered 2015-06-30: 09:00:00 500 [IU] via INTRAVENOUS
  Filled 2015-06-30: qty 5

## 2015-06-30 MED ORDER — OXYCODONE-ACETAMINOPHEN 5-325 MG PO TABS: 1.0000 | ORAL_TABLET | Freq: Three times a day (TID) | ORAL | Status: DC | PRN

## 2015-06-30 NOTE — Telephone Encounter (Signed)
Pt contacted office for refill request on the following medications:  oxyCODONE-acetaminophen (PERCOCET/ROXICET) 5-325 MG.  YH#[phone]/DV

## 2015-06-30 NOTE — Consult Note (Signed)
WOC wound follow up Wound type:Cellulitis to left anterior pretibial region, resolving.  To discharge today.  Wound bed is 25% slough now.  Erythema and edema is resolving and patient indicates it feels better.  Measurement:3 cm x 2 cm x 0.1 cm  Wound bed:25% slough 75% ruddy red Drainage (amount, consistency, odor) Minimal serosanguinous drainage.  Periwound:Resolving erythema and edema Dressing procedure/placement/frequency:Silicone foam dressing has promoted autolysis and wound is 75% clean today.  Son and patient instructed to apply Bactroban daily with dry dressing and follow up as ordered with MD.  Both verbalize understanding.  Dr Leslye Peer notified and agrees with the wound care plan.  Will not follow at this time.  Please re-consult if needed.  Domenic Moras RN BSN [place] Pager [phone]

## 2015-06-30 NOTE — Progress Notes (Signed)
Pt being discharged home, discharge and prescription reviewed with pt and son states understanding, instructed to apply Bactroban ointment to LLE wound and cover with a bandage daily per wound nurse, aware to follow up with Dr Caryn Section states understanding, pt with no noted complaints at discharge

## 2015-06-30 NOTE — Discharge Summary (Signed)
[place] [facility] [place] NAME: [name]    MR#:  [id number]  DATE OF BIRTH:  March 24, 1925  DATE OF ADMISSION:  06/27/2015 ADMITTING PHYSICIAN: Birdie Sons, MD  DATE OF DISCHARGE: 06/30/2015  PRIMARY CARE PHYSICIAN: Lelon Huh, MD    ADMISSION DIAGNOSIS:  Cellulitis  DISCHARGE DIAGNOSIS:  Active Problems:   Cellulitis   SECONDARY DIAGNOSIS:   Past Medical History  Diagnosis Date  . Diabetes mellitus without complication ([place])   . Hypertension   . PAD (peripheral artery disease) ([place])   . Gout   . Hypothyroidism   . History of chicken pox   . History of measles as a child   . History of mumps as a child   . SSS (sick sinus syndrome) ([place])   . Chronic kidney disease     ckd  . GERD (gastroesophageal reflux disease)   . Neuropathy ([place])   . Dysrhythmia     a fib  . Cancer [facility])     Skin cancer  . Pancreatic mass 04/2015    scheduled for biopsy on 05/08/2015  . Presence of permanent cardiac pacemaker     HOSPITAL COURSE:   1. Left lower extremity cellulitis. Patient was started on IV vancomycin on admission and kept on that. Cellulitis of started to improve. He is having less pain. I will switch over to oral doxycycline upon discharge. Follow up with PMD in 1 week. Elevate leg. Sonogram left lower extremity negative for DVT. Patient afebrile and white count normal. 2. Pancreatic cancer. Occasional abdominal pain. Has when necessary medication. 3. Essential hypertension stable on meds 4. Type 2 diabetes- able to go back on usual medications at home. 5. Hypothyroidism unspecified continue levothyroxine  DISCHARGE CONDITIONS:   Satisfactory  CONSULTS OBTAINED:  None  DRUG ALLERGIES:   Allergies  Allergen Reactions  . Adhesive [Tape] Other (See Comments)    Patient states that adhesive tape causes his skin to tear very easily and asks for paper tape ONLY  . Crestor [Rosuvastatin Calcium] Rash  .  Penicillins Rash    DISCHARGE MEDICATIONS:   Current Discharge Medication List    START taking these medications   Details  doxycycline (VIBRA-TABS) 100 MG tablet Take 1 tablet (100 mg total) by mouth every 12 (twelve) hours. Qty: 18 tablet, Refills: 0      CONTINUE these medications which have NOT CHANGED   Details  allopurinol (ZYLOPRIM) 100 MG tablet TAKE 2 TABLETS BY MOUTH EVERY MORNING Qty: 60 tablet, Refills: 11    bumetanide (BUMEX) 2 MG tablet Take 2 mg by mouth daily. 1 or 2 tablets AS NEEDED    clopidogrel (PLAVIX) 75 MG tablet     gabapentin (NEURONTIN) 300 MG capsule Take 3 capsules (900 mg total) by mouth at bedtime. Qty: 90 capsule, Refills: 5    Garlic 1324 MG CAPS Take by mouth daily.    glipiZIDE (GLUCOTROL) 5 MG tablet Take by mouth 2 (two) times daily before a meal.    Insulin Glargine (TOUJEO SOLOSTAR) 300 UNIT/ML SOPN Inject 10 Units into the skin daily.    levothyroxine (SYNTHROID, LEVOTHROID) 112 MCG tablet Take 112 mcg by mouth daily before breakfast.    lidocaine-prilocaine (EMLA) cream Apply 1 application topically as needed. Apply to port then cover with saran wrap 1-2 hours before chemotherapy appointment Qty: 30 g, Refills: 2    lisinopril (PRINIVIL,ZESTRIL) 10 MG tablet Take 5 mg by mouth daily.  montelukast (SINGULAIR) 10 MG tablet Take 10 mg by mouth at bedtime.    Omega-3 Fatty Acids (FISH OIL) 1000 MG CAPS Take by mouth 2 (two) times daily.    oxyCODONE-acetaminophen (PERCOCET/ROXICET) 5-325 MG tablet Take 1 tablet by mouth every 8 (eight) hours as needed for moderate pain. Qty: 30 tablet, Refills: 0    pravastatin (PRAVACHOL) 40 MG tablet Take 40 mg by mouth daily.    prochlorperazine (COMPAZINE) 10 MG tablet Take 1 tablet (10 mg total) by mouth every 6 (six) hours as needed for nausea or vomiting. Qty: 30 tablet, Refills: 2    ranitidine (ZANTAC) 150 MG tablet Take 150 mg by mouth daily. 1-2 tabs daily    silver sulfADIAZINE  (SILVADENE) 1 % cream Apply 1 application topically 2 (two) times daily. to affected area    vitamin B-12 (CYANOCOBALAMIN) 1000 MCG tablet Inject 1,000 mcg into the muscle every 30 (thirty) days.         DISCHARGE INSTRUCTIONS:   Follow-up PMD one week  If you experience worsening of your admission symptoms, develop shortness of breath, life threatening emergency, suicidal or homicidal thoughts you must seek medical attention immediately by calling 911 or calling your MD immediately  if symptoms less severe.  You Must read complete instructions/literature along with all the possible adverse reactions/side effects for all the Medicines you take and that have been prescribed to you. Take any new Medicines after you have completely understood and accept all the possible adverse reactions/side effects.   Please note  You were cared for by a hospitalist during your hospital stay. If you have any questions about your discharge medications or the care you received while you were in the hospital after you are discharged, you can call the unit and asked to speak with the hospitalist on call if the hospitalist that took care of you is not available. Once you are discharged, your primary care physician will handle any further medical issues. Please note that NO REFILLS for any discharge medications will be authorized once you are discharged, as it is imperative that you return to your primary care physician (or establish a relationship with a primary care physician if you do not have one) for your aftercare needs so that they can reassess your need for medications and monitor your lab values.    Today   CHIEF COMPLAINT:  No chief complaint on file.   HISTORY OF PRESENT ILLNESS:  [name]  is a 81 y.o. male with a known history of pancreatic cancer. Patient presented with left lower extremity pain and found to have a cellulitis.   VITAL SIGNS:  Blood pressure 142/63, pulse 61, temperature  97.8 F (36.6 C), temperature source Oral, resp. rate 18, height 6' (1.829 m), weight 88.905 kg (196 lb), SpO2 94 %.  I/O:    PHYSICAL EXAMINATION:  GENERAL:  81 y.o.-year-old patient lying in the bed with no acute distress.  EYES: Pupils equal, round, reactive to light and accommodation. No scleral icterus. Extraocular muscles intact.  HEENT: Head atraumatic, normocephalic. Oropharynx and nasopharynx clear.  NECK:  Supple, no jugular venous distention. No thyroid enlargement, no tenderness.  LUNGS: Normal breath sounds bilaterally, no wheezing, rales,rhonchi or crepitation. No use of accessory muscles of respiration.  CARDIOVASCULAR: S1, S2 normal. No murmurs, rubs, or gallops.  ABDOMEN: Soft, non-tender, non-distended. Bowel sounds present. No organomegaly or mass.  EXTREMITIES: No pedal edema, cyanosis, or clubbing.  NEUROLOGIC: Cranial nerves II through XII are intact. Muscle strength 5/5  in all extremities. Sensation intact. Gait not checked.  PSYCHIATRIC: The patient is alert and oriented x 3.  SKIN: Left leg erythema starting to improve especially at the top. Still some slight redness in the foot and shin. Does have a small skin tear on the anterior shin. Which could be the source of infection.  DATA REVIEW:   CBC  Recent Labs Lab 06/30/15 0555  WBC 4.4  HGB 12.6*  HCT 38.3*  PLT 69*    Chemistries   Recent Labs Lab 06/27/15 1604  06/30/15 0555  NA 135  < > 135  K 4.0  < > 3.8  CL 94*  < > 94*  CO2 32  < > 34*  GLUCOSE 180*  < > 196*  BUN 21*  < > 15  CREATININE 1.43*  < > 1.27*  CALCIUM 8.5*  < > 8.6*  AST 30  --   --   ALT 22  --   --   ALKPHOS 115  --   --   BILITOT 1.4*  --   --   < > = values in this interval not displayed.   Microbiology Results  Results for orders placed or performed during the hospital encounter of 01/29/15  Wound culture     Status: None   Collection Time: 01/29/15  3:33 PM  Result Value Ref Range Status   Specimen Description  WOUND  Final   Special Requests NONE  Final   Gram Stain RARE WBC SEEN NO ORGANISMS SEEN   Final   Culture RARE CANDIDA PARAPSILOSIS  Final   Report Status 02/04/2015 FINAL  Final    RADIOLOGY:  US Venous Img Lower Unilateral Left  06/28/2015  CLINICAL DATA:  Swelling for 3 days. EXAM: LEFT LOWER EXTREMITY VENOUS DOPPLER ULTRASOUND TECHNIQUE: Gray-scale sonography with graded compression, as well as color Doppler and duplex ultrasound were performed to evaluate the lower extremity deep venous systems from the level of the common femoral vein and including the common femoral, femoral, profunda femoral, popliteal and calf veins including the posterior tibial, peroneal and gastrocnemius veins when visible. The superficial great saphenous vein was also interrogated. Spectral Doppler was utilized to evaluate flow at rest and with distal augmentation maneuvers in the common femoral, femoral and popliteal veins. COMPARISON:  None. FINDINGS: Contralateral Common Femoral Vein: Respiratory phasicity is normal and symmetric with the symptomatic side. No evidence of thrombus. Normal compressibility. Common Femoral Vein: No evidence of thrombus. Normal compressibility, respiratory phasicity and response to augmentation. Saphenofemoral Junction: No evidence of thrombus. Normal compressibility and flow on color Doppler imaging. Profunda Femoral Vein: No evidence of thrombus. Normal compressibility and flow on color Doppler imaging. Femoral Vein: No evidence of thrombus. Normal compressibility, respiratory phasicity and response to augmentation. Popliteal Vein: No evidence of thrombus. Normal compressibility, respiratory phasicity and response to augmentation. Calf Veins: No evidence of thrombus. Normal compressibility and flow on color Doppler imaging. Superficial Great Saphenous Vein: No evidence of thrombus. Normal compressibility and flow on color Doppler imaging. Venous Reflux:  None. Other Findings:  None.  IMPRESSION: No evidence of left lower extremity deep venous thrombosis. Electronically Signed   By: Abigail Miyamoto M.D.   On: 06/28/2015 10:58    Management plans discussed with the patient, and he is in agreement.  CODE STATUS:     Code Status Orders        Start     Ordered   06/27/15 1454  Full code   Continuous  06/27/15 1453    Advance Directive Documentation        Most Recent Value   Type of Advance Directive  Living will   Pre-existing out of facility DNR order (yellow form or pink MOST form)     "MOST" Form in Place?        TOTAL TIME TAKING CARE OF THIS PATIENT: 35 minutes.    Loletha Grayer M.D on 06/30/2015 at 8:34 AM  Between 7am to 6pm - Pager - [phone]  After 6pm go to [email or web address] - password [email or web address] [place] [facility]  Office  [phone]  CC: Primary care physician; Lelon Huh, MD

## 2015-06-30 NOTE — Telephone Encounter (Signed)
Pt states the hospital advised him to pick up Bactroban ointment over the counter for a wound on his leg.  Pt states he can not get this over the counter and is needing a Rx for this.  [name]

## 2015-07-01 ENCOUNTER — Ambulatory Visit: Payer: Self-pay | Admitting: Surgery

## 2015-07-01 ENCOUNTER — Other Ambulatory Visit: Payer: Self-pay | Admitting: Family Medicine

## 2015-07-01 MED ORDER — MUPIROCIN CALCIUM 2 % EX CREA: 1.0000 "application " | TOPICAL_CREAM | Freq: Two times a day (BID) | CUTANEOUS | Status: DC

## 2015-07-01 NOTE — Telephone Encounter (Signed)
Rx has been sent to [facility].  

## 2015-07-01 NOTE — Telephone Encounter (Signed)
06/30/2015 Pt states the hospital advised him to pick up Bactroban ointment over the counter for a wound on his leg. Pt states he can not get this over the counter and is needing a Rx for this. Corn states the pharmacy, [facility] has not rec'd a Rx for [employer]

## 2015-07-01 NOTE — Telephone Encounter (Signed)
Patient notified

## 2015-07-02 ENCOUNTER — Telehealth: Payer: Self-pay | Admitting: *Deleted

## 2015-07-02 NOTE — Telephone Encounter (Signed)
Has chemo appt tomorrow and just got out hospital for infected leg. Asking if he should keep appt. Per Dr Grayland Ormond, he will decide tomorrow when he sees him and pt said he will be here at 9 then

## 2015-07-03 ENCOUNTER — Inpatient Hospital Stay (HOSPITAL_BASED_OUTPATIENT_CLINIC_OR_DEPARTMENT_OTHER): Payer: Medicare Other | Admitting: Oncology

## 2015-07-03 ENCOUNTER — Inpatient Hospital Stay: Payer: Medicare Other

## 2015-07-03 VITALS — BP 96/57 | HR 63 | Temp 97.0°F | Resp 20 | Wt 191.0 lb

## 2015-07-03 DIAGNOSIS — I499 Cardiac arrhythmia, unspecified: Secondary | ICD-10-CM

## 2015-07-03 DIAGNOSIS — R5383 Other fatigue: Secondary | ICD-10-CM | POA: Diagnosis not present

## 2015-07-03 DIAGNOSIS — Z85828 Personal history of other malignant neoplasm of skin: Secondary | ICD-10-CM

## 2015-07-03 DIAGNOSIS — I10 Essential (primary) hypertension: Secondary | ICD-10-CM

## 2015-07-03 DIAGNOSIS — C259 Malignant neoplasm of pancreas, unspecified: Secondary | ICD-10-CM | POA: Diagnosis not present

## 2015-07-03 DIAGNOSIS — Z79899 Other long term (current) drug therapy: Secondary | ICD-10-CM

## 2015-07-03 DIAGNOSIS — Z950 Presence of cardiac pacemaker: Secondary | ICD-10-CM

## 2015-07-03 DIAGNOSIS — Z8639 Personal history of other endocrine, nutritional and metabolic disease: Secondary | ICD-10-CM

## 2015-07-03 DIAGNOSIS — L03116 Cellulitis of left lower limb: Secondary | ICD-10-CM | POA: Diagnosis not present

## 2015-07-03 DIAGNOSIS — G629 Polyneuropathy, unspecified: Secondary | ICD-10-CM

## 2015-07-03 DIAGNOSIS — I739 Peripheral vascular disease, unspecified: Secondary | ICD-10-CM

## 2015-07-03 DIAGNOSIS — I495 Sick sinus syndrome: Secondary | ICD-10-CM

## 2015-07-03 DIAGNOSIS — E039 Hypothyroidism, unspecified: Secondary | ICD-10-CM

## 2015-07-03 DIAGNOSIS — Z794 Long term (current) use of insulin: Secondary | ICD-10-CM

## 2015-07-03 DIAGNOSIS — R944 Abnormal results of kidney function studies: Secondary | ICD-10-CM

## 2015-07-03 DIAGNOSIS — C252 Malignant neoplasm of tail of pancreas: Secondary | ICD-10-CM

## 2015-07-03 DIAGNOSIS — I129 Hypertensive chronic kidney disease with stage 1 through stage 4 chronic kidney disease, or unspecified chronic kidney disease: Secondary | ICD-10-CM

## 2015-07-03 DIAGNOSIS — I4891 Unspecified atrial fibrillation: Secondary | ICD-10-CM

## 2015-07-03 DIAGNOSIS — E119 Type 2 diabetes mellitus without complications: Secondary | ICD-10-CM

## 2015-07-03 DIAGNOSIS — N189 Chronic kidney disease, unspecified: Secondary | ICD-10-CM

## 2015-07-03 DIAGNOSIS — R531 Weakness: Secondary | ICD-10-CM

## 2015-07-03 LAB — CBC WITH DIFFERENTIAL/PLATELET
Basophils Absolute: 0 10*3/uL (ref 0–0.1)
Basophils Relative: 1 %
EOS ABS: 0.1 10*3/uL (ref 0–0.7)
EOS PCT: 2 %
HCT: 43.8 % (ref 40.0–52.0)
HEMOGLOBIN: 14.3 g/dL (ref 13.0–18.0)
LYMPHS ABS: 0.5 10*3/uL — AB (ref 1.0–3.6)
LYMPHS PCT: 11 %
MCH: 31.6 pg (ref 26.0–34.0)
MCHC: 32.6 g/dL (ref 32.0–36.0)
MCV: 97.1 fL (ref 80.0–100.0)
MONOS PCT: 13 %
Monocytes Absolute: 0.6 10*3/uL (ref 0.2–1.0)
NEUTROS PCT: 73 %
Neutro Abs: 3.7 10*3/uL (ref 1.4–6.5)
Platelets: 181 10*3/uL (ref 150–440)
RBC: 4.51 MIL/uL (ref 4.40–5.90)
RDW: 17.9 % — ABNORMAL HIGH (ref 11.5–14.5)
WBC: 5 10*3/uL (ref 3.8–10.6)

## 2015-07-03 LAB — COMPREHENSIVE METABOLIC PANEL
ALK PHOS: 121 U/L (ref 38–126)
ALT: 37 U/L (ref 17–63)
ANION GAP: 8 (ref 5–15)
AST: 63 U/L — ABNORMAL HIGH (ref 15–41)
Albumin: 3.5 g/dL (ref 3.5–5.0)
BILIRUBIN TOTAL: 1.4 mg/dL — AB (ref 0.3–1.2)
BUN: 20 mg/dL (ref 6–20)
CALCIUM: 8.2 mg/dL — AB (ref 8.9–10.3)
CO2: 33 mmol/L — ABNORMAL HIGH (ref 22–32)
CREATININE: 1.72 mg/dL — AB (ref 0.61–1.24)
Chloride: 94 mmol/L — ABNORMAL LOW (ref 101–111)
GFR, EST AFRICAN AMERICAN: 39 mL/min — AB (ref >60)
GFR, EST NON AFRICAN AMERICAN: 34 mL/min — AB (ref >60)
Glucose, Bld: 180 mg/dL — ABNORMAL HIGH (ref 65–99)
Potassium: 3.9 mmol/L (ref 3.5–5.1)
SODIUM: 135 mmol/L (ref 135–145)
TOTAL PROTEIN: 6.6 g/dL (ref 6.5–8.1)

## 2015-07-03 MED ORDER — SODIUM CHLORIDE 0.9 % IV SOLN
Freq: Once | INTRAVENOUS | Status: AC
  Administered 2015-07-03: 11:00:00 via INTRAVENOUS
  Filled 2015-07-03: qty 1000

## 2015-07-03 MED ORDER — HEPARIN SOD (PORK) LOCK FLUSH 100 UNIT/ML IV SOLN
500.0000 [IU] | Freq: Once | INTRAVENOUS | Status: AC | PRN
  Administered 2015-07-03: 500 [IU]
  Filled 2015-07-03: qty 5

## 2015-07-03 MED ORDER — PACLITAXEL PROTEIN-BOUND CHEMO INJECTION 100 MG
100.0000 mg/m2 | Freq: Once | INTRAVENOUS | Status: AC
  Administered 2015-07-03: 200 mg via INTRAVENOUS
  Filled 2015-07-03: qty 40

## 2015-07-03 MED ORDER — SODIUM CHLORIDE 0.9 % IV SOLN
1600.0000 mg | Freq: Once | INTRAVENOUS | Status: AC
  Administered 2015-07-03: 1600 mg via INTRAVENOUS
  Filled 2015-07-03: qty 36.82

## 2015-07-03 MED ORDER — SODIUM CHLORIDE 0.9 % IJ SOLN
10.0000 mL | INTRAMUSCULAR | Status: DC | PRN
  Administered 2015-07-03: 10 mL
  Filled 2015-07-03: qty 10

## 2015-07-03 MED ORDER — SODIUM CHLORIDE 0.9 % IV SOLN
Freq: Once | INTRAVENOUS | Status: AC
  Administered 2015-07-03: 11:00:00 via INTRAVENOUS
  Filled 2015-07-03: qty 4

## 2015-07-03 NOTE — Progress Notes (Signed)
Creatinine 1.72. Verified with Dr Grayland Ormond that pt is okay for treatment.

## 2015-07-03 NOTE — Progress Notes (Signed)
Patient here today for ongoing follow up and treatment consideration regarding pancreatic cancer. Patient reports being hospitalized last week for skin infection, is currently on Doxycycline.

## 2015-07-04 ENCOUNTER — Encounter: Payer: Self-pay | Admitting: Family Medicine

## 2015-07-04 ENCOUNTER — Ambulatory Visit (INDEPENDENT_AMBULATORY_CARE_PROVIDER_SITE_OTHER): Payer: Medicare Other | Admitting: Family Medicine

## 2015-07-04 VITALS — BP 112/56 | HR 72 | Temp 98.1°F | Resp 16

## 2015-07-04 DIAGNOSIS — E1149 Type 2 diabetes mellitus with other diabetic neurological complication: Secondary | ICD-10-CM | POA: Diagnosis not present

## 2015-07-04 DIAGNOSIS — IMO0002 Reserved for concepts with insufficient information to code with codable children: Secondary | ICD-10-CM

## 2015-07-04 DIAGNOSIS — E1165 Type 2 diabetes mellitus with hyperglycemia: Secondary | ICD-10-CM

## 2015-07-04 DIAGNOSIS — L02419 Cutaneous abscess of limb, unspecified: Secondary | ICD-10-CM

## 2015-07-04 DIAGNOSIS — I70242 Atherosclerosis of native arteries of left leg with ulceration of calf: Secondary | ICD-10-CM

## 2015-07-04 DIAGNOSIS — L03119 Cellulitis of unspecified part of limb: Secondary | ICD-10-CM | POA: Diagnosis not present

## 2015-07-04 LAB — WOUND CULTURE: Culture: NO GROWTH

## 2015-07-04 MED ORDER — OXYCODONE-ACETAMINOPHEN 5-325 MG PO TABS: 1.0000 | ORAL_TABLET | Freq: Three times a day (TID) | ORAL | Status: DC | PRN

## 2015-07-04 MED ORDER — MUPIROCIN 2 % EX OINT: 1.0000 "application " | TOPICAL_OINTMENT | Freq: Two times a day (BID) | CUTANEOUS | Status: DC

## 2015-07-04 NOTE — Progress Notes (Signed)
[place]  Telephone:(336) [phone] Fax:(336) [phone]  ID: [name] OB: 1925-04-25  MR#: [id number]  QIO#:[id number]  [name], MD as PCP - General (Family Medicine) [name], MD as Referring Physician (Vascular Surgery) [name], MD as Consulting Physician (Cardiology) [name], MD as Consulting Physician (Hematology and Oncology) Clent Jacks, RN as Registered Nurse  CHIEF COMPLAINT:  Chief Complaint  Patient presents with  . Pancreatic Cancer    follow up  . Chemotherapy    INTERVAL HISTORY: Patient returns to clinic today for further evaluation and consideration of cycle 2, day 1 of gemcitabine and Abraxane. He was admitted to the hospital last week for a left lower extremity cellulitis requiring IV antibiotics. He is nearing the end of his course of his antibiotics, and feels improved and back to his baseline. He currently feels well and is asymptomatic. He has no neurologic complaints. He denies any recent fevers. He denies any chest pain or shortness of breath.  He has a good appetite and denies any nausea, vomiting, constipation, or diarrhea. He has no changes in his bowel movements and denies any melanotic or hematochezia. He has no urinary complaints. Patient offers no further specific complaints today.  REVIEW OF SYSTEMS:   Review of Systems  Constitutional: Positive for malaise/fatigue. Negative for weight loss.  Respiratory: Negative.   Cardiovascular: Negative.   Gastrointestinal: Negative for abdominal pain.  Neurological: Positive for weakness.  Endo/Heme/Allergies: Does not bruise/bleed easily.    As per HPI. Otherwise, a complete review of systems is negatve.  PAST MEDICAL HISTORY: Past Medical History  Diagnosis Date  . Diabetes mellitus without complication ([place])   . Hypertension   . PAD (peripheral artery disease) ([place])   . Gout   . Hypothyroidism   . History of chicken pox   .  History of measles as a child   . History of mumps as a child   . SSS (sick sinus syndrome) ([place])   . Chronic kidney disease     ckd  . GERD (gastroesophageal reflux disease)   . Neuropathy ([place])   . Dysrhythmia     a fib  . Cancer [facility])     Skin cancer  . Pancreatic mass 04/2015    scheduled for biopsy on 05/08/2015  . Presence of permanent cardiac pacemaker     PAST SURGICAL HISTORY: Past Surgical History  Procedure Laterality Date  . Carotid endarterectomy Right 2015    Dr. Lucky Cowboy  . Heart stents  2015    one stent per patient  . Bilateral leg stents Bilateral 2015    Bilateral iliac and right popliteal  . Skin cancer excision    . Cataract extraction Bilateral 09/2013  . Angioplasty  2009, 2010    done by Dr. Hulda Humphrey and Dr. Dian Situ  . Eye surgery    . Coronary angioplasty  2015  . Appendectomy  1962  . Pacemaker insertion N/A 04/30/2015    Procedure: INSERTION PACEMAKER;  Surgeon: Isaias Cowman, MD;  Location: [facility] ORS;  Service: Cardiovascular;  Laterality: N/A;  . Upper esophageal endoscopic ultrasound (eus) N/A 05/08/2015    Procedure: UPPER ESOPHAGEAL ENDOSCOPIC ULTRASOUND (EUS);  Surgeon: Cora Daniels, MD;  Location: [facility] ENDOSCOPY;  Service: Endoscopy;  Laterality: N/A;  . Peripheral vascular catheterization N/A 06/04/2015    Procedure: Porta Cath Insertion;  Surgeon: [name], MD;  Location: [place] CV LAB;  Service: Cardiovascular;  Laterality: N/A;  FAMILY HISTORY Family History  Problem Relation Age of Onset  . Diabetes Mother   . Cancer Sister     Uterine  . Arthritis Father   . Kidney disease Father        ADVANCED DIRECTIVES:    HEALTH MAINTENANCE: Social History  Substance Use Topics  . Smoking status: Never Smoker   . Smokeless tobacco: Never Used  . Alcohol Use: No     Colonoscopy:  PAP:  Bone density:  Lipid panel:  Allergies  Allergen Reactions  . Adhesive [Tape] Other (See Comments)    Patient states that  adhesive tape causes his skin to tear very easily and asks for paper tape ONLY  . Crestor [Rosuvastatin Calcium] Rash  . Penicillins Rash    Current Outpatient Prescriptions  Medication Sig Dispense Refill  . allopurinol (ZYLOPRIM) 100 MG tablet TAKE 2 TABLETS BY MOUTH EVERY MORNING 60 tablet 11  . bumetanide (BUMEX) 2 MG tablet Take 2 mg by mouth daily. 1 or 2 tablets AS NEEDED    . clopidogrel (PLAVIX) 75 MG tablet Take 1 tablet by mouth  daily 90 tablet 1  . doxycycline (VIBRA-TABS) 100 MG tablet Take 1 tablet (100 mg total) by mouth every 12 (twelve) hours. 18 tablet 0  . gabapentin (NEURONTIN) 300 MG capsule Take 3 capsules by mouth at bedtime 270 capsule 1  . Garlic 5631 MG CAPS Take by mouth daily.    Marland Kitchen glipiZIDE (GLUCOTROL) 5 MG tablet Take by mouth 2 (two) times daily before a meal.    . Insulin Glargine (TOUJEO SOLOSTAR) 300 UNIT/ML SOPN Inject 10 Units into the skin daily.    Marland Kitchen levothyroxine (SYNTHROID, LEVOTHROID) 112 MCG tablet Take 112 mcg by mouth daily before breakfast.    . lidocaine-prilocaine (EMLA) cream Apply 1 application topically as needed. Apply to port then cover with saran wrap 1-2 hours before chemotherapy appointment 30 g 2  . lisinopril (PRINIVIL,ZESTRIL) 10 MG tablet Take 5 mg by mouth daily.     . montelukast (SINGULAIR) 10 MG tablet Take 10 mg by mouth at bedtime.    . mupirocin cream (BACTROBAN) 2 % Apply 1 application topically 2 (two) times daily. 15 g 0  . Omega-3 Fatty Acids (FISH OIL) 1000 MG CAPS Take by mouth 2 (two) times daily.    . pravastatin (PRAVACHOL) 40 MG tablet Take 40 mg by mouth daily.    . prochlorperazine (COMPAZINE) 10 MG tablet Take 1 tablet (10 mg total) by mouth every 6 (six) hours as needed for nausea or vomiting. 30 tablet 2  . ranitidine (ZANTAC) 150 MG tablet Take 150 mg by mouth daily. 1-2 tabs daily    . vitamin B-12 (CYANOCOBALAMIN) 1000 MCG tablet Inject 1,000 mcg into the muscle every 30 (thirty) days.    . mupirocin  ointment (BACTROBAN) 2 % Place 1 application into the nose 2 (two) times daily. 22 g 0  . oxyCODONE-acetaminophen (PERCOCET/ROXICET) 5-325 MG tablet Take 1 tablet by mouth every 8 (eight) hours as needed for moderate pain. 30 tablet 0  . silver sulfADIAZINE (SILVADENE) 1 % cream Apply 1 application topically 2 (two) times daily. to affected area     No current facility-administered medications for this visit.   Facility-Administered Medications Ordered in Other Visits  Medication Dose Route Frequency Provider Last Rate Last Dose  . sodium chloride 0.9 % injection 10 mL  10 mL Intravenous PRN Lloyd Huger, MD      . sodium chloride 0.9 % injection  10 mL  10 mL Intracatheter PRN Lloyd Huger, MD   10 mL at 06/13/15 0919    OBJECTIVE: Filed Vitals:   07/03/15 1049  BP: 96/57  Pulse: 63  Temp: 97 F (36.1 C)  Resp: 20     Body mass index is 25.9 kg/(m^2).    ECOG FS:1 - Symptomatic but completely ambulatory  General: Well-developed, well-nourished, no acute distress. Eyes: Pink conjunctiva, anicteric sclera. Lungs: Clear to auscultation bilaterally. Heart: Regular rate and rhythm. No rubs, murmurs, or gallops. Abdomen: Soft, nontender, nondistended. No organomegaly noted, normoactive bowel sounds. Musculoskeletal: No edema, cyanosis, or clubbing. Neuro: Alert, answering all questions appropriately. Cranial nerves grossly intact. Skin: Left lower leg erythema, improved. Psych: Normal affect.  LAB RESULTS:  Lab Results  Component Value Date   NA 135 07/03/2015   K 3.9 07/03/2015   CL 94* 07/03/2015   CO2 33* 07/03/2015   GLUCOSE 180* 07/03/2015   BUN 20 07/03/2015   CREATININE 1.72* 07/03/2015   CALCIUM 8.2* 07/03/2015   PROT 6.6 07/03/2015   ALBUMIN 3.5 07/03/2015   AST 63* 07/03/2015   ALT 37 07/03/2015   ALKPHOS 121 07/03/2015   BILITOT 1.4* 07/03/2015   GFRNONAA 34* 07/03/2015   GFRAA 39* 07/03/2015    Lab Results  Component Value Date   WBC 5.0  07/03/2015   NEUTROABS 3.7 07/03/2015   HGB 14.3 07/03/2015   HCT 43.8 07/03/2015   MCV 97.1 07/03/2015   PLT 181 07/03/2015     STUDIES: US Venous Img Lower Unilateral Left  06/28/2015  CLINICAL DATA:  Swelling for 3 days. EXAM: LEFT LOWER EXTREMITY VENOUS DOPPLER ULTRASOUND TECHNIQUE: Gray-scale sonography with graded compression, as well as color Doppler and duplex ultrasound were performed to evaluate the lower extremity deep venous systems from the level of the common femoral vein and including the common femoral, femoral, profunda femoral, popliteal and calf veins including the posterior tibial, peroneal and gastrocnemius veins when visible. The superficial great saphenous vein was also interrogated. Spectral Doppler was utilized to evaluate flow at rest and with distal augmentation maneuvers in the common femoral, femoral and popliteal veins. COMPARISON:  None. FINDINGS: Contralateral Common Femoral Vein: Respiratory phasicity is normal and symmetric with the symptomatic side. No evidence of thrombus. Normal compressibility. Common Femoral Vein: No evidence of thrombus. Normal compressibility, respiratory phasicity and response to augmentation. Saphenofemoral Junction: No evidence of thrombus. Normal compressibility and flow on color Doppler imaging. Profunda Femoral Vein: No evidence of thrombus. Normal compressibility and flow on color Doppler imaging. Femoral Vein: No evidence of thrombus. Normal compressibility, respiratory phasicity and response to augmentation. Popliteal Vein: No evidence of thrombus. Normal compressibility, respiratory phasicity and response to augmentation. Calf Veins: No evidence of thrombus. Normal compressibility and flow on color Doppler imaging. Superficial Great Saphenous Vein: No evidence of thrombus. Normal compressibility and flow on color Doppler imaging. Venous Reflux:  None. Other Findings:  None. IMPRESSION: No evidence of left lower extremity deep venous  thrombosis. Electronically Signed   By: Abigail Miyamoto M.D.   On: 06/28/2015 10:58    ASSESSMENT: Stage III adenocarcinoma of the pancreas.  PLAN:    1. Pancreatic mass: PET scan results reviewed independently. CA-19-9 is elevated at 315.  EUS confirmed pancreatic adenocarcinoma. Patient is not a surgical candidate.  Proceed with cycle 2, day 1 of gemcitabine and Abraxane. Return to clinic in 2 weeks for consideration of cycle 2, day 15. Patient can only tolerate treatments on days 1 and 15.  Will reimage after ~3 cycles.  2. Pain: Continue fentanyl patch and oxycodone as needed.  3. Thrombocytopenia: Resolved.  Secondary to chemotherapy, monitor. 4. Elevated creatinine: Approximately patient's baseline, monitor. 5. Cellulitis: Continue doxycycline as prescribed.  Patient expressed understanding and was in agreement with this plan. He also understands that He can call clinic at any time with any questions, concerns, or complaints.   Lloyd Huger, MD   07/04/2015 5:42 PM

## 2015-07-04 NOTE — Progress Notes (Signed)
[place]  Telephone:(336) [phone] Fax:(336) [phone]  ID: [name] OB: [birth year]  MR#: [id number]  IRW#:[id number]  [name], MD as PCP - General (Family Medicine) [name], MD as Referring Physician (Vascular Surgery) [name], MD as Consulting Physician (Cardiology) [name], MD as Consulting Physician (Hematology and Oncology) Clent Jacks, RN as Registered Nurse  CHIEF COMPLAINT:  Chief Complaint  Patient presents with  . Pancreatic Cancer    INTERVAL HISTORY: Patient returns to clinic today for further evaluation and consideration of cycle 1, day 15 of gemcitabine and Abraxane. He currently feels well and is asymptomatic. He has no neurologic complaints. He denies any recent fevers. He denies any chest pain or shortness of breath.  He has a good appetite and denies any nausea, vomiting, constipation, or diarrhea. He has no changes in his bowel movements and denies any melanotic or hematochezia. He has no urinary complaints. Patient offers no specific complaints today.  REVIEW OF SYSTEMS:   Review of Systems  Constitutional: Positive for malaise/fatigue. Negative for weight loss.  Respiratory: Negative.   Cardiovascular: Negative.   Gastrointestinal: Negative for abdominal pain.  Neurological: Positive for weakness.  Endo/Heme/Allergies: Does not bruise/bleed easily.    As per HPI. Otherwise, a complete review of systems is negatve.  PAST MEDICAL HISTORY: Past Medical History  Diagnosis Date  . Diabetes mellitus without complication ([place])   . Hypertension   . PAD (peripheral artery disease) ([place])   . Gout   . Hypothyroidism   . History of chicken pox   . History of measles as a child   . History of mumps as a child   . SSS (sick sinus syndrome) ([place])   . Chronic kidney disease     ckd  . GERD (gastroesophageal reflux disease)   . Neuropathy ([place])   . Dysrhythmia     a fib  . Cancer [facility])      Skin cancer  . Pancreatic mass 04/2015    scheduled for biopsy on 05/08/2015  . Presence of permanent cardiac pacemaker     PAST SURGICAL HISTORY: Past Surgical History  Procedure Laterality Date  . Carotid endarterectomy Right 2015    Dr. Lucky Cowboy  . Heart stents  2015    one stent per patient  . Bilateral leg stents Bilateral 2015    Bilateral iliac and right popliteal  . Skin cancer excision    . Cataract extraction Bilateral 09/2013  . Angioplasty  2009, 2010    done by Dr. Hulda Humphrey and Dr. Dian Situ  . Eye surgery    . Coronary angioplasty  2015  . Appendectomy  1962  . Pacemaker insertion N/A 04/30/2015    Procedure: INSERTION PACEMAKER;  Surgeon: Isaias Cowman, MD;  Location: [facility] ORS;  Service: Cardiovascular;  Laterality: N/A;  . Upper esophageal endoscopic ultrasound (eus) N/A 05/08/2015    Procedure: UPPER ESOPHAGEAL ENDOSCOPIC ULTRASOUND (EUS);  Surgeon: Cora Daniels, MD;  Location: [facility] ENDOSCOPY;  Service: Endoscopy;  Laterality: N/A;  . Peripheral vascular catheterization N/A 06/04/2015    Procedure: Porta Cath Insertion;  Surgeon: [name], MD;  Location: [place] CV LAB;  Service: Cardiovascular;  Laterality: N/A;    FAMILY HISTORY Family History  Problem Relation Age of Onset  . Diabetes Mother   . Cancer Sister     Uterine  . Arthritis Father   . Kidney disease Father        ADVANCED DIRECTIVES:  HEALTH MAINTENANCE: Social History  Substance Use Topics  . Smoking status: Never Smoker   . Smokeless tobacco: Never Used  . Alcohol Use: No     Colonoscopy:  PAP:  Bone density:  Lipid panel:  Allergies  Allergen Reactions  . Adhesive [Tape] Other (See Comments)    Patient states that adhesive tape causes his skin to tear very easily and asks for paper tape ONLY  . Crestor [Rosuvastatin Calcium] Rash  . Penicillins Rash    Current Outpatient Prescriptions  Medication Sig Dispense Refill  . allopurinol (ZYLOPRIM) 100 MG tablet  TAKE 2 TABLETS BY MOUTH EVERY MORNING 60 tablet 11  . bumetanide (BUMEX) 2 MG tablet Take 2 mg by mouth daily. 1 or 2 tablets AS NEEDED    . Garlic 4315 MG CAPS Take by mouth daily.    Marland Kitchen glipiZIDE (GLUCOTROL) 5 MG tablet Take by mouth 2 (two) times daily before a meal.    . Insulin Glargine (TOUJEO SOLOSTAR) 300 UNIT/ML SOPN Inject 10 Units into the skin daily.    Marland Kitchen levothyroxine (SYNTHROID, LEVOTHROID) 112 MCG tablet Take 112 mcg by mouth daily before breakfast.    . lidocaine-prilocaine (EMLA) cream Apply 1 application topically as needed. Apply to port then cover with saran wrap 1-2 hours before chemotherapy appointment 30 g 2  . lisinopril (PRINIVIL,ZESTRIL) 10 MG tablet Take 5 mg by mouth daily.     . montelukast (SINGULAIR) 10 MG tablet Take 10 mg by mouth at bedtime.    . Omega-3 Fatty Acids (FISH OIL) 1000 MG CAPS Take by mouth 2 (two) times daily.    . pravastatin (PRAVACHOL) 40 MG tablet Take 40 mg by mouth daily.    . prochlorperazine (COMPAZINE) 10 MG tablet Take 1 tablet (10 mg total) by mouth every 6 (six) hours as needed for nausea or vomiting. 30 tablet 2  . ranitidine (ZANTAC) 150 MG tablet Take 150 mg by mouth daily. 1-2 tabs daily    . silver sulfADIAZINE (SILVADENE) 1 % cream Apply 1 application topically 2 (two) times daily. to affected area    . vitamin B-12 (CYANOCOBALAMIN) 1000 MCG tablet Inject 1,000 mcg into the muscle every 30 (thirty) days.    . clopidogrel (PLAVIX) 75 MG tablet Take 1 tablet by mouth  daily 90 tablet 1  . doxycycline (VIBRA-TABS) 100 MG tablet Take 1 tablet (100 mg total) by mouth every 12 (twelve) hours. 18 tablet 0  . gabapentin (NEURONTIN) 300 MG capsule Take 3 capsules by mouth at bedtime 270 capsule 1  . mupirocin cream (BACTROBAN) 2 % Apply 1 application topically 2 (two) times daily. 15 g 0  . mupirocin ointment (BACTROBAN) 2 % Place 1 application into the nose 2 (two) times daily. 22 g 0  . oxyCODONE-acetaminophen (PERCOCET/ROXICET) 5-325 MG  tablet Take 1 tablet by mouth every 8 (eight) hours as needed for moderate pain. 30 tablet 0   No current facility-administered medications for this visit.   Facility-Administered Medications Ordered in Other Visits  Medication Dose Route Frequency Provider Last Rate Last Dose  . sodium chloride 0.9 % injection 10 mL  10 mL Intravenous PRN Lloyd Huger, MD      . sodium chloride 0.9 % injection 10 mL  10 mL Intracatheter PRN Lloyd Huger, MD   10 mL at 06/13/15 0919    OBJECTIVE: Filed Vitals:   06/19/15 1215  BP: 108/66  Pulse: 88  Temp: 96.5 F (35.8 C)  Resp: 16  Body mass index is 26.64 kg/(m^2).    ECOG FS:1 - Symptomatic but completely ambulatory  General: Well-developed, well-nourished, no acute distress. Eyes: Pink conjunctiva, anicteric sclera. Lungs: Clear to auscultation bilaterally. Heart: Regular rate and rhythm. No rubs, murmurs, or gallops. Abdomen: Soft, nontender, nondistended. No organomegaly noted, normoactive bowel sounds. Musculoskeletal: No edema, cyanosis, or clubbing. Neuro: Alert, answering all questions appropriately. Cranial nerves grossly intact. Skin: No rashes or petechiae noted. Psych: Normal affect.  LAB RESULTS:  Lab Results  Component Value Date   NA 135 07/03/2015   K 3.9 07/03/2015   CL 94* 07/03/2015   CO2 33* 07/03/2015   GLUCOSE 180* 07/03/2015   BUN 20 07/03/2015   CREATININE 1.72* 07/03/2015   CALCIUM 8.2* 07/03/2015   PROT 6.6 07/03/2015   ALBUMIN 3.5 07/03/2015   AST 63* 07/03/2015   ALT 37 07/03/2015   ALKPHOS 121 07/03/2015   BILITOT 1.4* 07/03/2015   GFRNONAA 34* 07/03/2015   GFRAA 39* 07/03/2015    Lab Results  Component Value Date   WBC 5.0 07/03/2015   NEUTROABS 3.7 07/03/2015   HGB 14.3 07/03/2015   HCT 43.8 07/03/2015   MCV 97.1 07/03/2015   PLT 181 07/03/2015     STUDIES: US Venous Img Lower Unilateral Left  06/28/2015  CLINICAL DATA:  Swelling for 3 days. EXAM: LEFT LOWER EXTREMITY  VENOUS DOPPLER ULTRASOUND TECHNIQUE: Gray-scale sonography with graded compression, as well as color Doppler and duplex ultrasound were performed to evaluate the lower extremity deep venous systems from the level of the common femoral vein and including the common femoral, femoral, profunda femoral, popliteal and calf veins including the posterior tibial, peroneal and gastrocnemius veins when visible. The superficial great saphenous vein was also interrogated. Spectral Doppler was utilized to evaluate flow at rest and with distal augmentation maneuvers in the common femoral, femoral and popliteal veins. COMPARISON:  None. FINDINGS: Contralateral Common Femoral Vein: Respiratory phasicity is normal and symmetric with the symptomatic side. No evidence of thrombus. Normal compressibility. Common Femoral Vein: No evidence of thrombus. Normal compressibility, respiratory phasicity and response to augmentation. Saphenofemoral Junction: No evidence of thrombus. Normal compressibility and flow on color Doppler imaging. Profunda Femoral Vein: No evidence of thrombus. Normal compressibility and flow on color Doppler imaging. Femoral Vein: No evidence of thrombus. Normal compressibility, respiratory phasicity and response to augmentation. Popliteal Vein: No evidence of thrombus. Normal compressibility, respiratory phasicity and response to augmentation. Calf Veins: No evidence of thrombus. Normal compressibility and flow on color Doppler imaging. Superficial Great Saphenous Vein: No evidence of thrombus. Normal compressibility and flow on color Doppler imaging. Venous Reflux:  None. Other Findings:  None. IMPRESSION: No evidence of left lower extremity deep venous thrombosis. Electronically Signed   By: Abigail Miyamoto M.D.   On: 06/28/2015 10:58    ASSESSMENT: Stage III adenocarcinoma of the pancreas.  PLAN:    1. Pancreatic mass: PET scan results reviewed independently. CA-19-9 is elevated at 315.  EUS confirmed  pancreatic adenocarcinoma. Patient is not a surgical candidate.  Proceed with cycle 1, day 15 of gemcitabine and Abraxane. Return to clinic in 1 week for lab work and then in 2 weeks for consideration of cycle 2, day 1. Patient may only be able to tolerate treatment on days 1 and 15.  Will reimage after ~3 cycles.  2. Pain: Continue fentanyl patch and oxycodone as needed.  3. Thrombocytopenia: Resolved.  Secondary to chemotherapy, monitor. 4. Elevated creatinine: Approximately patient's baseline, monitor.  Patient expressed understanding and  was in agreement with this plan. He also understands that He can call clinic at any time with any questions, concerns, or complaints.   Lloyd Huger, MD   07/04/2015 5:38 PM

## 2015-07-04 NOTE — Patient Instructions (Signed)
Ask Dr. Grayland Ormond when best time is to get your flu shot

## 2015-07-04 NOTE — Progress Notes (Signed)
Patient: [name] Male    DOB: Sep 10, 1925   81 y.o.   MRN: [id number] Visit Date: 07/04/2015  Today's Provider: Lelon Huh, MD   Chief Complaint  Patient presents with  . Hospitalization Follow-up  . Cellulitis   Subjective:    HPI   Follow up Hospitalization  Patient was admitted to [facility] on 06/27/2015 and discharged on 06/30/2015. He was treated for Cellulitis. Treatment for this included IV vancomycin and starting on 10 days Doxycyline upon discharge. He reports good compliance with treatment. He reports this condition is Improved.    ------------------------------------------------------------------------------------  Cellulitis Follow up: Patient has been changing the dressing twice daily and apply ointment.He has been having to take 2 tablets of Percocet daily due to the pain, although he states it is much less painful since discharge from the hospital. Patient denies any chills, fevers, sweats or drainage from his leg.     Allergies  Allergen Reactions  . Adhesive [Tape] Other (See Comments)    Patient states that adhesive tape causes his skin to tear very easily and asks for paper tape ONLY  . Crestor [Rosuvastatin Calcium] Rash  . Penicillins Rash   Previous Medications   ALLOPURINOL (ZYLOPRIM) 100 MG TABLET    TAKE 2 TABLETS BY MOUTH EVERY MORNING   BUMETANIDE (BUMEX) 2 MG TABLET    Take 2 mg by mouth daily. 1 or 2 tablets AS NEEDED   CLOPIDOGREL (PLAVIX) 75 MG TABLET    Take 1 tablet by mouth  daily   DOXYCYCLINE (VIBRA-TABS) 100 MG TABLET    Take 1 tablet (100 mg total) by mouth every 12 (twelve) hours.   GABAPENTIN (NEURONTIN) 300 MG CAPSULE    Take 3 capsules by mouth at bedtime   GARLIC 8341 MG CAPS    Take by mouth daily.   GLIPIZIDE (GLUCOTROL) 5 MG TABLET    Take by mouth 2 (two) times daily before a meal.   INSULIN GLARGINE (TOUJEO SOLOSTAR) 300 UNIT/ML SOPN    Inject 10 Units into the skin daily.   LEVOTHYROXINE (SYNTHROID,  LEVOTHROID) 112 MCG TABLET    Take 112 mcg by mouth daily before breakfast.   LIDOCAINE-PRILOCAINE (EMLA) CREAM    Apply 1 application topically as needed. Apply to port then cover with saran wrap 1-2 hours before chemotherapy appointment   LISINOPRIL (PRINIVIL,ZESTRIL) 10 MG TABLET    Take 5 mg by mouth daily.    MONTELUKAST (SINGULAIR) 10 MG TABLET    Take 10 mg by mouth at bedtime.   MUPIROCIN CREAM (BACTROBAN) 2 %    Apply 1 application topically 2 (two) times daily.   OMEGA-3 FATTY ACIDS (FISH OIL) 1000 MG CAPS    Take by mouth 2 (two) times daily.   OXYCODONE-ACETAMINOPHEN (PERCOCET/ROXICET) 5-325 MG TABLET    Take 1 tablet by mouth every 8 (eight) hours as needed for moderate pain.   PRAVASTATIN (PRAVACHOL) 40 MG TABLET    Take 40 mg by mouth daily.   PROCHLORPERAZINE (COMPAZINE) 10 MG TABLET    Take 1 tablet (10 mg total) by mouth every 6 (six) hours as needed for nausea or vomiting.   RANITIDINE (ZANTAC) 150 MG TABLET    Take 150 mg by mouth daily. 1-2 tabs daily   SILVER SULFADIAZINE (SILVADENE) 1 % CREAM    Apply 1 application topically 2 (two) times daily. to affected area   VITAMIN B-12 (CYANOCOBALAMIN) 1000 MCG TABLET    Inject 1,000 mcg into the  muscle every 30 (thirty) days.    Review of Systems  Constitutional: Negative for fever, chills and appetite change.  Respiratory: Negative for chest tightness, shortness of breath and wheezing.   Cardiovascular: Negative for chest pain and palpitations.  Gastrointestinal: Negative for nausea, vomiting and abdominal pain.  Musculoskeletal: Positive for myalgias (lower left leg pain).  Skin: Positive for color change (redness in lower left leg).    Social History  Substance Use Topics  . Smoking status: Never Smoker   . Smokeless tobacco: Never Used  . Alcohol Use: No   Objective:   BP 112/56 mmHg  Pulse 72  Temp(Src) 98.1 F (36.7 C) (Oral)  Resp 16  SpO2 95%  Physical Exam  LE: extensive erythema left anterior leg,  greatly improved since visit on 06-27-15. About 1.2cm oval skin ulceration with small amount white discharge.     Assessment & Plan:     1. Cellulitis and abscess of leg Greatly improved since hospitalization and IV Vancomycin. Now on 4th day of 10 day course of doxycycline. Will call next week and likely extend course of doxycycline another week.   2. Diabetes mellitus with neurological manifestations, uncontrolled ([place]) He has had some sugars running in the 200s and has increase Tujeo which maay continue to day   3. Atherosclerosis of native artery of left lower extremity with ulceration of calf (HCC) Continue daily dressing changes of leg ulceration with mupirocin cream.        Lelon Huh, MD  [facility]

## 2015-07-08 ENCOUNTER — Telehealth: Payer: Self-pay | Admitting: Family Medicine

## 2015-07-08 MED ORDER — DOXYCYCLINE HYCLATE 100 MG PO TABS: 100.0000 mg | ORAL_TABLET | Freq: Two times a day (BID) | ORAL | Status: DC

## 2015-07-08 NOTE — Telephone Encounter (Signed)
I'd like to see his leg when he finishes the next round of antibiotics, so he can reschedule his appointment to the end of next week.

## 2015-07-08 NOTE — Telephone Encounter (Signed)
Patient stated that his is still draining a little bit. Sent rx to pharmacy. Patient wanted to know if he still needs to come in on Friday 07/11/15 for f/u appt?

## 2015-07-08 NOTE — Telephone Encounter (Signed)
Rescheduled pt's appt

## 2015-07-08 NOTE — Telephone Encounter (Signed)
Please call patient to see how leg is doing and if sore is still draining. Will need to send in another round of doxycycline 100mg  twice a day for 10 days until it is completely better. Thanks.

## 2015-07-08 NOTE — Telephone Encounter (Signed)
-----   Message from Birdie Sons, MD sent at 07/04/2015  5:31 PM EDT ----- Regarding: Call wednesday about leg.  Probably needs another week of doxycycline called in.  See if needs to keep appointment 10-28 or reschedue

## 2015-07-09 ENCOUNTER — Telehealth: Payer: Self-pay

## 2015-07-09 MED ORDER — MAGIC MOUTHWASH: 10.0000 mL | Freq: Four times a day (QID) | ORAL | Status: AC | PRN

## 2015-07-09 NOTE — Telephone Encounter (Signed)
OK. Please call in per signed order

## 2015-07-09 NOTE — Telephone Encounter (Signed)
Patient is requesting a RX for Dukes magic mouth wash for thrush. Pharmacy- [facility].

## 2015-07-10 NOTE — Telephone Encounter (Signed)
RX called in-aa 

## 2015-07-11 ENCOUNTER — Ambulatory Visit: Payer: Self-pay | Admitting: Family Medicine

## 2015-07-14 ENCOUNTER — Other Ambulatory Visit: Payer: Self-pay | Admitting: *Deleted

## 2015-07-14 ENCOUNTER — Other Ambulatory Visit: Payer: Self-pay

## 2015-07-14 ENCOUNTER — Telehealth: Payer: Self-pay

## 2015-07-14 DIAGNOSIS — C252 Malignant neoplasm of tail of pancreas: Secondary | ICD-10-CM

## 2015-07-14 NOTE — Telephone Encounter (Signed)
  Oncology Nurse Navigator Documentation    Navigator Encounter Type: Telephone (07/14/15 1200)         Interventions: Coordination of Care (07/14/15 1200)   Coordination of Care: MD Appointments (07/14/15 1200)        Time Spent with Patient: 30 (07/14/15 1200)   Received call from Mr Lopezmartinez. He is feeling very weak. States he can hardly get up and he doesn't want to. Not eating, states no taste. Reports he is drinking some liquids. Spoke with Dr Grayland Ormond. Cancer center cannot accomodate today but we can see him tomorrow. Instructed that he may need to go to the ED if he feels he needs immediate assistance. He states he would like to come tomorrow. Verbalized understanding that he should go to the ED if symptoms worsen in the interim.

## 2015-07-15 ENCOUNTER — Inpatient Hospital Stay (HOSPITAL_BASED_OUTPATIENT_CLINIC_OR_DEPARTMENT_OTHER): Payer: Medicare Other | Admitting: Oncology

## 2015-07-15 ENCOUNTER — Inpatient Hospital Stay: Payer: Medicare Other | Attending: Oncology

## 2015-07-15 ENCOUNTER — Encounter: Payer: Self-pay | Admitting: Oncology

## 2015-07-15 ENCOUNTER — Inpatient Hospital Stay: Payer: Medicare Other

## 2015-07-15 VITALS — BP 116/62 | HR 67 | Temp 97.3°F | Resp 18 | Wt 186.3 lb

## 2015-07-15 DIAGNOSIS — E119 Type 2 diabetes mellitus without complications: Secondary | ICD-10-CM

## 2015-07-15 DIAGNOSIS — R5383 Other fatigue: Secondary | ICD-10-CM

## 2015-07-15 DIAGNOSIS — M109 Gout, unspecified: Secondary | ICD-10-CM | POA: Diagnosis not present

## 2015-07-15 DIAGNOSIS — Z85828 Personal history of other malignant neoplasm of skin: Secondary | ICD-10-CM

## 2015-07-15 DIAGNOSIS — R531 Weakness: Secondary | ICD-10-CM

## 2015-07-15 DIAGNOSIS — N189 Chronic kidney disease, unspecified: Secondary | ICD-10-CM | POA: Insufficient documentation

## 2015-07-15 DIAGNOSIS — Z950 Presence of cardiac pacemaker: Secondary | ICD-10-CM | POA: Insufficient documentation

## 2015-07-15 DIAGNOSIS — R944 Abnormal results of kidney function studies: Secondary | ICD-10-CM | POA: Insufficient documentation

## 2015-07-15 DIAGNOSIS — I129 Hypertensive chronic kidney disease with stage 1 through stage 4 chronic kidney disease, or unspecified chronic kidney disease: Secondary | ICD-10-CM

## 2015-07-15 DIAGNOSIS — C252 Malignant neoplasm of tail of pancreas: Secondary | ICD-10-CM

## 2015-07-15 DIAGNOSIS — C259 Malignant neoplasm of pancreas, unspecified: Secondary | ICD-10-CM | POA: Insufficient documentation

## 2015-07-15 DIAGNOSIS — I739 Peripheral vascular disease, unspecified: Secondary | ICD-10-CM | POA: Diagnosis not present

## 2015-07-15 DIAGNOSIS — R630 Anorexia: Secondary | ICD-10-CM | POA: Insufficient documentation

## 2015-07-15 DIAGNOSIS — I4891 Unspecified atrial fibrillation: Secondary | ICD-10-CM

## 2015-07-15 DIAGNOSIS — Z794 Long term (current) use of insulin: Secondary | ICD-10-CM | POA: Insufficient documentation

## 2015-07-15 DIAGNOSIS — Z79899 Other long term (current) drug therapy: Secondary | ICD-10-CM

## 2015-07-15 DIAGNOSIS — K219 Gastro-esophageal reflux disease without esophagitis: Secondary | ICD-10-CM

## 2015-07-15 DIAGNOSIS — E039 Hypothyroidism, unspecified: Secondary | ICD-10-CM | POA: Diagnosis not present

## 2015-07-15 DIAGNOSIS — I495 Sick sinus syndrome: Secondary | ICD-10-CM

## 2015-07-15 LAB — CBC WITH DIFFERENTIAL/PLATELET
Basophils Absolute: 0 10*3/uL (ref 0–0.1)
Basophils Relative: 1 %
Eosinophils Absolute: 0 10*3/uL (ref 0–0.7)
Eosinophils Relative: 0 %
HEMATOCRIT: 42.5 % (ref 40.0–52.0)
HEMOGLOBIN: 13.7 g/dL (ref 13.0–18.0)
LYMPHS ABS: 0.4 10*3/uL — AB (ref 1.0–3.6)
Lymphocytes Relative: 13 %
MCH: 31.7 pg (ref 26.0–34.0)
MCHC: 32.2 g/dL (ref 32.0–36.0)
MCV: 98.5 fL (ref 80.0–100.0)
MONOS PCT: 26 %
Monocytes Absolute: 0.8 10*3/uL (ref 0.2–1.0)
NEUTROS ABS: 1.9 10*3/uL (ref 1.4–6.5)
NEUTROS PCT: 60 %
Platelets: 98 10*3/uL — ABNORMAL LOW (ref 150–440)
RBC: 4.32 MIL/uL — ABNORMAL LOW (ref 4.40–5.90)
RDW: 18.9 % — ABNORMAL HIGH (ref 11.5–14.5)
WBC: 3.1 10*3/uL — ABNORMAL LOW (ref 3.8–10.6)

## 2015-07-15 LAB — COMPREHENSIVE METABOLIC PANEL
ALK PHOS: 117 U/L (ref 38–126)
ALT: 46 U/L (ref 17–63)
ANION GAP: 8 (ref 5–15)
AST: 64 U/L — ABNORMAL HIGH (ref 15–41)
Albumin: 3.2 g/dL — ABNORMAL LOW (ref 3.5–5.0)
BUN: 18 mg/dL (ref 6–20)
CALCIUM: 7.7 mg/dL — AB (ref 8.9–10.3)
CO2: 28 mmol/L (ref 22–32)
Chloride: 93 mmol/L — ABNORMAL LOW (ref 101–111)
Creatinine, Ser: 1.58 mg/dL — ABNORMAL HIGH (ref 0.61–1.24)
GFR calc non Af Amer: 37 mL/min — ABNORMAL LOW (ref >60)
GFR, EST AFRICAN AMERICAN: 43 mL/min — AB (ref >60)
Glucose, Bld: 179 mg/dL — ABNORMAL HIGH (ref 65–99)
Potassium: 4.2 mmol/L (ref 3.5–5.1)
Sodium: 129 mmol/L — ABNORMAL LOW (ref 135–145)
TOTAL PROTEIN: 6.1 g/dL — AB (ref 6.5–8.1)
Total Bilirubin: 1.7 mg/dL — ABNORMAL HIGH (ref 0.3–1.2)

## 2015-07-15 MED ORDER — SODIUM CHLORIDE 0.9 % IV SOLN
10.0000 mg | Freq: Once | INTRAVENOUS | Status: AC
  Administered 2015-07-15: 10 mg via INTRAVENOUS
  Filled 2015-07-15: qty 1

## 2015-07-15 MED ORDER — HEPARIN SOD (PORK) LOCK FLUSH 100 UNIT/ML IV SOLN
500.0000 [IU] | Freq: Once | INTRAVENOUS | Status: AC
  Administered 2015-07-15: 500 [IU] via INTRAVENOUS

## 2015-07-15 MED ORDER — SODIUM CHLORIDE 0.9 % IV SOLN
INTRAVENOUS | Status: DC
  Administered 2015-07-15: 13:00:00 via INTRAVENOUS
  Filled 2015-07-15: qty 1000

## 2015-07-15 NOTE — Progress Notes (Signed)
[place]  [phone] [phone]  ID: [name] OB: [birth year]  MR#: [id number]  QBV#:[id number]  [name], MD as PCP - General (Family Medicine) [name], MD as Referring Physician (Vascular Surgery) [name], MD as Consulting Physician (Cardiology) [name], MD as Consulting Physician (Hematology and Oncology) Clent Jacks, RN as Registered Nurse  CHIEF COMPLAINT:  Chief Complaint  Patient presents with  . Pancreatic Cancer  . weakness, dizzness    INTERVAL HISTORY: Patient returns to clinic today as an add-on to discuss discontinuing treatments.  He has increased weakness and fatigue.  He has a poor appetite. He has no neurologic complaints. He denies any recent fevers. He denies any chest pain or shortness of breath.  He denies any nausea, vomiting, constipation, or diarrhea. He has no changes in his bowel movements and denies any melanotic or hematochezia. He has no urinary complaints. Patient offers no further specific complaints today.  REVIEW OF SYSTEMS:   Review of Systems  Constitutional: Positive for malaise/fatigue. Negative for weight loss.  Respiratory: Negative.   Cardiovascular: Negative.   Gastrointestinal: Negative for abdominal pain.  Neurological: Positive for weakness.  Endo/Heme/Allergies: Does not bruise/bleed easily.    As per HPI. Otherwise, a complete review of systems is negatve.  PAST MEDICAL HISTORY: Past Medical History  Diagnosis Date  . Diabetes mellitus without complication ([place])   . Hypertension   . PAD (peripheral artery disease) ([place])   . Gout   . Hypothyroidism   . History of chicken pox   . History of measles as a child   . History of mumps as a child   . SSS (sick sinus syndrome) ([place])   . Chronic kidney disease     ckd  . GERD (gastroesophageal reflux disease)   . Neuropathy ([place])   . Dysrhythmia     a fib  . Cancer [facility])     Skin  cancer  . Pancreatic mass 04/2015    scheduled for biopsy on 05/08/2015  . Presence of permanent cardiac pacemaker     PAST SURGICAL HISTORY: Past Surgical History  Procedure Laterality Date  . Carotid endarterectomy Right 2015    Dr. Lucky Cowboy  . Heart stents  2015    one stent per patient  . Bilateral leg stents Bilateral 2015    Bilateral iliac and right popliteal  . Skin cancer excision    . Cataract extraction Bilateral 09/2013  . Angioplasty  2009, 2010    done by Dr. Hulda Humphrey and Dr. Dian Situ  . Eye surgery    . Coronary angioplasty  2015  . Appendectomy  1962  . Pacemaker insertion N/A 04/30/2015    Procedure: INSERTION PACEMAKER;  Surgeon: Isaias Cowman, MD;  Location: [facility] ORS;  Service: Cardiovascular;  Laterality: N/A;  . Upper esophageal endoscopic ultrasound (eus) N/A 05/08/2015    Procedure: UPPER ESOPHAGEAL ENDOSCOPIC ULTRASOUND (EUS);  Surgeon: Cora Daniels, MD;  Location: [facility] ENDOSCOPY;  Service: Endoscopy;  Laterality: N/A;  . Peripheral vascular catheterization N/A 06/04/2015    Procedure: Porta Cath Insertion;  Surgeon: [name], MD;  Location: [facility] CV LAB;  Service: Cardiovascular;  Laterality: N/A;    FAMILY HISTORY Family History  Problem Relation Age of Onset  . Diabetes Mother   . Cancer Sister     Uterine  . Arthritis Father   . Kidney disease Father        ADVANCED DIRECTIVES:  HEALTH MAINTENANCE: Social History  Substance Use Topics  . Smoking status: Never Smoker   . Smokeless tobacco: Never Used  . Alcohol Use: No     Colonoscopy:  PAP:  Bone density:  Lipid panel:  Allergies  Allergen Reactions  . Adhesive [Tape] Other (See Comments)    Patient states that adhesive tape causes his skin to tear very easily and asks for paper tape ONLY  . Crestor [Rosuvastatin Calcium] Rash  . Penicillins Rash    Current Outpatient Prescriptions  Medication Sig Dispense Refill  . allopurinol (ZYLOPRIM) 100 MG tablet TAKE 2  TABLETS BY MOUTH EVERY MORNING 60 tablet 11  . bumetanide (BUMEX) 2 MG tablet Take 2 mg by mouth daily. 1 or 2 tablets AS NEEDED    . clopidogrel (PLAVIX) 75 MG tablet Take 1 tablet by mouth  daily 90 tablet 1  . doxycycline (VIBRA-TABS) 100 MG tablet Take 1 tablet (100 mg total) by mouth 2 (two) times daily. 20 tablet 0  . gabapentin (NEURONTIN) 300 MG capsule Take 3 capsules by mouth at bedtime 270 capsule 1  . Garlic 3220 MG CAPS Take by mouth daily.    Marland Kitchen levothyroxine (SYNTHROID, LEVOTHROID) 112 MCG tablet Take 112 mcg by mouth daily before breakfast.    . lidocaine-prilocaine (EMLA) cream Apply 1 application topically as needed. Apply to port then cover with saran wrap 1-2 hours before chemotherapy appointment 30 g 2  . lisinopril (PRINIVIL,ZESTRIL) 10 MG tablet Take 5 mg by mouth daily.     . magic mouthwash SOLN Take 10 mLs by mouth 4 (four) times daily as needed for mouth pain. 300 mL 0  . montelukast (SINGULAIR) 10 MG tablet Take 10 mg by mouth at bedtime.    . mupirocin cream (BACTROBAN) 2 % Apply 1 application topically 2 (two) times daily. 15 g 0  . Omega-3 Fatty Acids (FISH OIL) 1000 MG CAPS Take by mouth 2 (two) times daily.    Marland Kitchen oxyCODONE-acetaminophen (PERCOCET/ROXICET) 5-325 MG tablet Take 1 tablet by mouth every 8 (eight) hours as needed for moderate pain. 30 tablet 0  . pravastatin (PRAVACHOL) 40 MG tablet Take 40 mg by mouth daily.    . prochlorperazine (COMPAZINE) 10 MG tablet Take 1 tablet (10 mg total) by mouth every 6 (six) hours as needed for nausea or vomiting. 30 tablet 2  . ranitidine (ZANTAC) 150 MG tablet Take 150 mg by mouth daily. 1-2 tabs daily    . silver sulfADIAZINE (SILVADENE) 1 % cream Apply 1 application topically 2 (two) times daily. to affected area    . vitamin B-12 (CYANOCOBALAMIN) 1000 MCG tablet Inject 1,000 mcg into the muscle every 30 (thirty) days.    Marland Kitchen glipiZIDE (GLUCOTROL) 5 MG tablet Take by mouth 2 (two) times daily before a meal.    . Insulin  Glargine (TOUJEO SOLOSTAR) 300 UNIT/ML SOPN Inject 10 Units into the skin daily.     Current Facility-Administered Medications  Medication Dose Route Frequency Provider Last Rate Last Dose  . 0.9 %  sodium chloride infusion   Intravenous Continuous Lloyd Huger, MD   Stopped at 07/15/15 1332   Facility-Administered Medications Ordered in Other Visits  Medication Dose Route Frequency Provider Last Rate Last Dose  . sodium chloride 0.9 % injection 10 mL  10 mL Intravenous PRN Lloyd Huger, MD      . sodium chloride 0.9 % injection 10 mL  10 mL Intracatheter PRN Lloyd Huger, MD   10 mL at  06/13/15 0919    OBJECTIVE: Filed Vitals:   07/15/15 1132  BP: 116/62  Pulse: 67  Temp: 97.3 F (36.3 C)  Resp: 18     Body mass index is 25.26 kg/(m^2).    ECOG FS:1 - Symptomatic but completely ambulatory  General: Well-developed, well-nourished, no acute distress. Eyes: Pink conjunctiva, anicteric sclera. Lungs: Clear to auscultation bilaterally. Heart: Regular rate and rhythm. No rubs, murmurs, or gallops. Abdomen: Soft, nontender, nondistended. No organomegaly noted, normoactive bowel sounds. Musculoskeletal: No edema, cyanosis, or clubbing. Neuro: Alert, answering all questions appropriately. Cranial nerves grossly intact. Skin: no edema. Psych: Normal affect.  LAB RESULTS:  Lab Results  Component Value Date   NA 129* 07/15/2015   K 4.2 07/15/2015   CL 93* 07/15/2015   CO2 28 07/15/2015   GLUCOSE 179* 07/15/2015   BUN 18 07/15/2015   CREATININE 1.58* 07/15/2015   CALCIUM 7.7* 07/15/2015   PROT 6.1* 07/15/2015   ALBUMIN 3.2* 07/15/2015   AST 64* 07/15/2015   ALT 46 07/15/2015   ALKPHOS 117 07/15/2015   BILITOT 1.7* 07/15/2015   GFRNONAA 37* 07/15/2015   GFRAA 43* 07/15/2015    Lab Results  Component Value Date   WBC 3.1* 07/15/2015   NEUTROABS 1.9 07/15/2015   HGB 13.7 07/15/2015   HCT 42.5 07/15/2015   MCV 98.5 07/15/2015   PLT 98* 07/15/2015      STUDIES: US Venous Img Lower Unilateral Left  06/28/2015  CLINICAL DATA:  Swelling for 3 days. EXAM: LEFT LOWER EXTREMITY VENOUS DOPPLER ULTRASOUND TECHNIQUE: Gray-scale sonography with graded compression, as well as color Doppler and duplex ultrasound were performed to evaluate the lower extremity deep venous systems from the level of the common femoral vein and including the common femoral, femoral, profunda femoral, popliteal and calf veins including the posterior tibial, peroneal and gastrocnemius veins when visible. The superficial great saphenous vein was also interrogated. Spectral Doppler was utilized to evaluate flow at rest and with distal augmentation maneuvers in the common femoral, femoral and popliteal veins. COMPARISON:  None. FINDINGS: Contralateral Common Femoral Vein: Respiratory phasicity is normal and symmetric with the symptomatic side. No evidence of thrombus. Normal compressibility. Common Femoral Vein: No evidence of thrombus. Normal compressibility, respiratory phasicity and response to augmentation. Saphenofemoral Junction: No evidence of thrombus. Normal compressibility and flow on color Doppler imaging. Profunda Femoral Vein: No evidence of thrombus. Normal compressibility and flow on color Doppler imaging. Femoral Vein: No evidence of thrombus. Normal compressibility, respiratory phasicity and response to augmentation. Popliteal Vein: No evidence of thrombus. Normal compressibility, respiratory phasicity and response to augmentation. Calf Veins: No evidence of thrombus. Normal compressibility and flow on color Doppler imaging. Superficial Great Saphenous Vein: No evidence of thrombus. Normal compressibility and flow on color Doppler imaging. Venous Reflux:  None. Other Findings:  None. IMPRESSION: No evidence of left lower extremity deep venous thrombosis. Electronically Signed   By: Abigail Miyamoto M.D.   On: 06/28/2015 10:58    ASSESSMENT: Stage III adenocarcinoma of the  pancreas.  PLAN:    1. Pancreatic mass: PET scan results reviewed independently. CA-19-9 is elevated at 315.  EUS confirmed pancreatic adenocarcinoma. Patient is not a surgical candidate.  After lengthly discussion with the patient and his son, he has elected to discontinue treatments altogether.  He has a living will at home and does not desire hospice care at this time.  No follow up has been scheduled. No further imaging is necessary.  2. Pain: Continue fentanyl patch and oxycodone  as needed.  3. Thrombocytopenia: Resolved.  Secondary to chemotherapy, monitor. 4. Elevated creatinine: Approximately patient's baseline, monitor. 5. Cellulitis: Resolved. 6.  Weakness and fatigue: Patient has agreed to IVFs and IV steroids today.  Patient expressed understanding and was in agreement with this plan. He also understands that He can call clinic at any time with any questions, concerns, or complaints.   Lloyd Huger, MD   07/15/2015 10:47 PM

## 2015-07-15 NOTE — Progress Notes (Signed)
Patient states he has increased weakness this last week. Reports his appetite has decreased, "I only eat nibbles of food, nothing taste good. I drink boost 1 a day watered down". States he has been experiencing dizziness sitting or standing. Reports he has had slight nose bleeds when he blows his nose. Denies SOB or pain today.

## 2015-07-16 LAB — CANCER ANTIGEN 19-9: CA 19-9: 41 U/mL — ABNORMAL HIGH (ref 0–35)

## 2015-07-17 ENCOUNTER — Other Ambulatory Visit: Payer: Medicare Other

## 2015-07-17 ENCOUNTER — Ambulatory Visit: Payer: Self-pay

## 2015-07-17 ENCOUNTER — Ambulatory Visit: Payer: Self-pay | Admitting: Oncology

## 2015-07-18 ENCOUNTER — Encounter: Payer: Self-pay | Admitting: Family Medicine

## 2015-07-18 ENCOUNTER — Ambulatory Visit (INDEPENDENT_AMBULATORY_CARE_PROVIDER_SITE_OTHER): Payer: Medicare Other | Admitting: Family Medicine

## 2015-07-18 VITALS — BP 120/60 | HR 83 | Temp 98.2°F | Resp 16 | Wt 193.0 lb

## 2015-07-18 DIAGNOSIS — E1149 Type 2 diabetes mellitus with other diabetic neurological complication: Secondary | ICD-10-CM

## 2015-07-18 DIAGNOSIS — L98499 Non-pressure chronic ulcer of skin of other sites with unspecified severity: Secondary | ICD-10-CM

## 2015-07-18 DIAGNOSIS — Z23 Encounter for immunization: Secondary | ICD-10-CM

## 2015-07-18 DIAGNOSIS — L03116 Cellulitis of left lower limb: Secondary | ICD-10-CM

## 2015-07-18 DIAGNOSIS — C252 Malignant neoplasm of tail of pancreas: Secondary | ICD-10-CM | POA: Diagnosis not present

## 2015-07-18 DIAGNOSIS — E1165 Type 2 diabetes mellitus with hyperglycemia: Secondary | ICD-10-CM

## 2015-07-18 DIAGNOSIS — IMO0002 Reserved for concepts with insufficient information to code with codable children: Secondary | ICD-10-CM

## 2015-07-18 MED ORDER — DOXYCYCLINE HYCLATE 100 MG PO TABS: 100.0000 mg | ORAL_TABLET | Freq: Two times a day (BID) | ORAL | Status: DC

## 2015-07-18 MED ORDER — OXYCODONE-ACETAMINOPHEN 5-325 MG PO TABS: 1.0000 | ORAL_TABLET | Freq: Three times a day (TID) | ORAL | Status: DC | PRN

## 2015-07-18 NOTE — Progress Notes (Signed)
Patient: [name] Male    DOB: 12/28/1924   81 y.o.   MRN: [id number] Visit Date: 07/18/2015  Today's Provider: Lelon Huh, MD   Chief Complaint  Patient presents with  . Cellulitis    recheck   Subjective:    HPI  Cellulitis Follow up: Last office visit was 07/04/2015. No changes were made during that visit. Patient was to continue taking Doxycycline. Today patient comes in stating he has been taking doxycycline as prescribe and the left leg has improved quite a bit. His wife has been cleaning his wound and changing the dressing twice a day, Patient states he has also been using Mupirocin cream.  Pancreatic cancer He had been on chemotherapy with Dr. Grayland Ormond he last saw on 07/15/2015. He has been so weak from chemotherapy that he had to have a fluid bolus and injection of Decadron at that visit. He decided that he is not going to continue chemo treatments. He states his appetite started to improve yesterday, and is much better today. He ate a good meal and drinking plenty of fluids the last two day. He also states his blood sugars have been running in the 70s-80s in the morning so he skipped glipizide and cut back on Lantus.     Allergies  Allergen Reactions  . Adhesive [Tape] Other (See Comments)    Patient states that adhesive tape causes his skin to tear very easily and asks for paper tape ONLY  . Crestor [Rosuvastatin Calcium] Rash  . Penicillins Rash   Previous Medications   ALLOPURINOL (ZYLOPRIM) 100 MG TABLET    TAKE 2 TABLETS BY MOUTH EVERY MORNING   BUMETANIDE (BUMEX) 2 MG TABLET    Take 2 mg by mouth daily. 1 or 2 tablets AS NEEDED   CLOPIDOGREL (PLAVIX) 75 MG TABLET    Take 1 tablet by mouth  daily   DOXYCYCLINE (VIBRA-TABS) 100 MG TABLET    Take 1 tablet (100 mg total) by mouth 2 (two) times daily.   GABAPENTIN (NEURONTIN) 300 MG CAPSULE    Take 3 capsules by mouth at bedtime   GARLIC 9983 MG CAPS    Take by mouth daily.   GLIPIZIDE (GLUCOTROL) 5 MG  TABLET    Take by mouth 2 (two) times daily before a meal.   INSULIN GLARGINE (TOUJEO SOLOSTAR) 300 UNIT/ML SOPN    Inject 10 Units into the skin daily.   LEVOTHYROXINE (SYNTHROID, LEVOTHROID) 112 MCG TABLET    Take 112 mcg by mouth daily before breakfast.   LIDOCAINE-PRILOCAINE (EMLA) CREAM    Apply 1 application topically as needed. Apply to port then cover with saran wrap 1-2 hours before chemotherapy appointment   LISINOPRIL (PRINIVIL,ZESTRIL) 10 MG TABLET    Take 5 mg by mouth daily.    MAGIC MOUTHWASH SOLN    Take 10 mLs by mouth 4 (four) times daily as needed for mouth pain.   MONTELUKAST (SINGULAIR) 10 MG TABLET    Take 10 mg by mouth at bedtime.   MUPIROCIN CREAM (BACTROBAN) 2 %    Apply 1 application topically 2 (two) times daily.   OMEGA-3 FATTY ACIDS (FISH OIL) 1000 MG CAPS    Take by mouth 2 (two) times daily.   OXYCODONE-ACETAMINOPHEN (PERCOCET/ROXICET) 5-325 MG TABLET    Take 1 tablet by mouth every 8 (eight) hours as needed for moderate pain.   PRAVASTATIN (PRAVACHOL) 40 MG TABLET    Take 40 mg by mouth daily.  PROCHLORPERAZINE (COMPAZINE) 10 MG TABLET    Take 1 tablet (10 mg total) by mouth every 6 (six) hours as needed for nausea or vomiting.   RANITIDINE (ZANTAC) 150 MG TABLET    Take 150 mg by mouth daily. 1-2 tabs daily   SILVER SULFADIAZINE (SILVADENE) 1 % CREAM    Apply 1 application topically 2 (two) times daily. to affected area   VITAMIN B-12 (CYANOCOBALAMIN) 1000 MCG TABLET    Inject 1,000 mcg into the muscle every 30 (thirty) days.    Review of Systems  Constitutional: Positive for fatigue. Negative for fever, chills and appetite change.  Respiratory: Negative for chest tightness, shortness of breath and wheezing.   Cardiovascular: Negative for chest pain and palpitations.  Gastrointestinal: Negative for nausea, vomiting and abdominal pain.  Genitourinary: Positive for decreased urine volume.  Skin: Positive for color change and wound.  Neurological: Positive for  weakness.    Social History  Substance Use Topics  . Smoking status: Never Smoker   . Smokeless tobacco: Never Used  . Alcohol Use: No   Objective:   BP 120/60 mmHg  Pulse 83  Temp(Src) 98.2 F (36.8 C) (Oral)  Resp 16  Wt 193 lb (87.544 kg)  SpO2 92%  Physical Exam   General Appearance:    Alert, cooperative, no distress  Eyes:    PERRL, conjunctiva/corneas clear, EOM's intact       Lungs:     Clear to auscultation bilaterally, respirations unlabored  Heart:    Regular rate and rhythm  Neurologic:   Awake, alert, oriented x 3. No apparent focal neurological           defect.   Skin:   Mild erythema of left foot. Healing 1/2 x 1cm healing ulceration left anterior leg, and 2 small area of erythema on right.        Assessment & Plan:     1. Need for influenza vaccination  - Flu vaccine HIGH DOSE PF (Fluzone High dose)  2. Skin ulcer, with unspecified severity ([place]) Continue BID dressing changes. Is steadily healing. Will recheck in 2 weeks.  - oxyCODONE-acetaminophen (PERCOCET/ROXICET) 5-325 MG tablet; Take 1 tablet by mouth every 8 (eight) hours as needed for moderate pain.  Dispense: 30 tablet; Refill: 0  3. Diabetes mellitus with neurological manifestations, uncontrolled ([place]) Stable. May reduce Lantus further if fasting sugars remain below 100  4. Cellulitis of left lower extremity Steadily improving - doxycycline (VIBRA-TABS) 100 MG tablet; Take 1 tablet (100 mg total) by mouth 2 (two) times daily.  Dispense: 20 tablet; Refill: 0  5. Malignant neoplasm of tail of pancreas (HCC) Stopped chemotherapies. Long discussion regarding proper nutrition to improve general well-being. He seems to have much better appetite and is eating and drinking better since stopping chemotherapy. Consider hospice referral. Will discuss further at follow up appointment.        Lelon Huh, MD  [facility]

## 2015-07-21 ENCOUNTER — Other Ambulatory Visit: Payer: Self-pay | Admitting: Family Medicine

## 2015-07-29 ENCOUNTER — Telehealth: Payer: Self-pay | Admitting: Family Medicine

## 2015-07-29 MED ORDER — FENTANYL 50 MCG/HR TD PT72: 50.0000 ug | MEDICATED_PATCH | TRANSDERMAL | Status: DC

## 2015-07-29 NOTE — Telephone Encounter (Signed)
Pt called to request a Rx for a pain patch instead of a pill. Pt states Dr Grayland Ormond had given him a Rx before.  Pt would like to pick up the Rx.  EI:[id number]

## 2015-07-29 NOTE — Telephone Encounter (Signed)
Have printed rx for fentanyl patch. He needs to stop oxycodone. Schedule o.v. For follow up in 3 weeks.

## 2015-07-29 NOTE — Telephone Encounter (Signed)
Please advise 

## 2015-07-30 NOTE — Telephone Encounter (Signed)
Patient's wife Leonia Reeves was notified.expressed understanding.

## 2015-08-01 ENCOUNTER — Encounter: Payer: Self-pay | Admitting: Family Medicine

## 2015-08-01 ENCOUNTER — Ambulatory Visit (INDEPENDENT_AMBULATORY_CARE_PROVIDER_SITE_OTHER): Payer: Medicare Other | Admitting: Family Medicine

## 2015-08-01 VITALS — BP 110/62 | HR 66 | Temp 98.3°F | Resp 16 | Wt 183.0 lb

## 2015-08-01 DIAGNOSIS — L03116 Cellulitis of left lower limb: Secondary | ICD-10-CM | POA: Diagnosis not present

## 2015-08-01 DIAGNOSIS — E08621 Diabetes mellitus due to underlying condition with foot ulcer: Secondary | ICD-10-CM | POA: Diagnosis not present

## 2015-08-01 DIAGNOSIS — L97529 Non-pressure chronic ulcer of other part of left foot with unspecified severity: Secondary | ICD-10-CM | POA: Diagnosis not present

## 2015-08-01 DIAGNOSIS — L97523 Non-pressure chronic ulcer of other part of left foot with necrosis of muscle: Secondary | ICD-10-CM | POA: Diagnosis not present

## 2015-08-01 DIAGNOSIS — I739 Peripheral vascular disease, unspecified: Secondary | ICD-10-CM | POA: Diagnosis not present

## 2015-08-01 DIAGNOSIS — R42 Dizziness and giddiness: Secondary | ICD-10-CM

## 2015-08-01 DIAGNOSIS — B351 Tinea unguium: Secondary | ICD-10-CM | POA: Diagnosis not present

## 2015-08-01 LAB — GLUCOSE, POCT (MANUAL RESULT ENTRY): POC Glucose: 182 mg/dl — AB (ref 70–99)

## 2015-08-01 MED ORDER — DOXYCYCLINE HYCLATE 100 MG PO CAPS: 100.0000 mg | ORAL_CAPSULE | Freq: Two times a day (BID) | ORAL | Status: AC

## 2015-08-01 NOTE — Progress Notes (Addendum)
Patient: [name] Male    DOB: 16-Oct-1924   81 y.o.   MRN: [id number] Visit Date: 08/01/2015  Today's Provider: Lelon Huh, MD   Chief Complaint  Patient presents with  . Recurrent Skin Infections    2 week recheck  . Extremity Weakness   Subjective:    HPI  Cellutitis/ Skin Ulcer Recheck: Last office visit was 2 weeks ago. Management during that visit includes advising patient to continue twice daily dressing changes. Patient was also to continue Doxycycline. Patient states he has completed all doses of the Doxycyline 2 days ago. Patient has been changing dressings daily.   Weakness: Patient comes in today complaining of weakness and dizziness for 1 hour. Patient was placed in 2 L 02 in the office during check in quickly returned to baseline. He recently discontinued chemotherapy for pancreatic cancer and his son states they have initiated hospice referral.     Allergies  Allergen Reactions  . Adhesive [Tape] Other (See Comments)    Patient states that adhesive tape causes his skin to tear very easily and asks for paper tape ONLY  . Crestor [Rosuvastatin Calcium] Rash  . Penicillins Rash   Previous Medications   ALLOPURINOL (ZYLOPRIM) 100 MG TABLET    TAKE 2 TABLETS BY MOUTH EVERY MORNING   BUMETANIDE (BUMEX) 2 MG TABLET    Take 2 mg by mouth daily. 1 or 2 tablets AS NEEDED   CLOPIDOGREL (PLAVIX) 75 MG TABLET    Take 1 tablet by mouth  daily   DOXYCYCLINE (VIBRA-TABS) 100 MG TABLET    Take 1 tablet (100 mg total) by mouth 2 (two) times daily.   FENTANYL (DURAGESIC - DOSED MCG/HR) 50 MCG/HR    Place 1 patch (50 mcg total) onto the skin every 3 (three) days.   GABAPENTIN (NEURONTIN) 300 MG CAPSULE    Take 3 capsules by mouth at bedtime   GARLIC 123XX123 MG CAPS    Take by mouth daily.   GLIPIZIDE (GLUCOTROL) 5 MG TABLET    Take by mouth 2 (two) times daily before a meal.   INSULIN GLARGINE (TOUJEO SOLOSTAR) 300 UNIT/ML SOPN    Inject 10 Units into the skin daily.     LEVOTHYROXINE (SYNTHROID, LEVOTHROID) 112 MCG TABLET    Take 112 mcg by mouth daily before breakfast.   LIDOCAINE-PRILOCAINE (EMLA) CREAM    Apply 1 application topically as needed. Apply to port then cover with saran wrap 1-2 hours before chemotherapy appointment   LISINOPRIL (PRINIVIL,ZESTRIL) 10 MG TABLET    Take 5 mg by mouth daily.    MAGIC MOUTHWASH SOLN    Take 10 mLs by mouth 4 (four) times daily as needed for mouth pain.   MONTELUKAST (SINGULAIR) 10 MG TABLET    Take 10 mg by mouth at bedtime.   MUPIROCIN CREAM (BACTROBAN) 2 %    Apply 1 application topically 2 (two) times daily.   OMEGA-3 FATTY ACIDS (FISH OIL) 1000 MG CAPS    Take by mouth 2 (two) times daily.   PRAVASTATIN (PRAVACHOL) 40 MG TABLET    TAKE ONE TABLET BY MOUTH EVERY DAY   PROCHLORPERAZINE (COMPAZINE) 10 MG TABLET    Take 1 tablet (10 mg total) by mouth every 6 (six) hours as needed for nausea or vomiting.   RANITIDINE (ZANTAC) 150 MG TABLET    Take 150 mg by mouth daily. 1-2 tabs daily   SILVER SULFADIAZINE (SILVADENE) 1 % CREAM    Apply  1 application topically 2 (two) times daily. to affected area   VITAMIN B-12 (CYANOCOBALAMIN) 1000 MCG TABLET    Inject 1,000 mcg into the muscle every 30 (thirty) days.    Review of Systems  Constitutional: Positive for fatigue. Negative for fever, chills, diaphoresis and appetite change.  Respiratory: Negative for chest tightness, shortness of breath and wheezing.   Cardiovascular: Negative for chest pain and palpitations.  Gastrointestinal: Negative for nausea, vomiting and abdominal pain.  Neurological: Positive for dizziness and light-headedness.    Social History  Substance Use Topics  . Smoking status: Never Smoker   . Smokeless tobacco: Never Used  . Alcohol Use: No   Objective:   BP 110/62 mmHg  Pulse 66  Temp(Src) 98.3 F (36.8 C) (Oral)  Resp 16  Wt 183 lb (83.008 kg)  SpO2 96%  Physical Exam  General appearance: alert, well developed, well nourished,  cooperative and in no distress Head: Normocephalic, without obvious abnormality, atraumatic Lungs: CTA CV: RRR  Foot:  Mild erythema of left foot. Healing 1/2 x 1cm healing ulceration left anterior leg, and 2 small area of erythema on right.   Results for orders placed or performed in visit on 08/01/15  POCT Glucose (CBG)  Result Value Ref Range   POC Glucose 182 (A) 70 - 99 mg/dl       Assessment & Plan:     1. Dizziness Resolved after application of oxygen and sitting to rest. Prior to leaving office oxygen was turned off for 10 minutes and O2 saturation remained at 92%. Counseled to push clear liquids and eat solid foods as tolerated.  - EKG 12-Lead - POCT Glucose (CBG)  He states he has stopped lisinopril and was advised to stay off of this medication for the time being.   2. Cellulitis of left lower extremity Slowly improving.  - doxycycline (VIBRAMYCIN) 100 MG capsule; Take 1 capsule (100 mg total) by mouth 2 (two) times daily.  Dispense: 20 capsule; Refill: 0   3. Pancreatic cancer  Patient's family has initiated hospice referral.       Lelon Huh, MD  [facility]

## 2015-08-05 ENCOUNTER — Telehealth: Payer: Self-pay | Admitting: Family Medicine

## 2015-08-05 NOTE — Telephone Encounter (Signed)
Patient has only eaten a few bites of food in the last 2 days. Patient is weak,sleepy, has tremors,and confusion. Patient's son wants to know if pt should be seen in office. Please advise?

## 2015-08-05 NOTE — Telephone Encounter (Signed)
Patient's son Chip was notified. Chip stated that pt's BS was elevated yesterday to 220. Fasting BS is usually averaging under 100. Chip stated they will watch pt carefully tonight and if he's not getting better they go ahead and take him to Er.

## 2015-08-05 NOTE — Telephone Encounter (Signed)
Pt's son Chip would like to speak with a nurse about reactions that might be due to the pain patch that pt was using. Pt has lost interest in eating, confusion, excessive sleep, & lose of muscle control. Chip has taken the patch off the pt and wasn't sure if pt should come in for OV. Pt's son would also like to move forward with Hospice coming to the home to check in on pt. Please advise. Thanks TNP

## 2015-08-05 NOTE — Telephone Encounter (Signed)
It may help to take off the pain patch for a few days. He should also stop his blood pressure pill (lisinopril) and gabapentin for the time being. Should check his sugar to make sure it is not too high or low. There's not really anything we could do for him in the office.  I signed off on orders for hospice this morning. He should call them to make sure they got orders and schedule a time to come out. If he gets really bad they could take to er for IV fluids.

## 2015-08-06 ENCOUNTER — Emergency Department
Admission: EM | Admit: 2015-08-06 | Discharge: 2015-08-06 | Disposition: A | Discharge status: Home / Self Care | Payer: Medicare Other | Attending: Student | Admitting: Student

## 2015-08-06 ENCOUNTER — Encounter: Payer: Self-pay | Admitting: *Deleted

## 2015-08-06 ENCOUNTER — Emergency Department: Payer: Medicare Other

## 2015-08-06 DIAGNOSIS — Y9289 Other specified places as the place of occurrence of the external cause: Secondary | ICD-10-CM | POA: Diagnosis not present

## 2015-08-06 DIAGNOSIS — W1839XA Other fall on same level, initial encounter: Secondary | ICD-10-CM | POA: Insufficient documentation

## 2015-08-06 DIAGNOSIS — Z794 Long term (current) use of insulin: Secondary | ICD-10-CM | POA: Insufficient documentation

## 2015-08-06 DIAGNOSIS — N183 Chronic kidney disease, stage 3 (moderate): Secondary | ICD-10-CM | POA: Insufficient documentation

## 2015-08-06 DIAGNOSIS — R0781 Pleurodynia: Secondary | ICD-10-CM

## 2015-08-06 DIAGNOSIS — Z880 Allergy status to penicillin: Secondary | ICD-10-CM | POA: Insufficient documentation

## 2015-08-06 DIAGNOSIS — Z7902 Long term (current) use of antithrombotics/antiplatelets: Secondary | ICD-10-CM | POA: Diagnosis not present

## 2015-08-06 DIAGNOSIS — Y9389 Activity, other specified: Secondary | ICD-10-CM | POA: Diagnosis not present

## 2015-08-06 DIAGNOSIS — R531 Weakness: Secondary | ICD-10-CM | POA: Diagnosis not present

## 2015-08-06 DIAGNOSIS — E1149 Type 2 diabetes mellitus with other diabetic neurological complication: Secondary | ICD-10-CM | POA: Insufficient documentation

## 2015-08-06 DIAGNOSIS — S299XXA Unspecified injury of thorax, initial encounter: Secondary | ICD-10-CM | POA: Insufficient documentation

## 2015-08-06 DIAGNOSIS — Z792 Long term (current) use of antibiotics: Secondary | ICD-10-CM | POA: Insufficient documentation

## 2015-08-06 DIAGNOSIS — Y998 Other external cause status: Secondary | ICD-10-CM | POA: Diagnosis not present

## 2015-08-06 DIAGNOSIS — Z79891 Long term (current) use of opiate analgesic: Secondary | ICD-10-CM | POA: Diagnosis not present

## 2015-08-06 DIAGNOSIS — S20212A Contusion of left front wall of thorax, initial encounter: Secondary | ICD-10-CM | POA: Insufficient documentation

## 2015-08-06 DIAGNOSIS — Z7984 Long term (current) use of oral hypoglycemic drugs: Secondary | ICD-10-CM | POA: Diagnosis not present

## 2015-08-06 DIAGNOSIS — Z79899 Other long term (current) drug therapy: Secondary | ICD-10-CM | POA: Diagnosis not present

## 2015-08-06 DIAGNOSIS — I129 Hypertensive chronic kidney disease with stage 1 through stage 4 chronic kidney disease, or unspecified chronic kidney disease: Secondary | ICD-10-CM | POA: Insufficient documentation

## 2015-08-06 DIAGNOSIS — R079 Chest pain, unspecified: Secondary | ICD-10-CM | POA: Diagnosis not present

## 2015-08-06 LAB — CBC WITH DIFFERENTIAL/PLATELET
BASOS ABS: 0 10*3/uL (ref 0–0.1)
Eosinophils Absolute: 0 10*3/uL (ref 0–0.7)
HCT: 47.5 % (ref 40.0–52.0)
Hemoglobin: 15.3 g/dL (ref 13.0–18.0)
Lymphs Abs: 0.3 10*3/uL — ABNORMAL LOW (ref 1.0–3.6)
MCH: 32.5 pg (ref 26.0–34.0)
MCHC: 32.1 g/dL (ref 32.0–36.0)
MCV: 101.2 fL — AB (ref 80.0–100.0)
Monocytes Absolute: 0.6 10*3/uL (ref 0.2–1.0)
Monocytes Relative: 9 %
NEUTROS ABS: 5.8 10*3/uL (ref 1.4–6.5)
Neutrophils Relative %: 87 %
PLATELETS: 99 10*3/uL — AB (ref 150–440)
RBC: 4.7 MIL/uL (ref 4.40–5.90)
RDW: 19.3 % — ABNORMAL HIGH (ref 11.5–14.5)
WBC: 6.7 10*3/uL (ref 3.8–10.6)

## 2015-08-06 LAB — URINALYSIS COMPLETE WITH MICROSCOPIC (ARMC ONLY)
BACTERIA UA: NONE SEEN
BILIRUBIN URINE: NEGATIVE
GLUCOSE, UA: NEGATIVE mg/dL
HGB URINE DIPSTICK: NEGATIVE
KETONES UR: NEGATIVE mg/dL
LEUKOCYTES UA: NEGATIVE
NITRITE: NEGATIVE
PH: 7 (ref 5.0–8.0)
PROTEIN: NEGATIVE mg/dL
SPECIFIC GRAVITY, URINE: 1.008 (ref 1.005–1.030)
WBC UA: NONE SEEN WBC/hpf (ref 0–5)

## 2015-08-06 LAB — COMPREHENSIVE METABOLIC PANEL
ALBUMIN: 3.7 g/dL (ref 3.5–5.0)
ALT: 20 U/L (ref 17–63)
AST: 40 U/L (ref 15–41)
Alkaline Phosphatase: 133 U/L — ABNORMAL HIGH (ref 38–126)
Anion gap: 10 (ref 5–15)
BUN: 18 mg/dL (ref 6–20)
CHLORIDE: 86 mmol/L — AB (ref 101–111)
CO2: 36 mmol/L — AB (ref 22–32)
CREATININE: 1.37 mg/dL — AB (ref 0.61–1.24)
Calcium: 9.2 mg/dL (ref 8.9–10.3)
GFR calc Af Amer: 51 mL/min — ABNORMAL LOW (ref >60)
GFR, EST NON AFRICAN AMERICAN: 44 mL/min — AB (ref >60)
GLUCOSE: 185 mg/dL — AB (ref 65–99)
POTASSIUM: 3.4 mmol/L — AB (ref 3.5–5.1)
Sodium: 132 mmol/L — ABNORMAL LOW (ref 135–145)
Total Bilirubin: 3 mg/dL — ABNORMAL HIGH (ref 0.3–1.2)
Total Protein: 6.8 g/dL (ref 6.5–8.1)

## 2015-08-06 LAB — LIPASE, BLOOD: LIPASE: 59 U/L — AB (ref 11–51)

## 2015-08-06 LAB — TROPONIN I: Troponin I: 0.15 ng/mL — ABNORMAL HIGH (ref <0.031)

## 2015-08-06 MED ORDER — SODIUM CHLORIDE 0.9 % IV BOLUS (SEPSIS)
500.0000 mL | Freq: Once | INTRAVENOUS | Status: AC
  Administered 2015-08-06: 500 mL via INTRAVENOUS

## 2015-08-06 NOTE — ED Notes (Signed)
Unable to log onto chl on multiple computers for at least 15 minutes. Pt discharged without signing. Paperwork, including written order for blood work given to son.

## 2015-08-06 NOTE — ED Provider Notes (Signed)
[facility] Emergency Department Provider Note  ____________________________________________  Time seen: Approximately 10:55 AM  I have reviewed the triage vital signs and the nursing notes.   HISTORY  Chief Complaint Fall and Dehydration    HPI [name] is a 81 y.o. male with diabetes, hypertension, end-stage pancreatic cancer with all treatments recently discontinued due to poor prognosis (now being evaluated by hospice) who presents for evaluation of left rib pain which began suddenly 4 nights ago after he fell onto the left side, pain has been constant since onset, is worse with movement, currently moderate. The patient reports that fentanyl patches were applied to his body for the first time 4 days ago for treatment of pain. He reports that they made him hallucinate, he was dizzy with standing, he had dizziness which caused him to fall onto the left side. He has since discontinued the fentanyl patches and reports that he no longer feels dizzy or is hallucinating but has had persistent left rib pain since the fall. His primary care doctor sent him to the emergency department for evaluation for possible rib fracture and for IV fluids for possible dehydration. He denies any other pain complaints at this time. No vomiting, diarrhea, fevers or chills.   Past Medical History  Diagnosis Date  . Diabetes mellitus without complication ([place])   . Hypertension   . PAD (peripheral artery disease) ([place])   . Gout   . Hypothyroidism   . History of chicken pox   . History of measles as a child   . History of mumps as a child   . SSS (sick sinus syndrome) ([place])   . Chronic kidney disease     ckd  . GERD (gastroesophageal reflux disease)   . Neuropathy ([place])   . Dysrhythmia     a fib  . Cancer [facility])     Skin cancer  . Pancreatic mass 04/2015    scheduled for biopsy on 05/08/2015  . Presence of permanent cardiac pacemaker     Patient Active Problem List    Diagnosis Date Noted  . Cellulitis 06/27/2015  . Pancreatic cancer ([place]) 05/19/2015  . Abdominal pain 05/08/2015  . Status post cardiac pacemaker procedure 04/30/2015  . Bradycardia 04/10/2015  . Dizziness 04/10/2015  . Arthritis 04/09/2015  . Bilateral carotid artery disease ([place]) 04/09/2015  . Chronic kidney disease (CKD), stage III (moderate) 04/09/2015  . Edema 04/09/2015  . History of pancreatitis 04/09/2015  . Irritable colon 04/09/2015  . Onychomycosis 04/09/2015  . Skin ulcer ([place]) 04/09/2015  . Testicular mass 04/09/2015  . Neuropathy ([place]) 02/17/2010  . LBP (low back pain) 05/20/2009  . Gouty arthropathy 01/01/2009  . Adult hypothyroidism 01/01/2009  . Pernicious anemia 12/27/2008  . B-complex deficiency 12/27/2008  . Congestive heart failure ([place]) 12/27/2008  . Esophageal reflux 12/27/2008  . Apnea, sleep 12/27/2008  . Diabetes mellitus with neurological manifestations, uncontrolled ([place]) 09/13/2008  . Atrial fibrillation ([place]) 09/14/2007  . Peripheral vascular disease ([place]) 09/14/2007  . Polycythemia, secondary 09/14/2007  . Essential (primary) hypertension 01/31/2007  . Hypercholesteremia 01/31/2007    Past Surgical History  Procedure Laterality Date  . Carotid endarterectomy Right 2015    Dr. Lucky Cowboy  . Heart stents  2015    one stent per patient  . Bilateral leg stents Bilateral 2015    Bilateral iliac and right popliteal  . Skin cancer excision    . Cataract extraction Bilateral 09/2013  . Angioplasty  2009, 2010    done  by Dr. Hulda Humphrey and Dr. Dian Situ  . Eye surgery    . Coronary angioplasty  2015  . Appendectomy  1962  . Pacemaker insertion N/A 04/30/2015    Procedure: INSERTION PACEMAKER;  Surgeon: Isaias Cowman, MD;  Location: [facility] ORS;  Service: Cardiovascular;  Laterality: N/A;  . Upper esophageal endoscopic ultrasound (eus) N/A 05/08/2015    Procedure: UPPER ESOPHAGEAL ENDOSCOPIC ULTRASOUND (EUS);  Surgeon: Cora Daniels, MD;  Location: [facility]  ENDOSCOPY;  Service: Endoscopy;  Laterality: N/A;  . Peripheral vascular catheterization N/A 06/04/2015    Procedure: Porta Cath Insertion;  Surgeon: [name], MD;  Location: [facility] CV LAB;  Service: Cardiovascular;  Laterality: N/A;    Current Outpatient Rx  Name  Route  Sig  Dispense  Refill  . allopurinol (ZYLOPRIM) 100 MG tablet      TAKE 2 TABLETS BY MOUTH EVERY MORNING   60 tablet   11   . bumetanide (BUMEX) 2 MG tablet   Oral   Take 2-4 mg by mouth daily as needed (for swelling).          . clopidogrel (PLAVIX) 75 MG tablet      Take 1 tablet by mouth  daily   90 tablet   1   . collagenase (SANTYL) ointment   Topical   Apply 1 application topically daily. For 10 days         . cyanocobalamin (,VITAMIN B-12,) 1000 MCG/ML injection   Intramuscular   Inject 1,000 mcg into the muscle every 30 (thirty) days.         Marland Kitchen doxycycline (VIBRAMYCIN) 100 MG capsule   Oral   Take 1 capsule (100 mg total) by mouth 2 (two) times daily.   20 capsule   0   . fentaNYL (DURAGESIC - DOSED MCG/HR) 50 MCG/HR   Transdermal   Place 1 patch (50 mcg total) onto the skin every 3 (three) days.   10 patch   0   . gabapentin (NEURONTIN) 300 MG capsule      Take 3 capsules by mouth at bedtime   270 capsule   1   . Garlic 123XX123 MG CAPS   Oral   Take 1,000 mg by mouth daily.          Marland Kitchen glipiZIDE (GLUCOTROL XL) 5 MG 24 hr tablet   Oral   Take 5 mg by mouth 2 (two) times daily.         . insulin glargine (LANTUS) 100 UNIT/ML injection   Subcutaneous   Inject 10 Units into the skin at bedtime as needed (for high blood sugar).         Marland Kitchen levothyroxine (SYNTHROID, LEVOTHROID) 112 MCG tablet   Oral   Take 112 mcg by mouth daily before breakfast.         . lisinopril (PRINIVIL,ZESTRIL) 10 MG tablet   Oral   Take 5 mg by mouth daily.          . montelukast (SINGULAIR) 10 MG tablet   Oral   Take 10 mg by mouth at bedtime.         . Omega-3 Fatty Acids  (FISH OIL) 1000 MG CAPS   Oral   Take 1,000 mg by mouth 2 (two) times daily.          . pravastatin (PRAVACHOL) 40 MG tablet      TAKE ONE TABLET BY MOUTH EVERY DAY Patient taking differently: TAKE ONE TABLET BY MOUTH DAILY AT BEDTIME  90 tablet   1   . ranitidine (ZANTAC) 150 MG tablet   Oral   Take 150-300 mg by mouth daily.          . silver sulfADIAZINE (SILVADENE) 1 % cream   Topical   Apply 1 application topically 2 (two) times daily.          Marland Kitchen lidocaine-prilocaine (EMLA) cream   Topical   Apply 1 application topically as needed. Apply to port then cover with saran wrap 1-2 hours before chemotherapy appointment Patient not taking: Reported on 08/06/2015   30 g   2   . magic mouthwash SOLN   Oral   Take 10 mLs by mouth 4 (four) times daily as needed for mouth pain. Patient not taking: Reported on 08/06/2015   300 mL   0   . mupirocin cream (BACTROBAN) 2 %   Topical   Apply 1 application topically 2 (two) times daily. Patient not taking: Reported on 08/06/2015   15 g   0   . prochlorperazine (COMPAZINE) 10 MG tablet   Oral   Take 1 tablet (10 mg total) by mouth every 6 (six) hours as needed for nausea or vomiting. Patient not taking: Reported on 08/06/2015   30 tablet   2     Allergies Adhesive; Crestor; and Penicillins  Family History  Problem Relation Age of Onset  . Diabetes Mother   . Cancer Sister     Uterine  . Arthritis Father   . Kidney disease Father     Social History Social History  Substance Use Topics  . Smoking status: Never Smoker   . Smokeless tobacco: Never Used  . Alcohol Use: No    Review of Systems Constitutional: No fever/chills Eyes: No visual changes. ENT: No sore throat. Cardiovascular: + chest pain. Respiratory: Denies shortness of breath. Gastrointestinal: No abdominal pain.  No nausea, no vomiting.  No diarrhea.  No constipation. Genitourinary: Negative for dysuria. Musculoskeletal: Negative for back  pain. Skin: Negative for rash. Neurological: Negative for headaches, focal weakness or numbness.  10-point ROS otherwise negative.  ____________________________________________   PHYSICAL EXAM:  VITAL SIGNS: ED Triage Vitals  Enc Vitals Group     BP 08/06/15 1035 136/57 mmHg     Pulse Rate 08/06/15 1035 101     Resp 08/06/15 1035 20     Temp 08/06/15 1037 97.4 F (36.3 C)     Temp src --      SpO2 08/06/15 1035 97 %     Weight 08/06/15 1035 182 lb (82.555 kg)     Height 08/06/15 1035 6' (1.829 m)     Head Cir --      Peak Flow --      Pain Score 08/06/15 1038 5     Pain Loc --      Pain Edu? --      Excl. in [place]? --     Constitutional: Alert and oriented. Well appearing and in no acute distress. Eyes: Conjunctivae are normal. PERRL. EOMI. Head: Atraumatic. Nose: No congestion/rhinnorhea. Mouth/Throat: Mucous membranes are moist.  Oropharynx non-erythematous. Neck: No stridor.   Cardiovascular: Normal rate, regular rhythm. Grossly normal heart sounds.  Good peripheral circulation. Respiratory: Normal respiratory effort.  No retractions. Lungs CTAB. Gastrointestinal: Soft and nontender. No distention.  No CVA tenderness. Genitourinary: deferred Musculoskeletal: No lower extremity tenderness nor edema.  No joint effusions. Tenderness with bruising in the left anterolateral chest wall. Neurologic:  Normal speech and language. No gross  focal neurologic deficits are appreciated. 5 out of 5 strength in bilateral upper and lower extremities, sensation intact to light touch throughout. Skin:  Skin is warm, dry and intact. No rash noted. Psychiatric: Mood and affect are normal. Speech and behavior are normal.  ____________________________________________   LABS (all labs ordered are listed, but only abnormal results are displayed)  Labs Reviewed  CBC WITH DIFFERENTIAL/PLATELET - Abnormal; Notable for the following:    MCV 101.2 (*)    RDW 19.3 (*)    Platelets 99 (*)     Lymphs Abs 0.3 (*)    All other components within normal limits  COMPREHENSIVE METABOLIC PANEL - Abnormal; Notable for the following:    Sodium 132 (*)    Potassium 3.4 (*)    Chloride 86 (*)    CO2 36 (*)    Glucose, Bld 185 (*)    Creatinine, Ser 1.37 (*)    Alkaline Phosphatase 133 (*)    Total Bilirubin 3.0 (*)    GFR calc non Af Amer 44 (*)    GFR calc Af Amer 51 (*)    All other components within normal limits  LIPASE, BLOOD - Abnormal; Notable for the following:    Lipase 59 (*)    All other components within normal limits  TROPONIN I - Abnormal; Notable for the following:    Troponin I 0.15 (*)    All other components within normal limits  URINALYSIS COMPLETEWITH MICROSCOPIC ([facility] ONLY) - Abnormal; Notable for the following:    Color, Urine YELLOW (*)    APPearance CLEAR (*)    Squamous Epithelial / LPF 0-5 (*)    All other components within normal limits   ____________________________________________  EKG  ED ECG REPORT I, Joanne Gavel, the attending physician, personally viewed and interpreted this ECG.   Date: 08/06/2015  EKG Time: 10:46  Rate: 70  Rhythm: Ventricular paced rhythm with frequent premature ventricular complexes  ____________________________________________  RADIOLOGY  CXR  IMPRESSION: Stable mild bibasilar scarring. No acute findings.  ____________________________________________   PROCEDURES  Procedure(s) performed: None  Critical Care performed: No  ____________________________________________   INITIAL IMPRESSION / ASSESSMENT AND PLAN / ED COURSE  Pertinent labs & imaging results that were available during my care of the patient were reviewed by me and considered in my medical decision making (see chart for details).  [name] is a 81 y.o. male with diabetes, hypertension, end-stage pancreatic cancer with all treatments recently discontinued due to poor prognosis (now being evaluated by hospice) who presents for  evaluation of left rib pain which began suddenly 4 nights ago after he fell onto the left side. On exam, he is very well-appearing and in no acute distress. Vital signs stable and he is afebrile. I suspect his initial heart rate of 101 was document erroneously as just 2 minutes later it had normalized on recheck. He appears well and appears very well hydrated. He does have some mild tenderness and bruising throughout the left anterior chest wall. He has an intact neurological examination and no other pain complaints. Plan for screening labs, we'll give IV fluids and check a chest x-ray to evaluate for any evidence of fracture. Reassess for disposition.  ----------------------------------------- 1:48 PM on 08/06/2015 -----------------------------------------  Patient continues to appear well. Vital signs stable. Labs reviewed. Lipase mildly elevated at 59 which appears chronic. CMP with mild hyponatremia and mild creatinine elevation as well as elevation of alkaline phosphatase and T bili all of which appear chronic.  Urinalysis is not consistent with urinary tract infection. Troponin is elevated at 0.15. The patient has chronic troponin elevation which seems to hover between 0.07 and 0.09. Today his troponin is slightly increased from prior. He denies any chest pain or difficulty breathing. Discussed this with the patient, his family as well as Dr. Caryn Section. As it is unlikely that the patient will undergo any invasive intervention for troponin elevation as he has a terminal illness and is entering hospice care, Dr. Caryn Section agrees we will trend the value and attempt to avoid admission. The patient will be discharged home today, hospice will see  him later this afternoon. I discussed the case with them and they will withdraw the troponin which Dr. Caryn Section will follow-up. The patient as well as his son at bedside are comfortable with the discharge plan. DC home. Chest x-ray shows no evidence of  fracture. ____________________________________________   FINAL CLINICAL IMPRESSION(S) / ED DIAGNOSES  Final diagnoses:  Rib pain on left side  Weakness generalized      Joanne Gavel, MD 08/06/15 1545

## 2015-08-06 NOTE — ED Notes (Addendum)
Pt reports falling Saturday night, pt is being treated for pancreatic cancer, pt reports feeling dehydrated, pt reports landing on left side, pt reports left rib pain, Dr Caryn Section sent pt to ER, pt reports decreased appetite

## 2015-08-10 NOTE — Addendum Note (Signed)
Addended by: Lelon Huh E on: 08/10/2015 07:30 AM   Modules accepted: Orders, Medications

## 2015-08-20 ENCOUNTER — Encounter: Payer: Self-pay | Admitting: Family Medicine

## 2015-08-20 DIAGNOSIS — E119 Type 2 diabetes mellitus without complications: Secondary | ICD-10-CM | POA: Diagnosis not present

## 2015-08-20 DIAGNOSIS — L89151 Pressure ulcer of sacral region, stage 1: Secondary | ICD-10-CM | POA: Diagnosis not present

## 2015-08-20 DIAGNOSIS — K5790 Diverticulosis of intestine, part unspecified, without perforation or abscess without bleeding: Secondary | ICD-10-CM | POA: Diagnosis not present

## 2015-08-20 DIAGNOSIS — C252 Malignant neoplasm of tail of pancreas: Secondary | ICD-10-CM | POA: Diagnosis not present

## 2015-08-21 ENCOUNTER — Other Ambulatory Visit: Payer: Self-pay | Admitting: Family Medicine

## 2015-08-21 DIAGNOSIS — L97523 Non-pressure chronic ulcer of other part of left foot with necrosis of muscle: Secondary | ICD-10-CM | POA: Diagnosis not present

## 2015-08-21 DIAGNOSIS — L97529 Non-pressure chronic ulcer of other part of left foot with unspecified severity: Secondary | ICD-10-CM | POA: Diagnosis not present

## 2015-08-21 DIAGNOSIS — L97519 Non-pressure chronic ulcer of other part of right foot with unspecified severity: Secondary | ICD-10-CM | POA: Diagnosis not present

## 2015-08-21 DIAGNOSIS — E08621 Diabetes mellitus due to underlying condition with foot ulcer: Secondary | ICD-10-CM | POA: Diagnosis not present

## 2015-08-21 DIAGNOSIS — I739 Peripheral vascular disease, unspecified: Secondary | ICD-10-CM | POA: Diagnosis not present

## 2015-08-22 ENCOUNTER — Other Ambulatory Visit: Payer: Self-pay | Admitting: Family Medicine

## 2015-08-22 ENCOUNTER — Telehealth: Payer: Self-pay

## 2015-08-22 NOTE — Telephone Encounter (Signed)
[name] advised of verbal order. [name] wants to know if it is ok for them to flush the Portacath every 6 weeks to keep it open and patent? Patient was getting this done at the cancer center but now he prefers to get it done at home.

## 2015-08-22 NOTE — Telephone Encounter (Signed)
Ok to flush portacath

## 2015-08-22 NOTE — Telephone Encounter (Signed)
[name], from [facility] called requesting a verbal order to flush patients portacath. Please call [name] with Verbal order if ok. [phone]

## 2015-08-25 DIAGNOSIS — K5790 Diverticulosis of intestine, part unspecified, without perforation or abscess without bleeding: Secondary | ICD-10-CM | POA: Diagnosis not present

## 2015-08-25 DIAGNOSIS — C252 Malignant neoplasm of tail of pancreas: Secondary | ICD-10-CM | POA: Diagnosis not present

## 2015-08-25 DIAGNOSIS — L89151 Pressure ulcer of sacral region, stage 1: Secondary | ICD-10-CM | POA: Diagnosis not present

## 2015-08-25 DIAGNOSIS — E119 Type 2 diabetes mellitus without complications: Secondary | ICD-10-CM | POA: Diagnosis not present

## 2015-08-26 NOTE — Telephone Encounter (Signed)
L/M for [name] giving verbal ok from Dr. Caryn Section to flush portacath every 6 weeks.

## 2015-08-26 NOTE — Telephone Encounter (Signed)
That's fine

## 2015-08-28 ENCOUNTER — Other Ambulatory Visit: Payer: Self-pay | Admitting: Family Medicine

## 2015-08-28 NOTE — Telephone Encounter (Signed)
Needs Percocet 5/325 mg. Refill 1 po q 8h prn.  Needs to be written on RX  "for hospice patient"  And faxed to [facility] on [address].  Also Mr. [name] is being seen here tomorrow.  [name] needs his OV note faxed to her at [phone].

## 2015-08-29 ENCOUNTER — Ambulatory Visit: Payer: Self-pay | Admitting: Family Medicine

## 2015-08-29 ENCOUNTER — Ambulatory Visit (INDEPENDENT_AMBULATORY_CARE_PROVIDER_SITE_OTHER): Payer: Medicare Other | Admitting: Family Medicine

## 2015-08-29 ENCOUNTER — Encounter: Payer: Self-pay | Admitting: Family Medicine

## 2015-08-29 VITALS — BP 90/44 | HR 68 | Temp 98.4°F | Resp 16 | Wt 181.0 lb

## 2015-08-29 DIAGNOSIS — I70245 Atherosclerosis of native arteries of left leg with ulceration of other part of foot: Secondary | ICD-10-CM | POA: Diagnosis not present

## 2015-08-29 DIAGNOSIS — M545 Low back pain: Secondary | ICD-10-CM | POA: Diagnosis not present

## 2015-08-29 DIAGNOSIS — I10 Essential (primary) hypertension: Secondary | ICD-10-CM

## 2015-08-29 DIAGNOSIS — C252 Malignant neoplasm of tail of pancreas: Secondary | ICD-10-CM | POA: Diagnosis not present

## 2015-08-29 DIAGNOSIS — E1149 Type 2 diabetes mellitus with other diabetic neurological complication: Secondary | ICD-10-CM | POA: Diagnosis not present

## 2015-08-29 DIAGNOSIS — IMO0002 Reserved for concepts with insufficient information to code with codable children: Secondary | ICD-10-CM

## 2015-08-29 DIAGNOSIS — E1165 Type 2 diabetes mellitus with hyperglycemia: Secondary | ICD-10-CM

## 2015-08-29 DIAGNOSIS — I6523 Occlusion and stenosis of bilateral carotid arteries: Secondary | ICD-10-CM | POA: Diagnosis not present

## 2015-08-29 DIAGNOSIS — E785 Hyperlipidemia, unspecified: Secondary | ICD-10-CM | POA: Diagnosis not present

## 2015-08-29 MED ORDER — MUPIROCIN CALCIUM 2 % EX CREA: 1.0000 "application " | TOPICAL_CREAM | Freq: Two times a day (BID) | CUTANEOUS | Status: DC

## 2015-08-29 MED ORDER — OXYCODONE-ACETAMINOPHEN 5-325 MG PO TABS: 1.0000 | ORAL_TABLET | Freq: Three times a day (TID) | ORAL | Status: DC | PRN

## 2015-08-29 NOTE — Progress Notes (Signed)
Patient: [name] Male    DOB: Jul 13, 1925   81 y.o.   MRN: [id number] Visit Date: 08/29/2015  Today's Provider: Lelon Huh, MD   Chief Complaint  Patient presents with  . Follow-up  . Cellulitis   Subjective:    HPI  Follow up pancreatic cancer. Is now under home hospice care. He had severe change in mental status with Fentanyl patch, but has since been taking Percocet which is working well. He is having no abdominal pain. Is eating much less, but no nausea or vomiting  He has sore on his left lateral lower leg that he noticed this morning. He had been on doxycycline for cellulitis which he has finished.  He states he has follow up this afternoon with Dr. Lucky Cowboy and is followed by Dr. Vickki Muff every 2-3 weeks.   For diabetes, he has stopped insulin and glipizide due to low blood sugars. Since stopping these his fasting sugars have been running 90-100, and his post prandials in the low 100s.   He has stopped taking lisinopril as he blood pressure was running low and he was feeling a bit dizzy, which has improved since stopping medication.   Wt Readings from Last 3 Encounters:  08/29/15 181 lb (82.101 kg)  08/06/15 182 lb (82.555 kg)  08/01/15 183 lb (83.008 kg)    Allergies  Allergen Reactions  . Adhesive [Tape] Other (See Comments)    Patient states that adhesive tape causes his skin to tear very easily and asks for paper tape ONLY  . Crestor [Rosuvastatin Calcium] Rash  . Penicillins Rash   Previous Medications   ALLOPURINOL (ZYLOPRIM) 100 MG TABLET    TAKE 2 TABLETS BY MOUTH EVERY MORNING   BUMETANIDE (BUMEX) 2 MG TABLET    Take 2-4 mg by mouth daily as needed (for swelling).    CLOPIDOGREL (PLAVIX) 75 MG TABLET    Take 1 tablet by mouth  daily   COLLAGENASE (SANTYL) OINTMENT    Apply 1 application topically daily. For 10 days   CYANOCOBALAMIN (,VITAMIN B-12,) 1000 MCG/ML INJECTION    Inject 1,000 mcg into the muscle every 30 (thirty) days.   GABAPENTIN  (NEURONTIN) 300 MG CAPSULE    Take 3 capsules by mouth at bedtime   GARLIC 123XX123 MG CAPS    Take 1,000 mg by mouth daily.    LEVOTHYROXINE (SYNTHROID, LEVOTHROID) 112 MCG TABLET    TAKE 1 TABLET BY MOUTH EVERY DAY   LIDOCAINE-PRILOCAINE (EMLA) CREAM    Apply 1 application topically as needed. Apply to port then cover with saran wrap 1-2 hours before chemotherapy appointment   MAGIC MOUTHWASH SOLN    Take 10 mLs by mouth 4 (four) times daily as needed for mouth pain.   MONTELUKAST (SINGULAIR) 10 MG TABLET    Take 10 mg by mouth at bedtime.   OMEGA-3 FATTY ACIDS (FISH OIL) 1000 MG CAPS    Take 1,000 mg by mouth 2 (two) times daily.    ONE TOUCH ULTRA TEST TEST STRIP    USE TO CHECK BLOOD SUGAR ONCE DAILY   OXYCODONE-ACETAMINOPHEN (ROXICET) 5-325 MG TABLET    Take 1 tablet by mouth every 8 (eight) hours as needed for severe pain. HOSPICE PATIENT   PRAVASTATIN (PRAVACHOL) 40 MG TABLET    TAKE ONE TABLET BY MOUTH EVERY DAY   PROCHLORPERAZINE (COMPAZINE) 10 MG TABLET    Take 1 tablet (10 mg total) by mouth every 6 (six) hours as needed for  nausea or vomiting.   RANITIDINE (ZANTAC) 150 MG TABLET    Take 150-300 mg by mouth daily.    SILVER SULFADIAZINE (SILVADENE) 1 % CREAM    Apply 1 application topically 2 (two) times daily.     Review of Systems  Constitutional: Negative for fever, chills and appetite change.  Respiratory: Negative for chest tightness, shortness of breath and wheezing.   Cardiovascular: Negative for chest pain and palpitations.  Gastrointestinal: Negative for nausea, vomiting and abdominal pain.  Neurological: Positive for light-headedness.    Social History  Substance Use Topics  . Smoking status: Never Smoker   . Smokeless tobacco: Never Used  . Alcohol Use: No   Objective:   BP 90/44 mmHg  Pulse 68  Temp(Src) 98.4 F (36.9 C) (Oral)  Resp 16  Wt 181 lb (82.101 kg)  Physical Exam   General Appearance:    Alert, cooperative, no distress  Eyes:    PERRL,  conjunctiva/corneas clear, EOM's intact       Lungs:     Clear to auscultation bilaterally, respirations unlabored  Heart:    Regular rate and rhythm  Neurologic:   Awake, alert, oriented x 3. No apparent focal neurological           defect.            Assessment & Plan:     1. Malignant neoplasm of tail of pancreas ([place]) Continue home hospice care and current pain menagement  2. Low back pain, unspecified back pain laterality, with sciatica presence unspecified Doing well with prn percocet which we will continue  3. Essential (primary) hypertension Off of ACEI. Consider reducing bumex if his oral intake declines  4. Diabetes mellitus with neurological manifestations, uncontrolled ([place]) Off of all diabetic medications. Continue to monitor home blood glucose and call if sugar get into the 200 range.    Follow up 6 weeks.       Lelon Huh, MD  [facility]

## 2015-09-01 DIAGNOSIS — I739 Peripheral vascular disease, unspecified: Secondary | ICD-10-CM | POA: Diagnosis not present

## 2015-09-01 DIAGNOSIS — I495 Sick sinus syndrome: Secondary | ICD-10-CM | POA: Diagnosis not present

## 2015-09-01 DIAGNOSIS — E7800 Pure hypercholesterolemia, unspecified: Secondary | ICD-10-CM | POA: Diagnosis not present

## 2015-09-01 DIAGNOSIS — E1159 Type 2 diabetes mellitus with other circulatory complications: Secondary | ICD-10-CM | POA: Diagnosis not present

## 2015-09-01 DIAGNOSIS — I10 Essential (primary) hypertension: Secondary | ICD-10-CM | POA: Diagnosis not present

## 2015-09-10 ENCOUNTER — Other Ambulatory Visit: Payer: Self-pay | Admitting: Family Medicine

## 2015-09-19 ENCOUNTER — Telehealth: Payer: Self-pay | Admitting: Emergency Medicine

## 2015-09-19 MED ORDER — OXYCODONE-ACETAMINOPHEN 5-325 MG PO TABS: 1.0000 | ORAL_TABLET | Freq: Three times a day (TID) | ORAL | Status: DC | PRN

## 2015-09-19 NOTE — Telephone Encounter (Signed)
[name] calling back to check on pt's refill on Percocet. Please return her call.  Thanks, CC

## 2015-09-19 NOTE — Telephone Encounter (Signed)
Med faxed. [name] informed.

## 2015-09-19 NOTE — Telephone Encounter (Signed)
Hospice called wanting a refill for Oxycodone-APAP 5/325mg  1-q8h PRN. He will not run out until Monday but afraid with the weather they may not be able to get him his refill. Thanks.   Pharmacy [phone] (must has Hospice pt written on script)

## 2015-09-26 ENCOUNTER — Telehealth: Payer: Self-pay | Admitting: Family Medicine

## 2015-09-26 MED ORDER — OXYCODONE-ACETAMINOPHEN 7.5-325 MG PO TABS: 1.0000 | ORAL_TABLET | ORAL | Status: DC | PRN

## 2015-09-26 NOTE — Telephone Encounter (Signed)
Please advise. Thanks.  

## 2015-09-26 NOTE — Telephone Encounter (Signed)
Faxed Rx into pharmacy. [name] with hospice advised.

## 2015-09-26 NOTE — Telephone Encounter (Signed)
Have new rx for 7.5mg  tablets and may take every 4 hours if needed

## 2015-09-26 NOTE — Telephone Encounter (Signed)
Pt has increase in pain over the last 2 weeks and has had break through pain in between taking the medication and when he is due for next dose. [name] would like to be advised of what to do for pt's pain and to see if pt's medication oxyCODONE-acetaminophen (ROXICET) 5-325 MG tablet needs to be increased. Please advise. Thanks TNP

## 2015-10-08 ENCOUNTER — Telehealth: Payer: Self-pay | Admitting: *Deleted

## 2015-10-08 NOTE — Telephone Encounter (Signed)
OK to stop scheduled doses of Bumex, but may still take it 'as needed' for swelling.

## 2015-10-08 NOTE — Telephone Encounter (Addendum)
[name] from [facility] called office stating that patient's blood pressure dropped down to 112/68 today, usually a normal range. Patient has moderate dizziness and is off unbalanced. [name] wanted to know if Dr. Caryn Section wants to take pt off of the bumex? Please advise?

## 2015-10-08 NOTE — Telephone Encounter (Signed)
Toni notified. 

## 2015-10-10 ENCOUNTER — Ambulatory Visit (INDEPENDENT_AMBULATORY_CARE_PROVIDER_SITE_OTHER): Payer: Medicare Other | Admitting: Family Medicine

## 2015-10-10 ENCOUNTER — Encounter: Payer: Self-pay | Admitting: Family Medicine

## 2015-10-10 VITALS — BP 132/66 | HR 75 | Temp 98.2°F | Resp 16 | Ht 72.0 in | Wt 179.0 lb

## 2015-10-10 DIAGNOSIS — C252 Malignant neoplasm of tail of pancreas: Secondary | ICD-10-CM | POA: Diagnosis not present

## 2015-10-10 DIAGNOSIS — E1149 Type 2 diabetes mellitus with other diabetic neurological complication: Secondary | ICD-10-CM

## 2015-10-10 DIAGNOSIS — I10 Essential (primary) hypertension: Secondary | ICD-10-CM

## 2015-10-10 LAB — POCT GLYCOSYLATED HEMOGLOBIN (HGB A1C)
ESTIMATED AVERAGE GLUCOSE: 183
Hemoglobin A1C: 8

## 2015-10-10 NOTE — Progress Notes (Signed)
Patient: [name] Male    DOB: 12-25-1924   82 y.o.   MRN: [id number] Visit Date: 10/10/2015  Today's Provider: Lelon Huh, MD   Chief Complaint  Patient presents with  . Hypertension    follow up  . Diabetes    follow up  . Back Pain    follow up   Subjective:    HPI    Follow up pancreatic cancer Hospice has been visiting twice a week, but he states they plan on cutting back to once a week as he has been stable. He is eating better than when he was on chemotherapy. Has minimal pain, taking oxycodone a few times a day. Had some constipation a few days ago, but resolved after a dose of Miralax. Has stopped Bumex since he was a little dizzy and BP was low, but has orders that he can take it prn if he gets swelling in his feet. Has had no chest pain. No shortness of breath unless he misses a dose of Singulair.    Diabetes Mellitus Type II, Follow-up:   Lab Results  Component Value Date   HGBA1C 8.8* 06/27/2015   HGBA1C 8.1* 03/15/2015    Last seen for diabetes 1 months ago.  Management since then includes none. He reports good compliance with treatment. He is not having side effects.  Current symptoms include none and have been stable. Home blood sugar records: fasting range: 130-188 He has glipizide and will take occasionally if his blood sugar runs higher than usual.  Episodes of hypoglycemia? no   Current Insulin Regimen: none Most Recent Eye Exam: <1 year Weight trend: stable Prior visit with dietician: no Current diet: in general, a "healthy" diet   Current exercise: none  Pertinent Labs:    Component Value Date/Time   CHOL 132 11/13/2014   TRIG 131 11/13/2014   HDL 46 11/13/2014   LDLCALC 60 11/13/2014   CREATININE 1.37* 08/06/2015 1138   CREATININE 1.8* 11/13/2014   CREATININE 2.16* 08/16/2014 0509    Wt Readings from Last 3 Encounters:  08/29/15 181 lb (82.101 kg)  08/06/15 182 lb (82.555 kg)  08/01/15 183 lb (83.008 kg)     ------------------------------------------------------------------------   Hypertension, follow-up:  BP Readings from Last 3 Encounters:  08/29/15 90/44  08/06/15 151/75  08/01/15 110/62    He was last seen for hypertension 1 months ago.  BP at that visit was 90/44. Management since that visit includes no changes.  He reports good compliance with treatment. He is not having side effects.  He is not exercising. He is adherent to low salt diet.   Outside blood pressures are [id number] systolic over [id number] diastolic. He is experiencing lower extremity edema.  Patient denies chest pain, chest pressure/discomfort, claudication, dyspnea, exertional chest pressure/discomfort, fatigue, irregular heart beat, near-syncope, orthopnea, palpitations, paroxysmal nocturnal dyspnea, syncope and tachypnea.   Cardiovascular risk factors include advanced age (older than 41 for men, 81 for women), diabetes mellitus, hypertension and male gender.  Use of agents associated with hypertension: NSAIDS.     Weight trend: stable Wt Readings from Last 3 Encounters:  10/10/15 179 lb (81.194 kg)  08/29/15 181 lb (82.101 kg)  08/06/15 182 lb (82.555 kg)    Current diet: in general, a "healthy" diet    ------------------------------------------------------------------------   Low Back pain: Last office visit was 08/29/2015 and no changes were made. Since last visit Percocet was increased to 7.5-325mg  due to increased pain. Since increasing medication,  patient reports his pain has decreased.      Allergies  Allergen Reactions  . Fentanyl     Dizziness   . Adhesive [Tape] Other (See Comments)    Patient states that adhesive tape causes his skin to tear very easily and asks for paper tape ONLY  . Crestor [Rosuvastatin Calcium] Rash  . Penicillins Rash   Previous Medications   ALLOPURINOL (ZYLOPRIM) 100 MG TABLET    TAKE 2 TABLETS BY MOUTH EVERY MORNING   ASPIRIN (ASPIRIN EC) 81 MG EC TABLET     Take 81 mg by mouth daily. Swallow whole.   BUMETANIDE (BUMEX) 2 MG TABLET    Take 2-4 mg by mouth daily as needed (for swelling).    CLOPIDOGREL (PLAVIX) 75 MG TABLET    Take 1 tablet by mouth  daily   COLLAGENASE (SANTYL) OINTMENT    Apply 1 application topically daily. For 10 days   CYANOCOBALAMIN (,VITAMIN B-12,) 1000 MCG/ML INJECTION    Inject 1,000 mcg into the muscle every 30 (thirty) days.   GABAPENTIN (NEURONTIN) 300 MG CAPSULE    TAKE 3 CAPSULES BY MOUTH EVERY NIGHT AT BEDTIME   GARLIC 123XX123 MG CAPS    Take 1,000 mg by mouth daily.    LEVOTHYROXINE (SYNTHROID, LEVOTHROID) 112 MCG TABLET    TAKE 1 TABLET BY MOUTH EVERY DAY   LIDOCAINE-PRILOCAINE (EMLA) CREAM    Apply 1 application topically as needed. Apply to port then cover with saran wrap 1-2 hours before chemotherapy appointment   MAGIC MOUTHWASH SOLN    Take 10 mLs by mouth 4 (four) times daily as needed for mouth pain.   MONTELUKAST (SINGULAIR) 10 MG TABLET    Take 10 mg by mouth at bedtime.   OMEGA-3 FATTY ACIDS (FISH OIL) 1000 MG CAPS    Take 1,000 mg by mouth 2 (two) times daily.    ONE TOUCH ULTRA TEST TEST STRIP    USE TO CHECK BLOOD SUGAR ONCE DAILY   OXYCODONE-ACETAMINOPHEN (PERCOCET) 7.5-325 MG TABLET    Take 1 tablet by mouth every 4 (four) hours as needed for severe pain.   PRAVASTATIN (PRAVACHOL) 40 MG TABLET    TAKE ONE TABLET BY MOUTH EVERY DAY   PROCHLORPERAZINE (COMPAZINE) 10 MG TABLET    Take 1 tablet (10 mg total) by mouth every 6 (six) hours as needed for nausea or vomiting.   RANITIDINE (ZANTAC) 150 MG TABLET    Take 150-300 mg by mouth daily.    SILVER SULFADIAZINE (SILVADENE) 1 % CREAM    Apply 1 application topically 2 (two) times daily.     Review of Systems  Constitutional: Negative for fever, chills and appetite change.  Respiratory: Negative for chest tightness, shortness of breath and wheezing.   Cardiovascular: Positive for leg swelling. Negative for chest pain and palpitations.  Gastrointestinal:  Negative for nausea, vomiting and abdominal pain.    Social History  Substance Use Topics  . Smoking status: Never Smoker   . Smokeless tobacco: Never Used  . Alcohol Use: No   Objective:   BP 132/66 mmHg  Pulse 75  Temp(Src) 98.2 F (36.8 C) (Oral)  Resp 16  Ht 6' (1.829 m)  Wt 179 lb (81.194 kg)  BMI 24.27 kg/m2  SpO2 94%  Physical Exam  General appearance: alert, well developed, well nourished, cooperative and in no distress Head: Normocephalic, without obvious abnormality, atraumatic Lungs: Respirations even and unlabored Extremities: No gross deformities Skin: Skin color, texture, turgor normal. No rashes seen  Psych: Appropriate mood and affect. Neurologic: Mental status: Alert, oriented to person, place, and time, thought content appropriate.  Results for orders placed or performed in visit on 10/10/15  POCT HgB A1C  Result Value Ref Range   Hemoglobin A1C 8.0    Est. average glucose Bld gHb Est-mCnc 183        Assessment & Plan:     1. Malignant neoplasm of tail of pancreas ([place]) Continue hospice for pain and symptomatic management.    2. Diabetes mellitus type 2 with neurological manifestations ([facility]) Sugars reasonable, only on prn glipizide. Call if they get unusually high.  - POCT HgB A1C  3. Essential (primary) hypertension Off scheduled doses of Bumex  Follow up 3 months.        Lelon Huh, MD  [facility]

## 2015-10-16 ENCOUNTER — Other Ambulatory Visit: Payer: Self-pay | Admitting: Family Medicine

## 2015-10-18 ENCOUNTER — Other Ambulatory Visit: Payer: Self-pay | Admitting: Family Medicine

## 2015-10-31 DIAGNOSIS — E119 Type 2 diabetes mellitus without complications: Secondary | ICD-10-CM | POA: Diagnosis not present

## 2015-10-31 DIAGNOSIS — E785 Hyperlipidemia, unspecified: Secondary | ICD-10-CM | POA: Diagnosis not present

## 2015-10-31 DIAGNOSIS — I70245 Atherosclerosis of native arteries of left leg with ulceration of other part of foot: Secondary | ICD-10-CM | POA: Diagnosis not present

## 2015-10-31 DIAGNOSIS — I6523 Occlusion and stenosis of bilateral carotid arteries: Secondary | ICD-10-CM | POA: Diagnosis not present

## 2015-11-14 ENCOUNTER — Other Ambulatory Visit: Payer: Self-pay

## 2015-11-14 DIAGNOSIS — I10 Essential (primary) hypertension: Secondary | ICD-10-CM | POA: Diagnosis not present

## 2015-11-14 DIAGNOSIS — L89151 Pressure ulcer of sacral region, stage 1: Secondary | ICD-10-CM | POA: Diagnosis not present

## 2015-11-14 DIAGNOSIS — E119 Type 2 diabetes mellitus without complications: Secondary | ICD-10-CM | POA: Diagnosis not present

## 2015-11-14 DIAGNOSIS — C252 Malignant neoplasm of tail of pancreas: Secondary | ICD-10-CM | POA: Diagnosis not present

## 2015-11-14 MED ORDER — OXYCODONE-ACETAMINOPHEN 7.5-325 MG PO TABS: 1.0000 | ORAL_TABLET | ORAL | Status: DC | PRN

## 2015-11-14 NOTE — Telephone Encounter (Signed)
Leslie from [facility] reports that the patient is requesting refills on Oxycodone 7.5/325mg . She reports that patient has been in a lot of pain. Please fax prescription to [facility] in [place]. Thanks!

## 2015-11-20 ENCOUNTER — Other Ambulatory Visit: Payer: Self-pay | Admitting: Family Medicine

## 2015-11-20 NOTE — Telephone Encounter (Signed)
Dr. Wanda Plump This came to Dr Darnell Level for his Montelukast Thanks

## 2015-12-11 DIAGNOSIS — H52223 Regular astigmatism, bilateral: Secondary | ICD-10-CM | POA: Diagnosis not present

## 2015-12-11 DIAGNOSIS — Z961 Presence of intraocular lens: Secondary | ICD-10-CM | POA: Diagnosis not present

## 2015-12-11 DIAGNOSIS — H524 Presbyopia: Secondary | ICD-10-CM | POA: Diagnosis not present

## 2015-12-11 DIAGNOSIS — H5203 Hypermetropia, bilateral: Secondary | ICD-10-CM | POA: Diagnosis not present

## 2015-12-11 DIAGNOSIS — E119 Type 2 diabetes mellitus without complications: Secondary | ICD-10-CM | POA: Diagnosis not present

## 2015-12-11 LAB — HM DIABETES EYE EXAM

## 2015-12-29 ENCOUNTER — Other Ambulatory Visit: Payer: Self-pay | Admitting: Family Medicine

## 2015-12-29 MED ORDER — OXYCODONE-ACETAMINOPHEN 7.5-325 MG PO TABS: 1.0000 | ORAL_TABLET | ORAL | Status: DC | PRN

## 2015-12-29 NOTE — Telephone Encounter (Signed)
Pt contacted office for refill request on the following medications:  oxyCODONE-acetaminophen (PERCOCET) 7.5-325 MG tablet.  CB#[phone]/MW

## 2016-01-09 ENCOUNTER — Ambulatory Visit (INDEPENDENT_AMBULATORY_CARE_PROVIDER_SITE_OTHER): Payer: Medicare Other | Admitting: Family Medicine

## 2016-01-09 ENCOUNTER — Other Ambulatory Visit: Payer: Self-pay | Admitting: Family Medicine

## 2016-01-09 ENCOUNTER — Encounter: Payer: Self-pay | Admitting: Family Medicine

## 2016-01-09 VITALS — BP 120/80 | HR 80 | Temp 97.4°F | Resp 16 | Ht 72.0 in | Wt 189.0 lb

## 2016-01-09 DIAGNOSIS — G629 Polyneuropathy, unspecified: Secondary | ICD-10-CM

## 2016-01-09 DIAGNOSIS — E1149 Type 2 diabetes mellitus with other diabetic neurological complication: Secondary | ICD-10-CM | POA: Diagnosis not present

## 2016-01-09 DIAGNOSIS — R1013 Epigastric pain: Secondary | ICD-10-CM | POA: Diagnosis not present

## 2016-01-09 DIAGNOSIS — E1165 Type 2 diabetes mellitus with hyperglycemia: Secondary | ICD-10-CM | POA: Diagnosis not present

## 2016-01-09 DIAGNOSIS — IMO0002 Reserved for concepts with insufficient information to code with codable children: Secondary | ICD-10-CM

## 2016-01-09 DIAGNOSIS — I10 Essential (primary) hypertension: Secondary | ICD-10-CM

## 2016-01-09 LAB — POCT GLYCOSYLATED HEMOGLOBIN (HGB A1C)
Est. average glucose Bld gHb Est-mCnc: 169
HEMOGLOBIN A1C: 7.5

## 2016-01-09 MED ORDER — GLIPIZIDE ER 5 MG PO TB24: 5.0000 mg | ORAL_TABLET | Freq: Every day | ORAL | Status: AC

## 2016-01-09 NOTE — Progress Notes (Signed)
Patient: [name] Male    DOB: 10/01/1924   82 y.o.   MRN: [id number] Visit Date: 01/09/2016  Today's Provider: Lelon Huh, MD   Chief Complaint  Patient presents with  . Follow-up  . Diabetes  . Hypertension  . Hypothyroidism  . Hyperlipidemia  . Pancreatic Cancer   Subjective:    HPI    Follow-up pancreatic cancer 10/10/2015; no changes. Continue hospice for pain and symptomatic management. States that oxycodone is working well for pain, but makes him sleepy. Follow-up for hypothyroidism from 11/12/2014; no changes.   Has restarted glipizide since sugars were running in upper 100s. Is tolerating well with no hypoglycemia. Appetite has been better.   Wt Readings from Last 3 Encounters:  01/09/16 189 lb (85.73 kg)  10/10/15 179 lb (81.194 kg)  08/29/15 181 lb (82.101 kg)     Diabetes Mellitus Type II, Follow-up:   Lab Results  Component Value Date   HGBA1C 8.0 10/10/2015   HGBA1C 8.8* 06/27/2015   HGBA1C 8.1* 03/15/2015   Last seen for diabetes 3 months ago.  Management since then includes; no changes. Glipizide prn.Marland Kitchen He reports good compliance with treatment. He is not having side effects. none Current symptoms include none and have been stable. Home blood sugar records: fasting range: 77  Episodes of hypoglycemia? no   Current Insulin Regimen: n/a Most Recent Eye Exam: 3 weeks ago Weight trend: stable Prior visit with dietician: no Current diet: well balanced Current exercise: yes  ----------------------------------------------------------------------   Hypertension, follow-up:  BP Readings from Last 3 Encounters:  01/09/16 120/80  10/10/15 132/66  08/29/15 90/44    He was last seen for hypertension 3 months ago.  BP at that visit was 132/66. Management since that visit includes; no changes, off scheduled doses of bumex.He reports good compliance with treatment. He is not having side effects. none  He is exercising. He is  adherent to low salt diet.   Outside blood pressures are 135/75. He is experiencing none.  Patient denies none.   Cardiovascular risk factors include diabetes mellitus.  Use of agents associated with hypertension: none.   ----------------------------------------------------------------------    Lipid/Cholesterol, Follow-up:   Last seen for this 1 years ago.  Management since that visit includes; no changes.  Last Lipid Panel:    Component Value Date/Time   CHOL 132 11/13/2014   TRIG 131 11/13/2014   HDL 46 11/13/2014   LDLCALC 60 11/13/2014    He reports good compliance with treatment. He is not having side effects. none  Wt Readings from Last 3 Encounters:  01/09/16 189 lb (85.73 kg)  10/10/15 179 lb (81.194 kg)  08/29/15 181 lb (82.101 kg)    ----------------------------------------------------------------------  Swelling and redness in right both legs with drainage. Home health is caring for patient's legs.    Allergies  Allergen Reactions  . Fentanyl     Dizziness   . Adhesive [Tape] Other (See Comments)    Patient states that adhesive tape causes his skin to tear very easily and asks for paper tape ONLY  . Crestor [Rosuvastatin Calcium] Rash  . Penicillins Rash   Previous Medications   ALLOPURINOL (ZYLOPRIM) 100 MG TABLET    TAKE 2 TABLETS BY MOUTH EVERY MORNING   ASPIRIN (ASPIRIN EC) 81 MG EC TABLET    Take 81 mg by mouth daily. Swallow whole.   BUMETANIDE (BUMEX) 2 MG TABLET    TAKE 1 TO 2 TABLETS BY MOUTH DAILY AS NEEDED  FOR SWELLING   CLOPIDOGREL (PLAVIX) 75 MG TABLET    Take 1 tablet by mouth  daily   COLLAGENASE (SANTYL) OINTMENT    Apply 1 application topically daily. For 10 days   CYANOCOBALAMIN (,VITAMIN B-12,) 1000 MCG/ML INJECTION    Inject 1,000 mcg into the muscle every 30 (thirty) days.   GABAPENTIN (NEURONTIN) 300 MG CAPSULE    TAKE 3 CAPSULES BY MOUTH EVERY NIGHT AT BEDTIME   GARLIC 123XX123 MG CAPS    Take 1,000 mg by mouth daily.     LEVOTHYROXINE (SYNTHROID, LEVOTHROID) 112 MCG TABLET    TAKE 1 TABLET BY MOUTH EVERY DAY   LIDOCAINE-PRILOCAINE (EMLA) CREAM    Apply 1 application topically as needed. Apply to port then cover with saran wrap 1-2 hours before chemotherapy appointment   MAGIC MOUTHWASH SOLN    Take 10 mLs by mouth 4 (four) times daily as needed for mouth pain.   MONTELUKAST (SINGULAIR) 10 MG TABLET    TAKE ONE TABLET BY MOUTH EVERY DAY   OMEGA-3 FATTY ACIDS (FISH OIL) 1000 MG CAPS    Take 1,000 mg by mouth 2 (two) times daily.    ONE TOUCH ULTRA TEST TEST STRIP    USE TO CHECK BLOOD SUGAR ONCE DAILY   OXYCODONE-ACETAMINOPHEN (PERCOCET) 7.5-325 MG TABLET    Take 1 tablet by mouth every 4 (four) hours as needed for severe pain.   PRAVASTATIN (PRAVACHOL) 40 MG TABLET    TAKE ONE TABLET BY MOUTH EVERY DAY   PROCHLORPERAZINE (COMPAZINE) 10 MG TABLET    Take 1 tablet (10 mg total) by mouth every 6 (six) hours as needed for nausea or vomiting.   RANITIDINE (ZANTAC) 150 MG TABLET    Take 150-300 mg by mouth daily.    SILVER SULFADIAZINE (SILVADENE) 1 % CREAM    Apply 1 application topically 2 (two) times daily.     Review of Systems  Constitutional: Negative for fever, chills and appetite change.  Respiratory: Negative for chest tightness, shortness of breath and wheezing.   Cardiovascular: Positive for leg swelling. Negative for chest pain and palpitations.  Gastrointestinal: Negative for nausea, vomiting and abdominal pain.    Social History  Substance Use Topics  . Smoking status: Never Smoker   . Smokeless tobacco: Never Used  . Alcohol Use: No   Objective:   BP 120/80 mmHg  Pulse 80  Temp(Src) 97.4 F (36.3 C) (Oral)  Resp 16  Ht 6' (1.829 m)  Wt 189 lb (85.73 kg)  BMI 25.63 kg/m2  SpO2 96%  Physical Exam   General Appearance:    Alert, cooperative, no distress  Eyes:    PERRL, conjunctiva/corneas clear, EOM's intact       Lungs:     Clear to auscultation bilaterally, respirations unlabored    Heart:    Regular rate and rhythm  Neurologic:   Awake, alert, oriented x 3. No apparent focal neurological           defect.        Results for orders placed or performed in visit on 01/09/16  POCT glycosylated hemoglobin (Hb A1C)  Result Value Ref Range   Hemoglobin A1C 7.5    Est. average glucose Bld gHb Est-mCnc 169        Assessment & Plan:     1. Diabetes mellitus with neurological manifestations, uncontrolled ([place]) Well controlled. Continue current medications.  - POCT glycosylated hemoglobin (Hb A1C) - glipiZIDE (GLIPIZIDE XL) 5 MG 24 hr tablet; Take  1-2 tablets (5-10 mg total) by mouth daily.  Dispense: 60 tablet; Refill: 3  2. Chronic pain Is doing well with current pain medication regiment.   3. Essential (primary) hypertension Well controlled.  Continue current medications.         Lelon Huh, MD  [facility]

## 2016-01-17 ENCOUNTER — Other Ambulatory Visit: Payer: Self-pay | Admitting: Family Medicine

## 2016-01-23 ENCOUNTER — Encounter: Payer: Self-pay | Admitting: *Deleted

## 2016-01-29 ENCOUNTER — Other Ambulatory Visit: Payer: Self-pay | Admitting: Family Medicine

## 2016-01-29 NOTE — Telephone Encounter (Signed)
Pt contacted office for refill request on the following medications:  oxyCODONE-acetaminophen (PERCOCET) 7.5-325 MG tablet.  Fax to [facility] due to being a Hospice pt.  [phone]

## 2016-01-30 MED ORDER — OXYCODONE-ACETAMINOPHEN 7.5-325 MG PO TABS: 1.0000 | ORAL_TABLET | ORAL | Status: DC | PRN

## 2016-01-30 NOTE — Addendum Note (Signed)
Addended by: Julieta Bellini on: 01/30/2016 03:47 PM   Modules accepted: Orders

## 2016-02-03 ENCOUNTER — Other Ambulatory Visit: Payer: Self-pay | Admitting: Family Medicine

## 2016-02-06 DIAGNOSIS — I10 Essential (primary) hypertension: Secondary | ICD-10-CM | POA: Diagnosis not present

## 2016-02-06 DIAGNOSIS — L89151 Pressure ulcer of sacral region, stage 1: Secondary | ICD-10-CM | POA: Diagnosis not present

## 2016-02-06 DIAGNOSIS — C252 Malignant neoplasm of tail of pancreas: Secondary | ICD-10-CM | POA: Diagnosis not present

## 2016-02-06 DIAGNOSIS — E119 Type 2 diabetes mellitus without complications: Secondary | ICD-10-CM | POA: Diagnosis not present

## 2016-02-18 ENCOUNTER — Ambulatory Visit (INDEPENDENT_AMBULATORY_CARE_PROVIDER_SITE_OTHER): Admitting: Family Medicine

## 2016-02-18 ENCOUNTER — Encounter: Payer: Self-pay | Admitting: Family Medicine

## 2016-02-18 VITALS — BP 130/90 | HR 82 | Temp 97.5°F | Resp 18 | Ht 72.0 in | Wt 202.0 lb

## 2016-02-18 DIAGNOSIS — R1013 Epigastric pain: Secondary | ICD-10-CM | POA: Diagnosis not present

## 2016-02-18 DIAGNOSIS — R42 Dizziness and giddiness: Secondary | ICD-10-CM | POA: Diagnosis not present

## 2016-02-18 NOTE — Progress Notes (Signed)
Patient: [name] Male    DOB: 1925-01-29   82 y.o.   MRN: [id number] Visit Date: 02/18/2016  Today's Provider: Lelon Huh, MD   Chief Complaint  Patient presents with  . Dizziness  . Nausea   Subjective:    Dizziness This is a new problem. The current episode started yesterday. The problem occurs constantly. The problem has been gradually worsening. Associated symptoms include anorexia, myalgias (in legs), nausea, numbness (in feet) and weakness. Pertinent negatives include no chest pain, chills, congestion, coughing, diaphoresis, fatigue, fever or vomiting.  Patient describes dizziness as a sense of being off balance. Dizziness worsens when standing from a sitting position. He has had significant decline in appetite the last couple of days. Has has more pain than usual in upper abdomen. Is still drinking fluids. His blood sugar has been labile, between low 100s and low 200s. No change in bowels,frequency, consistency, or color.     Wt Readings from Last 3 Encounters:  02/18/16 202 lb (91.627 kg)  01/09/16 189 lb (85.73 kg)  10/10/15 179 lb (81.194 kg)     Allergies  Allergen Reactions  . Fentanyl     Dizziness   . Adhesive [Tape] Other (See Comments)    Patient states that adhesive tape causes his skin to tear very easily and asks for paper tape ONLY  . Crestor [Rosuvastatin Calcium] Rash  . Penicillins Rash   Current Meds  Medication Sig  . allopurinol (ZYLOPRIM) 100 MG tablet TAKE 2 TABLETS BY MOUTH EVERY MORNING  . aspirin (ASPIRIN EC) 81 MG EC tablet Take 81 mg by mouth daily. Swallow whole.  . bumetanide (BUMEX) 2 MG tablet TAKE 1 TO 2 TABLETS BY MOUTH DAILY AS NEEDED FOR SWELLING  . clopidogrel (PLAVIX) 75 MG tablet Take 1 tablet by mouth  daily  . collagenase (SANTYL) ointment Apply 1 application topically daily. For 10 days  . cyanocobalamin (,VITAMIN B-12,) 1000 MCG/ML injection Inject 1,000 mcg into the muscle every 30 (thirty) days.  Marland Kitchen  gabapentin (NEURONTIN) 300 MG capsule TAKE 3 CAPSULES BY MOUTH EVERY NIGHT AT BEDTIME  . Garlic 123XX123 MG CAPS Take 1,000 mg by mouth daily.   Marland Kitchen levothyroxine (SYNTHROID, LEVOTHROID) 112 MCG tablet TAKE 1 TABLET BY MOUTH EVERY DAY  . lidocaine-prilocaine (EMLA) cream Apply 1 application topically as needed. Apply to port then cover with saran wrap 1-2 hours before chemotherapy appointment  . magic mouthwash SOLN Take 10 mLs by mouth 4 (four) times daily as needed for mouth pain.  . montelukast (SINGULAIR) 10 MG tablet TAKE ONE TABLET BY MOUTH EVERY DAY  . Omega-3 Fatty Acids (FISH OIL) 1000 MG CAPS Take 1,000 mg by mouth 2 (two) times daily.   . ONE TOUCH ULTRA TEST test strip USE AS DIRECTED TWICE DAILY  . oxyCODONE-acetaminophen (PERCOCET) 7.5-325 MG tablet Take 1 tablet by mouth every 4 (four) hours as needed for severe pain.  . pravastatin (PRAVACHOL) 40 MG tablet TAKE ONE TABLET BY MOUTH EVERY DAY  . prochlorperazine (COMPAZINE) 10 MG tablet Take 1 tablet (10 mg total) by mouth every 6 (six) hours as needed for nausea or vomiting.  . ranitidine (ZANTAC) 150 MG tablet Take 150-300 mg by mouth daily.   . silver sulfADIAZINE (SILVADENE) 1 % cream Apply 1 application topically 2 (two) times daily.     Review of Systems  Constitutional: Positive for appetite change (decrease in appettite). Negative for fever, chills, diaphoresis and fatigue.  HENT: Positive for  postnasal drip and rhinorrhea. Negative for congestion, ear discharge, ear pain, nosebleeds and trouble swallowing.   Eyes: Positive for visual disturbance (blurred vision).  Respiratory: Negative for cough, chest tightness, shortness of breath and wheezing.   Cardiovascular: Negative for chest pain and palpitations.  Gastrointestinal: Positive for nausea and anorexia. Negative for vomiting.  Genitourinary: Positive for frequency. Negative for dysuria.  Musculoskeletal: Positive for myalgias (in legs).  Neurological: Positive for  dizziness, weakness and numbness (in feet).    Social History  Substance Use Topics  . Smoking status: Never Smoker   . Smokeless tobacco: Never Used  . Alcohol Use: No   Objective:   BP 130/90 mmHg  Pulse 82  Temp(Src) 97.5 F (36.4 C) (Oral)  Resp 18  Ht 6' (1.829 m)  Wt 202 lb (91.627 kg)  BMI 27.39 kg/m2  SpO2 93%  Physical Exam   General Appearance:    Alert, cooperative, no distress  Eyes:    PERRL, conjunctiva/corneas clear, EOM's intact       Lungs:     Clear to auscultation bilaterally, respirations unlabored  Heart:    Regular rate and rhythm  Neurologic:   Awake, alert, oriented x 3. No apparent focal neurological           defect. Negative Rhomberg. Difficulty standing from sitting position due to LE weakness. No nystagmus. Able to stand without assistance, but requires walker to ambulate.   Ext:   2 + bilateral pitting edema. To mid lower leg.        Assessment & Plan:     1. Dizziness  - CBC - Comprehensive metabolic panel  2. Epigastric pain  - Amylase  Keep Plavix, aspirin, bumex, and glipizide on hold until lab results are back Encourage Boost or Ensure between meals.       Lelon Huh, MD  [facility]

## 2016-02-18 NOTE — Patient Instructions (Addendum)
Do not take aspirin, clopidogrel, bumetadine, or glipizide until we call you back with the results of your blood tests

## 2016-02-19 ENCOUNTER — Telehealth: Payer: Self-pay | Admitting: Family Medicine

## 2016-02-19 LAB — CBC
HEMATOCRIT: 45.1 % (ref 37.5–51.0)
Hemoglobin: 14.4 g/dL (ref 12.6–17.7)
MCH: 31.6 pg (ref 26.6–33.0)
MCHC: 31.9 g/dL (ref 31.5–35.7)
MCV: 99 fL — AB (ref 79–97)
PLATELETS: 102 10*3/uL — AB (ref 150–379)
RBC: 4.56 x10E6/uL (ref 4.14–5.80)
RDW: 16.2 % — ABNORMAL HIGH (ref 12.3–15.4)
WBC: 6.4 10*3/uL (ref 3.4–10.8)

## 2016-02-19 LAB — COMPREHENSIVE METABOLIC PANEL
A/G RATIO: 2 (ref 1.2–2.2)
ALT: 10 IU/L (ref 0–44)
AST: 23 IU/L (ref 0–40)
Albumin: 4.2 g/dL (ref 3.5–4.7)
Alkaline Phosphatase: 119 IU/L — ABNORMAL HIGH (ref 39–117)
BUN/Creatinine Ratio: 12 (ref 10–24)
BUN: 17 mg/dL (ref 8–27)
Bilirubin Total: 1.2 mg/dL (ref 0.0–1.2)
CALCIUM: 9.5 mg/dL (ref 8.6–10.2)
CO2: 32 mmol/L — ABNORMAL HIGH (ref 18–29)
Chloride: 92 mmol/L — ABNORMAL LOW (ref 96–106)
Creatinine, Ser: 1.37 mg/dL — ABNORMAL HIGH (ref 0.76–1.27)
GFR, EST AFRICAN AMERICAN: 53 mL/min/{1.73_m2} — AB (ref >59)
GFR, EST NON AFRICAN AMERICAN: 46 mL/min/{1.73_m2} — AB (ref >59)
Globulin, Total: 2.1 g/dL (ref 1.5–4.5)
Glucose: 142 mg/dL — ABNORMAL HIGH (ref 65–99)
POTASSIUM: 5 mmol/L (ref 3.5–5.2)
Sodium: 139 mmol/L (ref 134–144)
TOTAL PROTEIN: 6.3 g/dL (ref 6.0–8.5)

## 2016-02-19 LAB — AMYLASE: Amylase: 32 U/L (ref 31–124)

## 2016-02-20 NOTE — Telephone Encounter (Signed)
Patient stated that his still has abd pain. Patient stated that he would hold off on getting abdominal US right now the pain medications is helping keep the pain in control.

## 2016-02-24 ENCOUNTER — Telehealth: Payer: Self-pay | Admitting: Family Medicine

## 2016-02-24 ENCOUNTER — Other Ambulatory Visit: Payer: Self-pay | Admitting: Family Medicine

## 2016-02-24 DIAGNOSIS — I6523 Occlusion and stenosis of bilateral carotid arteries: Secondary | ICD-10-CM | POA: Diagnosis not present

## 2016-02-24 DIAGNOSIS — I10 Essential (primary) hypertension: Secondary | ICD-10-CM | POA: Diagnosis not present

## 2016-02-24 DIAGNOSIS — E785 Hyperlipidemia, unspecified: Secondary | ICD-10-CM | POA: Diagnosis not present

## 2016-02-24 MED ORDER — OXYCODONE-ACETAMINOPHEN 7.5-325 MG PO TABS: 1.0000 | ORAL_TABLET | ORAL | Status: AC | PRN

## 2016-02-24 NOTE — Telephone Encounter (Signed)
Magda Paganini with Hospice calling stating pt needs a refill on  oxyCODONE-acetaminophen (PERCOCET) 7.5-325 MG tablet @ [facility]. Magda Paganini states pt only has enough for today.  CB# [phone].  Thanks [facility]

## 2016-02-24 NOTE — Telephone Encounter (Signed)
Left a message request a refill on oxyCODONE-acetaminophen (PERCOCET) 7.5-325 MG tablet.

## 2016-02-24 NOTE — Telephone Encounter (Signed)
Refill request has already been sent to pcp.

## 2016-04-13 DEATH — deceased

## 2016-04-14 ENCOUNTER — Ambulatory Visit: Payer: Self-pay | Admitting: Family Medicine

## 2016-08-27 ENCOUNTER — Other Ambulatory Visit: Payer: Self-pay | Admitting: Nurse Practitioner

## 2017-05-25 IMAGING — CR DG CHEST 1V
1 series · 1 of 1 positions shown · non-contrast
Comparison: Chest x-ray of 04/24/2015

CLINICAL DATA: Post pacemaker insertion, history of hypertension

EXAM:
CHEST  1 VIEW

[ap]
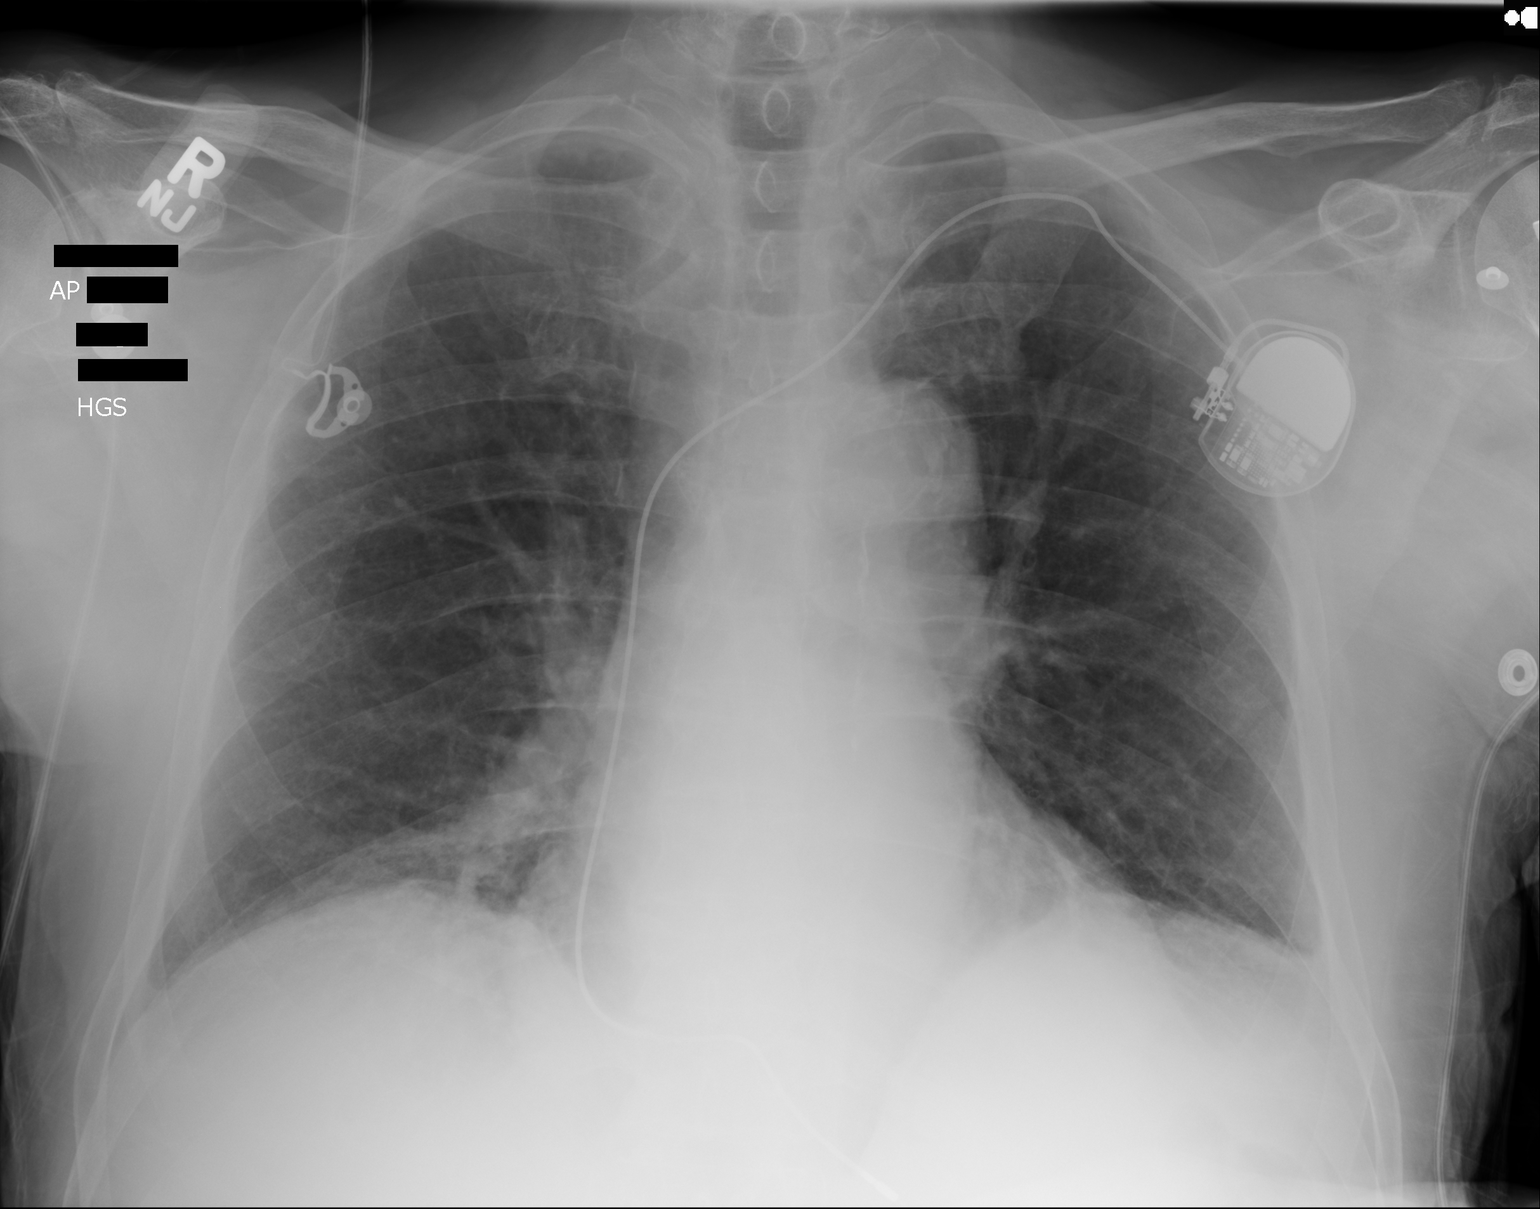

[1 of 1 positions shown; findings below may reference images not displayed]

FINDINGS: No active infiltrate or effusion is seen. Minimally prominent
markings remain at the lung bases consistent with linear atelectasis
or scarring. A single lead permanent pacemaker is now noted with the
tip extending toward the apex of the right ventricle. No
pneumothorax is seen. Mild cardiomegaly is stable.
IMPRESSION: Single lead permanent pacemaker tip extends toward the apex of the
right ventricle. No pneumothorax.

## 2017-06-02 IMAGING — CT CT ABD-PELV W/O CM
1 of 2 series · 14 of 32 positions shown, 18 images · non-contrast
Comparison: PET-CT dated 03/31/2015. CT abdomen pelvis dated
03/14/2015.

CLINICAL DATA: Status post EUS, abdominal pain, evaluate for
perforation at cardia of stomach

EXAM:
CT ABDOMEN AND PELVIS WITHOUT CONTRAST
TECHNIQUE: Multidetector CT imaging of the abdomen and pelvis was performed
following the standard protocol without IV contrast.

[Series 2: routine abd pel without · axial · non-contrast · 0.79mm/px · z∈[-1063,-603]mm · 14 of 106 slices shown, 18 images]
[im 9/106  soft-tissue]
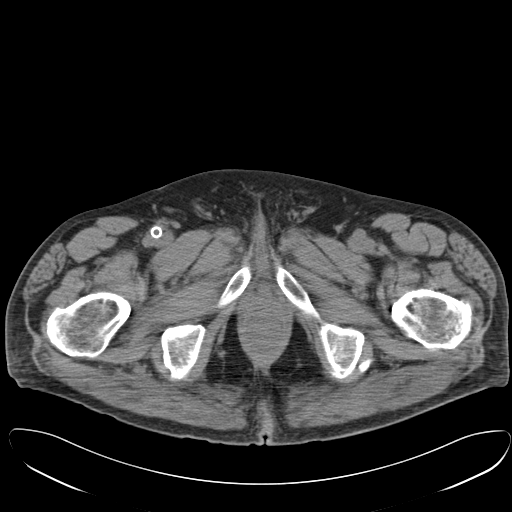
[im 9/106  bone]
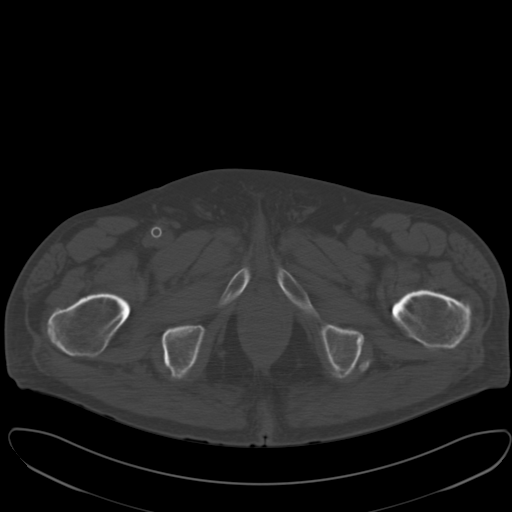
[im 17/106  soft-tissue]
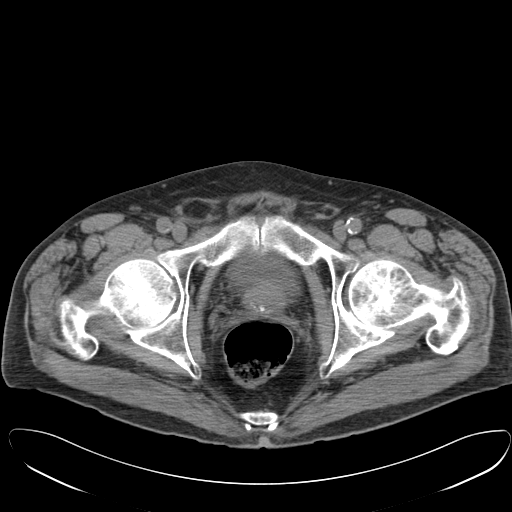
[im 26/106  soft-tissue]
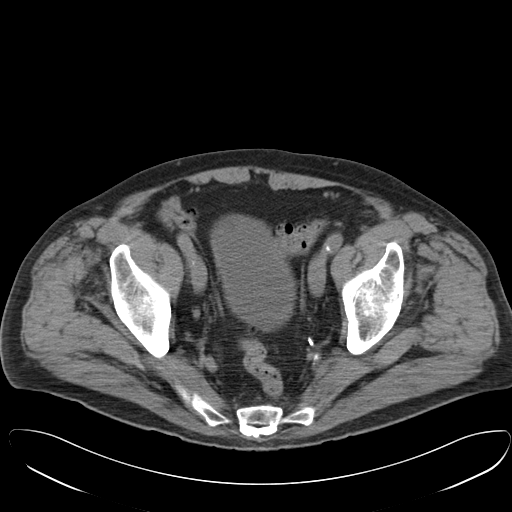
[im 34/106  soft-tissue]
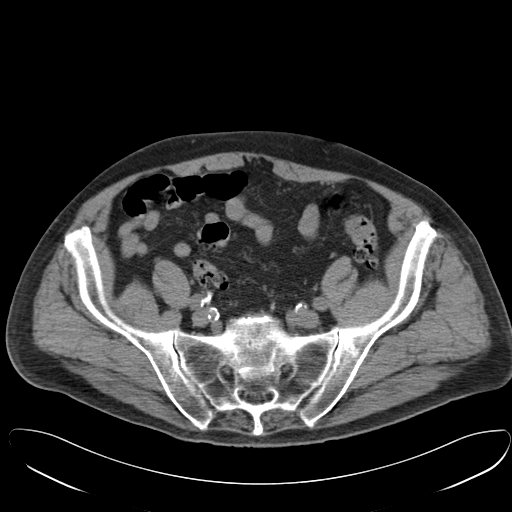
[im 43/106  soft-tissue]
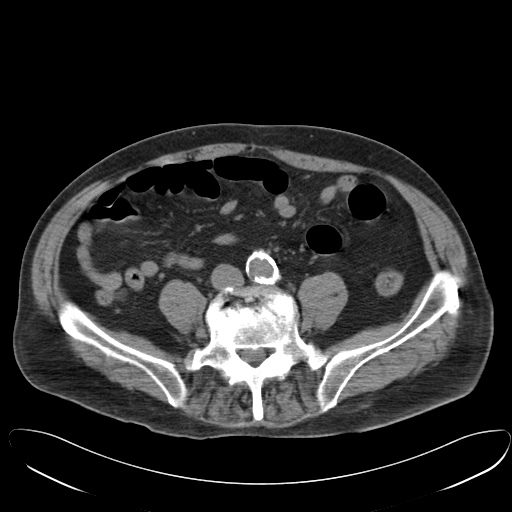
[im 51/106  soft-tissue]
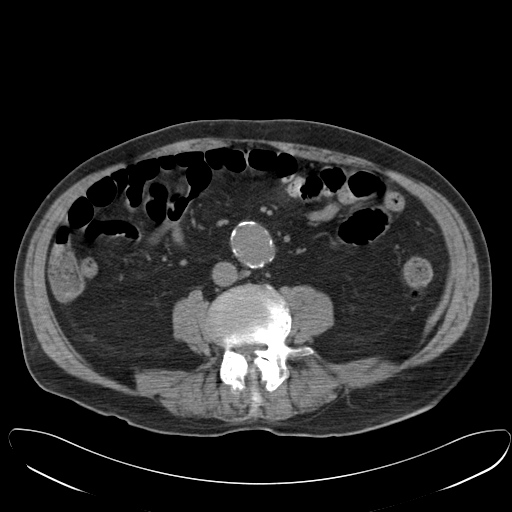
[im 59/106  soft-tissue]
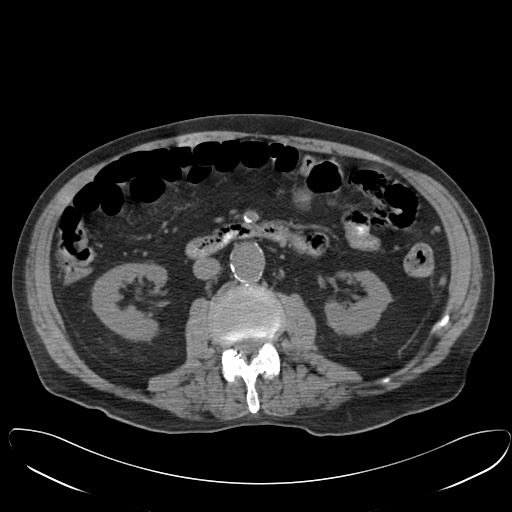
[im 68/106  soft-tissue]
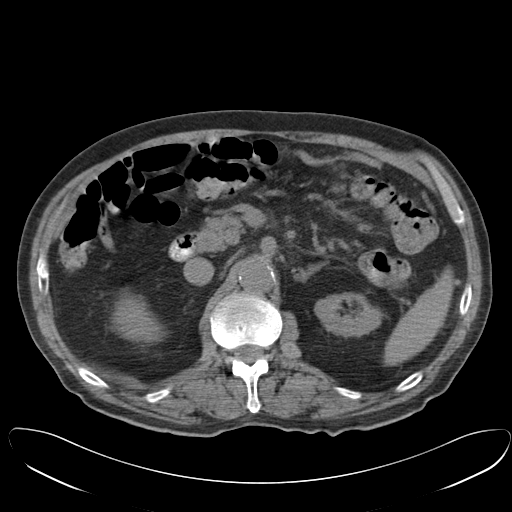
[im 76/106  soft-tissue]
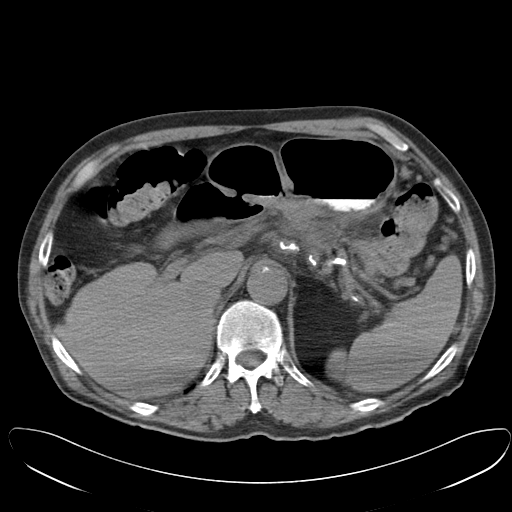
[im 76/106  bone]
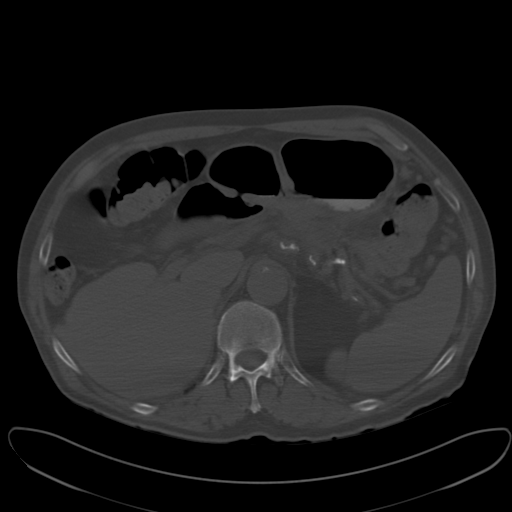
[im 85/106  soft-tissue]
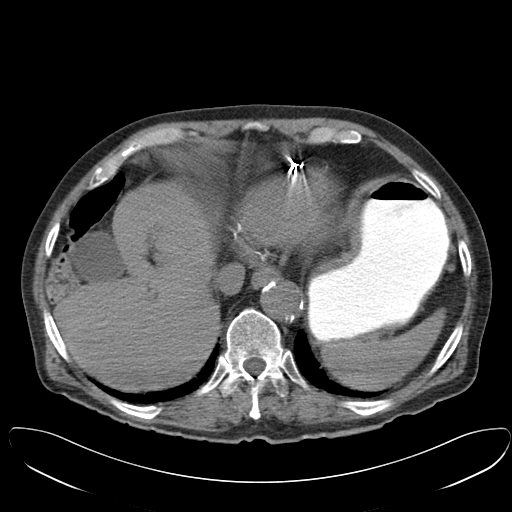
[im 89/106  lung]
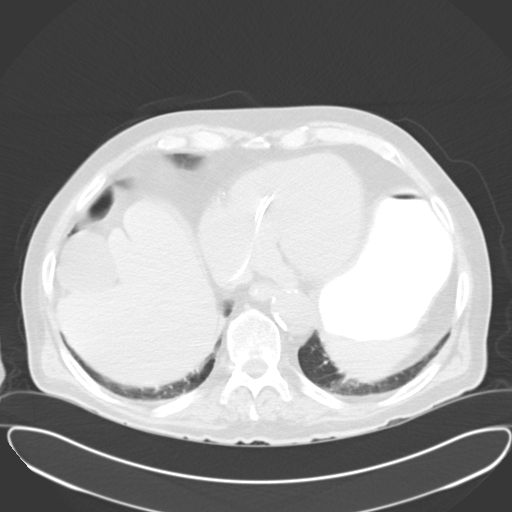
[im 93/106  soft-tissue]
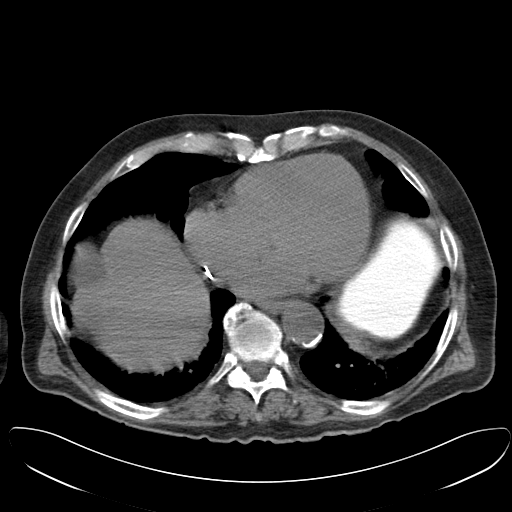
[im 93/106  lung]
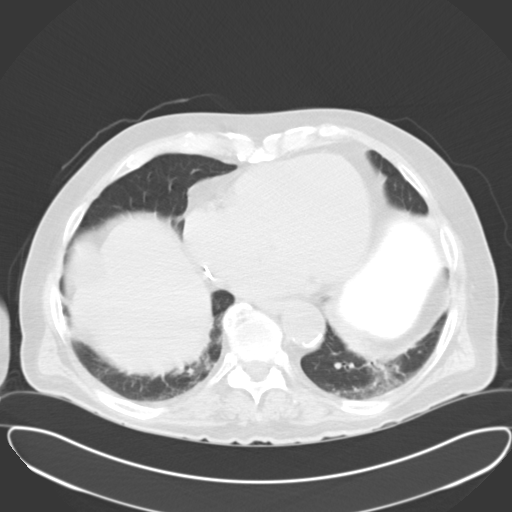
[im 97/106  lung]
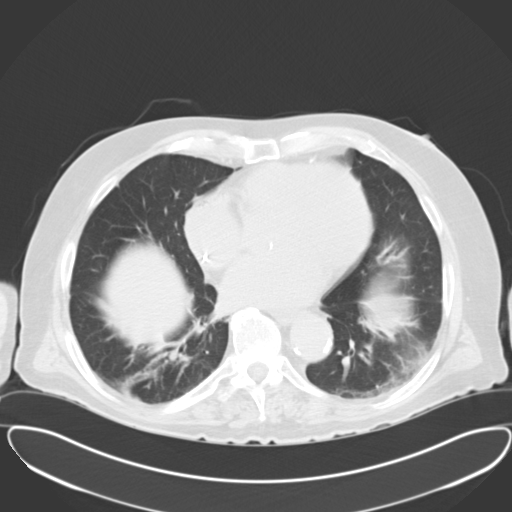
[im 101/106  soft-tissue]
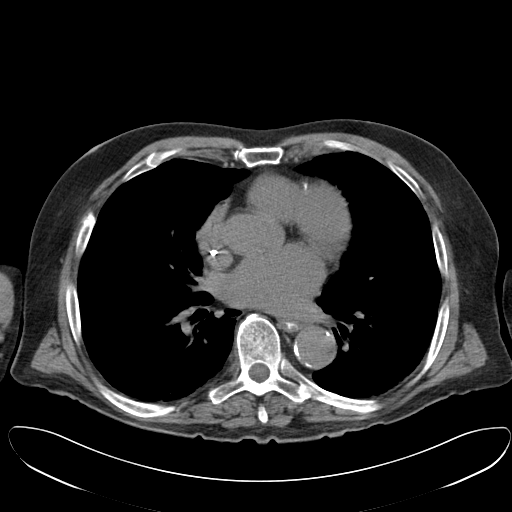
[im 101/106  lung]
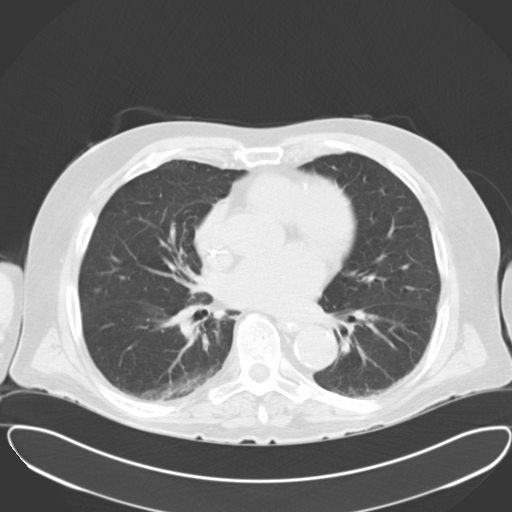

[14 of 32 positions shown; findings below may reference images not displayed]

FINDINGS: Lower chest:  Mild dependent atelectasis the lung bases.

Cardiomegaly.  Pacemaker leads, incompletely visualized.

Hepatobiliary: Unenhanced liver is grossly unremarkable.

Gallbladder is unremarkable. No intrahepatic or extrahepatic ductal
dilatation.

Pancreas: 6.0 x 5.9 cm mass in the distal pancreatic body/ tail
(series 2/ image 34), poorly evaluated on unenhanced CT,
corresponding to suspected primary pancreatic neoplasm.

Mass tethers/involves the posterior wall of the stomach (series
2/image 30) and also tethers/involves the descending colon (series
2/image 31).

Mass abuts the celiac axis (series 2/ image 34). Splenic vein is
occluded with collateralization. Mass abuts the SMV (series 2/image
37).

Spleen: Within normal limits.

Adrenals/Urinary Tract: Adrenal glands are unremarkable.

Left renal atrophy.  Right kidney is unremarkable.

No renal, ureteral, or bladder calculi.

Bladder is within normal limits.

Stomach/Bowel: Contrast within the stomach. Surgical clips in the
region of the gastric cardia (series 2/ image 24). No extraluminal
contrast or free air to suggest gastric perforation.

No evidence of bowel obstruction.

Colonic diverticulosis, without evidence of diverticulitis.

Vascular/Lymphatic: Atherosclerotic calcifications of the abdominal
aorta and branch vessels.

3.6 x 3.5 cm infrarenal abdominal aortic aneurysm (series 2/ image
57).

No suspicious abdominopelvic lymphadenopathy.

Reproductive: Prostate is notable for dystrophic calcifications.

Other: No abdominopelvic ascites.

Small fat containing right inguinal hernia.

Musculoskeletal: Degenerative changes of the visualized
thoracolumbar spine.
IMPRESSION: Surgical clips in the region of the gastric cardia. No extraluminal
contrast or free air to suggest gastric perforation.

Stable distal pancreatic mass, corresponding to suspected primary
pancreatic neoplasm. Secondary involvement of the posterior stomach
and adjacent descending colon. Suspected vascular involvement, as
above.

Stable 3.6 cm infrarenal abdominal aortic aneurysm.

Additional ancillary findings as above.
